# Patient Record
Sex: Female | Born: 1980
Health system: Southern US, Community
[De-identification: ages and names within clinical notes are randomized; demographics above are authoritative.]

## PROBLEM LIST (undated history)

## (undated) DIAGNOSIS — I10 Essential (primary) hypertension: Secondary | ICD-10-CM

## (undated) DIAGNOSIS — M1990 Unspecified osteoarthritis, unspecified site: Secondary | ICD-10-CM

## (undated) DIAGNOSIS — G4730 Sleep apnea, unspecified: Secondary | ICD-10-CM

## (undated) HISTORY — DX: Unspecified osteoarthritis, unspecified site: M19.90

---

## 2000-10-20 DIAGNOSIS — N879 Dysplasia of cervix uteri, unspecified: Secondary | ICD-10-CM | POA: Insufficient documentation

## 2003-02-17 DIAGNOSIS — D539 Nutritional anemia, unspecified: Secondary | ICD-10-CM | POA: Insufficient documentation

## 2003-02-17 DIAGNOSIS — E789 Disorder of lipoprotein metabolism, unspecified: Secondary | ICD-10-CM | POA: Insufficient documentation

## 2003-02-17 HISTORY — DX: Disorder of lipoprotein metabolism, unspecified: E78.9

## 2006-07-23 DIAGNOSIS — N949 Unspecified condition associated with female genital organs and menstrual cycle: Secondary | ICD-10-CM

## 2006-07-23 DIAGNOSIS — S339XXA Sprain of unspecified parts of lumbar spine and pelvis, initial encounter: Secondary | ICD-10-CM

## 2006-07-23 DIAGNOSIS — R87619 Unspecified abnormal cytological findings in specimens from cervix uteri: Secondary | ICD-10-CM

## 2006-07-23 HISTORY — DX: Sprain of unspecified parts of lumbar spine and pelvis, initial encounter: S33.9XXA

## 2006-07-23 HISTORY — DX: Unspecified condition associated with female genital organs and menstrual cycle: N94.9

## 2006-07-23 HISTORY — DX: Unspecified abnormal cytological findings in specimens from cervix uteri: R87.619

## 2008-03-24 HISTORY — PX: TUBAL LIGATION: SHX77

## 2015-02-26 DIAGNOSIS — I10 Essential (primary) hypertension: Secondary | ICD-10-CM | POA: Insufficient documentation

## 2015-09-05 DIAGNOSIS — M5432 Sciatica, left side: Secondary | ICD-10-CM

## 2015-09-05 DIAGNOSIS — M5430 Sciatica, unspecified side: Secondary | ICD-10-CM | POA: Insufficient documentation

## 2015-09-05 HISTORY — DX: Sciatica, left side: M54.32

## 2016-04-24 DIAGNOSIS — E559 Vitamin D deficiency, unspecified: Secondary | ICD-10-CM | POA: Insufficient documentation

## 2016-06-24 DIAGNOSIS — F330 Major depressive disorder, recurrent, mild: Secondary | ICD-10-CM

## 2016-06-24 HISTORY — DX: Major depressive disorder, recurrent, mild: F33.0

## 2016-07-31 DIAGNOSIS — M47817 Spondylosis without myelopathy or radiculopathy, lumbosacral region: Secondary | ICD-10-CM | POA: Insufficient documentation

## 2016-10-23 DIAGNOSIS — E782 Mixed hyperlipidemia: Secondary | ICD-10-CM | POA: Insufficient documentation

## 2017-05-07 DIAGNOSIS — F413 Other mixed anxiety disorders: Secondary | ICD-10-CM | POA: Insufficient documentation

## 2017-05-07 DIAGNOSIS — F411 Generalized anxiety disorder: Secondary | ICD-10-CM | POA: Insufficient documentation

## 2017-05-07 DIAGNOSIS — F419 Anxiety disorder, unspecified: Secondary | ICD-10-CM

## 2017-05-07 HISTORY — DX: Anxiety disorder, unspecified: F41.9

## 2018-11-24 DIAGNOSIS — M5136 Other intervertebral disc degeneration, lumbar region: Secondary | ICD-10-CM | POA: Insufficient documentation

## 2019-02-28 DIAGNOSIS — N921 Excessive and frequent menstruation with irregular cycle: Secondary | ICD-10-CM | POA: Insufficient documentation

## 2019-02-28 DIAGNOSIS — N920 Excessive and frequent menstruation with regular cycle: Secondary | ICD-10-CM

## 2019-02-28 HISTORY — DX: Excessive and frequent menstruation with regular cycle: N92.0

## 2019-04-12 DIAGNOSIS — D500 Iron deficiency anemia secondary to blood loss (chronic): Secondary | ICD-10-CM | POA: Insufficient documentation

## 2019-04-12 DIAGNOSIS — D219 Benign neoplasm of connective and other soft tissue, unspecified: Secondary | ICD-10-CM | POA: Insufficient documentation

## 2019-05-06 HISTORY — PX: MYOMECTOMY: SHX85

## 2019-08-29 DIAGNOSIS — D259 Leiomyoma of uterus, unspecified: Secondary | ICD-10-CM | POA: Insufficient documentation

## 2019-10-17 DIAGNOSIS — G8929 Other chronic pain: Secondary | ICD-10-CM

## 2019-10-17 DIAGNOSIS — M5442 Lumbago with sciatica, left side: Secondary | ICD-10-CM

## 2019-10-17 HISTORY — DX: Lumbago with sciatica, left side: M54.42

## 2019-10-17 HISTORY — DX: Other chronic pain: G89.29

## 2020-07-10 DIAGNOSIS — N946 Dysmenorrhea, unspecified: Secondary | ICD-10-CM | POA: Diagnosis not present

## 2020-07-10 DIAGNOSIS — B9689 Other specified bacterial agents as the cause of diseases classified elsewhere: Secondary | ICD-10-CM | POA: Diagnosis not present

## 2020-07-10 DIAGNOSIS — N938 Other specified abnormal uterine and vaginal bleeding: Secondary | ICD-10-CM | POA: Diagnosis not present

## 2020-07-10 DIAGNOSIS — N760 Acute vaginitis: Secondary | ICD-10-CM | POA: Diagnosis not present

## 2020-07-12 DIAGNOSIS — A549 Gonococcal infection, unspecified: Secondary | ICD-10-CM | POA: Diagnosis not present

## 2020-07-12 DIAGNOSIS — N730 Acute parametritis and pelvic cellulitis: Secondary | ICD-10-CM | POA: Diagnosis not present

## 2020-07-12 DIAGNOSIS — R1030 Lower abdominal pain, unspecified: Secondary | ICD-10-CM | POA: Diagnosis not present

## 2020-07-12 DIAGNOSIS — I10 Essential (primary) hypertension: Secondary | ICD-10-CM | POA: Diagnosis not present

## 2020-07-12 DIAGNOSIS — Z20822 Contact with and (suspected) exposure to covid-19: Secondary | ICD-10-CM | POA: Diagnosis not present

## 2020-07-12 DIAGNOSIS — R110 Nausea: Secondary | ICD-10-CM | POA: Diagnosis not present

## 2020-07-12 DIAGNOSIS — R109 Unspecified abdominal pain: Secondary | ICD-10-CM | POA: Diagnosis not present

## 2020-07-12 DIAGNOSIS — N739 Female pelvic inflammatory disease, unspecified: Secondary | ICD-10-CM | POA: Diagnosis not present

## 2020-07-12 HISTORY — DX: Acute parametritis and pelvic cellulitis: N73.0

## 2020-07-16 DIAGNOSIS — N888 Other specified noninflammatory disorders of cervix uteri: Secondary | ICD-10-CM | POA: Diagnosis not present

## 2020-07-16 DIAGNOSIS — N739 Female pelvic inflammatory disease, unspecified: Secondary | ICD-10-CM | POA: Diagnosis not present

## 2020-07-16 DIAGNOSIS — N730 Acute parametritis and pelvic cellulitis: Secondary | ICD-10-CM | POA: Diagnosis not present

## 2020-09-10 DIAGNOSIS — H1045 Other chronic allergic conjunctivitis: Secondary | ICD-10-CM | POA: Diagnosis not present

## 2020-09-10 DIAGNOSIS — H5210 Myopia, unspecified eye: Secondary | ICD-10-CM | POA: Diagnosis not present

## 2020-09-10 DIAGNOSIS — Z135 Encounter for screening for eye and ear disorders: Secondary | ICD-10-CM | POA: Diagnosis not present

## 2020-09-11 ENCOUNTER — Other Ambulatory Visit: Payer: Self-pay

## 2020-09-11 ENCOUNTER — Ambulatory Visit: Payer: 59 | Admitting: Adult Health

## 2020-09-11 VITALS — BP 120/80 | HR 68 | Temp 98.3°F | Wt 169.8 lb

## 2020-09-11 DIAGNOSIS — M545 Low back pain, unspecified: Secondary | ICD-10-CM

## 2020-09-11 MED ORDER — METHYLPREDNISOLONE ACETATE 80 MG/ML IJ SUSP
80.0000 mg | Freq: Once | INTRAMUSCULAR | Status: AC
Start: 2020-09-11 — End: 2020-09-11
  Administered 2020-09-11: 80 mg via INTRAMUSCULAR

## 2020-09-11 MED ORDER — CYCLOBENZAPRINE HCL 5 MG PO TABS: 5.0000 mg | ORAL_TABLET | Freq: Every day | ORAL | 0 refills | Status: DC

## 2020-09-11 NOTE — Progress Notes (Signed)
   Subjective:    Patient ID: Caitlyn Richardson, female    DOB: December 05, 1980, 40 y.o.   MRN: [id number]  HPI  40 year old female who  has no past medical history on file.  She presents to the office today for low back pain. Pain started this morning when bending over at work. She felt a stretching/tight sensation in her low back. This has happened in the past and received a steroid injection which helps. Change in positions causes pain to be worse   Review of Systems See HPI   History reviewed. No pertinent past medical history.  Social History   Socioeconomic History  . Marital status: Single    Spouse name: Not on file  . Number of children: Not on file  . Years of education: Not on file  . Highest education level: Not on file  Occupational History  . Not on file  Tobacco Use  . Smoking status: Not on file  . Smokeless tobacco: Not on file  Substance and Sexual Activity  . Alcohol use: Not on file  . Drug use: Not on file  . Sexual activity: Not on file  Other Topics Concern  . Not on file  Social History Narrative  . Not on file   Social Determinants of Health   Financial Resource Strain: Not on file  Food Insecurity: Not on file  Transportation Needs: Not on file  Physical Activity: Not on file  Stress: Not on file  Social Connections: Not on file  Intimate Partner Violence: Not on file    History reviewed. No pertinent surgical history.  History reviewed. No pertinent family history.  Not on File  No current outpatient medications on file prior to visit.   No current facility-administered medications on file prior to visit.    BP 120/80 (BP Location: Left Arm, Patient Position: Sitting, Cuff Size: Normal)   Pulse 68   Temp 98.3 F (36.8 C) (Oral)   Wt 169 lb 12.8 oz (77 kg)   SpO2 99%       Objective:   Physical Exam Vitals and nursing note reviewed.  Constitutional:      Appearance: Normal appearance.  Musculoskeletal:        General:  Tenderness (lower lumbar spine ) present.     Lumbar back: Spasms and tenderness present. No swelling, deformity or bony tenderness.  Skin:    General: Skin is warm and dry.  Neurological:     General: No focal deficit present.     Mental Status: She is alert and oriented to person, place, and time.       Assessment & Plan:  1. Acute midline low back pain without sciatica - appears muscular  - methylPREDNISolone acetate (DEPO-MEDROL) injection 80 mg - cyclobenzaprine (FLEXERIL) 5 MG tablet; Take 1 tablet (5 mg total) by mouth at bedtime.  Dispense: 15 tablet; Refill: 0 - Follow up if not resolved in the next 2-3 days   Dorothyann Peng, NP

## 2020-09-12 ENCOUNTER — Encounter: Payer: Self-pay | Admitting: Adult Health

## 2020-09-18 DIAGNOSIS — M5459 Other low back pain: Secondary | ICD-10-CM | POA: Diagnosis not present

## 2020-10-02 ENCOUNTER — Ambulatory Visit (HOSPITAL_BASED_OUTPATIENT_CLINIC_OR_DEPARTMENT_OTHER): Payer: 59 | Admitting: Nurse Practitioner

## 2020-10-09 ENCOUNTER — Other Ambulatory Visit (HOSPITAL_BASED_OUTPATIENT_CLINIC_OR_DEPARTMENT_OTHER): Payer: Self-pay | Admitting: Nurse Practitioner

## 2020-10-09 ENCOUNTER — Other Ambulatory Visit: Payer: Self-pay

## 2020-10-09 ENCOUNTER — Encounter (HOSPITAL_BASED_OUTPATIENT_CLINIC_OR_DEPARTMENT_OTHER): Payer: Self-pay | Admitting: Nurse Practitioner

## 2020-10-09 ENCOUNTER — Ambulatory Visit (HOSPITAL_BASED_OUTPATIENT_CLINIC_OR_DEPARTMENT_OTHER): Payer: 59 | Admitting: Nurse Practitioner

## 2020-10-09 VITALS — BP 158/80 | HR 55 | Ht 66.0 in | Wt 174.0 lb

## 2020-10-09 DIAGNOSIS — Z7689 Persons encountering health services in other specified circumstances: Secondary | ICD-10-CM | POA: Insufficient documentation

## 2020-10-09 DIAGNOSIS — R0789 Other chest pain: Secondary | ICD-10-CM

## 2020-10-09 DIAGNOSIS — E559 Vitamin D deficiency, unspecified: Secondary | ICD-10-CM

## 2020-10-09 DIAGNOSIS — D500 Iron deficiency anemia secondary to blood loss (chronic): Secondary | ICD-10-CM

## 2020-10-09 DIAGNOSIS — E789 Disorder of lipoprotein metabolism, unspecified: Secondary | ICD-10-CM | POA: Diagnosis not present

## 2020-10-09 DIAGNOSIS — F4323 Adjustment disorder with mixed anxiety and depressed mood: Secondary | ICD-10-CM | POA: Diagnosis not present

## 2020-10-09 DIAGNOSIS — M5430 Sciatica, unspecified side: Secondary | ICD-10-CM

## 2020-10-09 DIAGNOSIS — Z0000 Encounter for general adult medical examination without abnormal findings: Secondary | ICD-10-CM | POA: Diagnosis not present

## 2020-10-09 DIAGNOSIS — N920 Excessive and frequent menstruation with regular cycle: Secondary | ICD-10-CM

## 2020-10-09 DIAGNOSIS — Z113 Encounter for screening for infections with a predominantly sexual mode of transmission: Secondary | ICD-10-CM | POA: Insufficient documentation

## 2020-10-09 DIAGNOSIS — M47817 Spondylosis without myelopathy or radiculopathy, lumbosacral region: Secondary | ICD-10-CM

## 2020-10-09 DIAGNOSIS — E538 Deficiency of other specified B group vitamins: Secondary | ICD-10-CM | POA: Diagnosis not present

## 2020-10-09 DIAGNOSIS — F419 Anxiety disorder, unspecified: Secondary | ICD-10-CM | POA: Insufficient documentation

## 2020-10-09 DIAGNOSIS — Z6828 Body mass index (BMI) 28.0-28.9, adult: Secondary | ICD-10-CM | POA: Insufficient documentation

## 2020-10-09 DIAGNOSIS — F330 Major depressive disorder, recurrent, mild: Secondary | ICD-10-CM | POA: Insufficient documentation

## 2020-10-09 DIAGNOSIS — I10 Essential (primary) hypertension: Secondary | ICD-10-CM

## 2020-10-09 DIAGNOSIS — N921 Excessive and frequent menstruation with irregular cycle: Secondary | ICD-10-CM | POA: Insufficient documentation

## 2020-10-09 DIAGNOSIS — E782 Mixed hyperlipidemia: Secondary | ICD-10-CM

## 2020-10-09 DIAGNOSIS — Z862 Personal history of diseases of the blood and blood-forming organs and certain disorders involving the immune mechanism: Secondary | ICD-10-CM

## 2020-10-09 HISTORY — DX: Iron deficiency anemia secondary to blood loss (chronic): D50.0

## 2020-10-09 HISTORY — DX: Anxiety disorder, unspecified: F41.9

## 2020-10-09 HISTORY — DX: Adjustment disorder with mixed anxiety and depressed mood: F43.23

## 2020-10-09 HISTORY — DX: Excessive and frequent menstruation with irregular cycle: N92.1

## 2020-10-09 HISTORY — DX: Other chest pain: R07.89

## 2020-10-09 HISTORY — DX: Spondylosis without myelopathy or radiculopathy, lumbosacral region: M47.817

## 2020-10-09 HISTORY — DX: Sciatica, unspecified side: M54.30

## 2020-10-09 HISTORY — DX: Body mass index (BMI) 28.0-28.9, adult: Z68.28

## 2020-10-09 HISTORY — DX: Personal history of diseases of the blood and blood-forming organs and certain disorders involving the immune mechanism: Z86.2

## 2020-10-09 HISTORY — DX: Mixed hyperlipidemia: E78.2

## 2020-10-09 MED ORDER — HYDROXYZINE PAMOATE 25 MG PO CAPS
25.0000 mg | ORAL_CAPSULE | Freq: Every evening | ORAL | 5 refills | Status: DC | PRN
  Filled 2021-01-08: qty 60, 30d supply, fill #0

## 2020-10-09 MED ORDER — AMLODIPINE BESYLATE 5 MG PO TABS
5.0000 mg | ORAL_TABLET | Freq: Every day | ORAL | 3 refills | Status: DC
  Filled 2020-11-14: qty 90, 90d supply, fill #0
  Filled 2021-02-28: qty 90, 90d supply, fill #1
  Filled 2021-06-30: qty 90, 90d supply, fill #2

## 2020-10-09 MED ORDER — SERTRALINE HCL 50 MG PO TABS
ORAL_TABLET | ORAL | 3 refills | Status: DC
Start: 2020-10-09 — End: 2020-11-06

## 2020-10-09 MED ORDER — OLMESARTAN MEDOXOMIL-HCTZ 40-25 MG PO TABS
1.0000 | ORAL_TABLET | Freq: Every day | ORAL | 3 refills | Status: DC
  Filled 2020-11-14: qty 90, 90d supply, fill #0
  Filled 2021-02-28: qty 90, 90d supply, fill #1
  Filled 2021-06-30: qty 90, 90d supply, fill #2

## 2020-10-09 NOTE — Assessment & Plan Note (Signed)
Heavy menstrual bleeding patterns with reportedly normal cycle Hx of uterine fibroids with removal. Will monitor for anemia today Due for pap, as well- will place referral to GYN for evaluation and recommendations

## 2020-10-09 NOTE — Assessment & Plan Note (Signed)
Sx consistent with anxiety/depression related to current life stressors- consider exacerbation with hormonal changes as also contributing factor. Discussion of medication options provided today Decision to start sertraline 50mg  qHS and hydroxyzine 25-50mg  HS PRN for symptom management F/U in 4 weeks for evaluation of treatment or sooner if needed

## 2020-10-09 NOTE — Assessment & Plan Note (Signed)
BMI 28.08 today Recommendations for diet and exercise provided.  Will obtain labs for evaluation.

## 2020-10-09 NOTE — Assessment & Plan Note (Signed)
Historical dx- no meds and no recent labs Recommendations for diet and exercise provided Will review lipids today and make changes to plan of care as appropriate.

## 2020-10-09 NOTE — Assessment & Plan Note (Signed)
Historical with completion of 8 week vitamin d treatment in past Will obtain labs today for re-evaluation and make changes to plan of care as appropriate

## 2020-10-09 NOTE — Assessment & Plan Note (Signed)
Review of current and past medical history, social history, medication, and family history.  Review of care gaps and health maintenance recommendations.  Records from recent providers to be requested if not available in Chart Review or Care Everywhere.  Recommendations for health maintenance, diet, and exercise provided.   

## 2020-10-09 NOTE — Assessment & Plan Note (Signed)
Recent hx of gonorrhea with completion of treatment, but not test of cure Will perform test today and make changes to plan of care as appropriate No current symptoms and no new RF F/U if symptoms return or new symptoms develop

## 2020-10-09 NOTE — Assessment & Plan Note (Signed)
BP elevated today at 158/80- off medication for ~3 months Plan to restart benicar and amlodipine today- refills sent Will obtain labs for evaluation Monitor BP at home 2-3 times a week and report consistent readings >130/80 DASH dietary measures provided F/U in 6 months

## 2020-10-09 NOTE — Progress Notes (Signed)
[name], DNP, AGNP-c Primary Care Services ______________________________________________________________________________________________________________________________________________  HPI Caitlyn Richardson is a 40 y.o. female presenting to [facility] today to establish care.   Patient Care Team: Maybelline Kolarik, Coralee Pesa, NP as PCP - General (Nurse Practitioner)   Concerns today/Medical Conditions: . HTN o Long standing hx htn o Out of medication x ~97months o No CP, palpitations, headaches, shortness of breath, edema, cough o Intermittent blurriness to vision since being off medication . HLD o Historical o No medication o No sx . Mood  o Frequent mood swings and irritability with intermixed anxiety o Difficulty sleeping- CBD gummies help some o Feels short tempered with children o Lots of stresses- 42 y/o daughter, mother lives with her, 17 y/o daughter away at college, boyfriend travels for work . Menorrhagia o Hx of uterine fibroids with menorrhagia o Fibroid surgery in past o Bleeding through overnight pad and menstrual panties on regular basis o Interested in medical options o Needs pap  PHQ9 Today: Depression screen PHQ 2/9 10/09/2020  Decreased Interest 1  Down, Depressed, Hopeless 1  PHQ - 2 Score 2  Altered sleeping 1  Tired, decreased energy 1  Change in appetite 0  Feeling bad or failure about yourself  0  Trouble concentrating 1  Moving slowly or fidgety/restless 0  Suicidal thoughts 0  PHQ-9 Score 5  Difficult doing work/chores Not difficult at all   GAD7 Today: GAD 7 : Generalized Anxiety Score 10/09/2020  Nervous, Anxious, on Edge 0  Control/stop worrying 0  Worry too much - different things 0  Trouble relaxing 1  Restless 0  Easily annoyed or irritable 1  Afraid - awful might happen 0  Total GAD 7 Score 2  Anxiety Difficulty Not difficult at all    Health Maintenance Due  Topic Date Due  .  HIV Screening  Never done  . Hepatitis C Screening  Never done  . PAP SMEAR-Modifier  Never done   ROS All review of systems negative except what is listed in the HPI  PHYSICAL EXAM General Appearance:  awake, alert, oriented, in no acute distress and well developed, well nourished Skin:  skin color, texture, turgor are normal; there are no bruises, rashes or lesions. Head/face:  NCAT Eyes:  No gross abnormalities., PERRL, EOMI and Sclera nonicteric Ears:  canals and TMs NI Nose/Sinuses:  Nares normal. Septum midline. Mucosa normal. No drainage or sinus tenderness. Mouth/Throat:  Mucosa moist, no lesions; pharynx without erythema, edema or exudate. Neck:  no bruits, no jvd and neck supple, non-tender, thyromegaly with no palpable masses Back:  no pain to palpation, good flexion and extension, motor and sensory appear to be normal Lungs:  Normal expansion.  Clear to auscultation.  No rales, rhonchi, or wheezing., No chest wall tenderness., No kyphosis or scoliosis. Heart:  Heart sounds are normal.  Regular rate and rhythm without murmur, gallop or rub. Abdomen:  Soft, non-tender, normal bowel sounds; no bruits, organomegaly or masses. Musculoskeletal:  Range of motion normal in hips, knees, shoulders, and spine., No joint swelling, deformity, or tenderness. Peripheral Pulses:  Capillary refill <2secs, strong peripheral pulses Neurologic:  Alert and oriented x 3, gait normal., reflexes normal and symmetric, strength and  sensation grossly normal Psych exam:alert,oriented, in NAD with a full range of affect, normal behavior and no psychotic features  ASSESSMENT AND PLAN Problem List Items Addressed This Visit    Disorder of lipoid metabolism    Historical dx- no meds  and no recent labs Recommendations for diet and exercise provided Will review lipids today and make changes to plan of care as appropriate.        Relevant Orders   Lipid panel   Hypertension    BP elevated today at  158/80- off medication for ~3 months Plan to restart benicar and amlodipine today- refills sent Will obtain labs for evaluation Monitor BP at home 2-3 times a week and report consistent readings >130/80 DASH dietary measures provided F/U in 6 months      Relevant Medications   olmesartan-hydrochlorothiazide (BENICAR HCT) 40-25 MG tablet   amLODipine (NORVASC) 5 MG tablet   Other Relevant Orders   CBC with Differential/Platelet   Lipid panel   Hemoglobin A1c   TSH   Vitamin D deficiency    Historical with completion of 8 week vitamin d treatment in past Will obtain labs today for re-evaluation and make changes to plan of care as appropriate      Relevant Orders   VITAMIN D 25 Hydroxy (Vit-D Deficiency, Fractures)   Establishing care with new doctor, encounter for - Primary    Review of current and past medical history, social history, medication, and family history.  Review of care gaps and health maintenance recommendations.  Records from recent providers to be requested if not available in Chart Review or Care Everywhere.  Recommendations for health maintenance, diet, and exercise provided.        Adjustment reaction with anxiety and depression    Sx consistent with anxiety/depression related to current life stressors- consider exacerbation with hormonal changes as also contributing factor. Discussion of medication options provided today Decision to start sertraline 50mg  qHS and hydroxyzine 25-50mg  HS PRN for symptom management F/U in 4 weeks for evaluation of treatment or sooner if needed      Relevant Medications   sertraline (ZOLOFT) 50 MG tablet   hydrOXYzine (VISTARIL) 25 MG capsule   Other Relevant Orders   TSH   Routine screening for STI (sexually transmitted infection)    Recent hx of gonorrhea with completion of treatment, but not test of cure Will perform test today and make changes to plan of care as appropriate No current symptoms and no new RF F/U if  symptoms return or new symptoms develop       Relevant Orders   GC/Chlamydia Probe Amp   HIV antibody (with reflex)   Hepatitis C Antibody   Menorrhagia with regular cycle    Heavy menstrual bleeding patterns with reportedly normal cycle Hx of uterine fibroids with removal. Will monitor for anemia today Due for pap, as well- will place referral to GYN for evaluation and recommendations      Relevant Orders   CBC with Differential/Platelet   TSH   Ambulatory referral to Obstetrics / Gynecology   Body mass index 28.0-28.9, adult    BMI 28.08 today Recommendations for diet and exercise provided.  Will obtain labs for evaluation.      Relevant Orders   Comprehensive metabolic panel   Lipid panel   Hemoglobin A1c   TSH   Encounter for annual physical exam    CPE with no abnormal findings on exam Labs ordered today based on symptoms and historical dx Will obtain records for evaluation of completed HM and make recommendations  F/U in 1 year or sooner, if needed       Other Visit Diagnoses    Laboratory tests ordered as part of a complete physical exam (CPE)  Relevant Orders   CBC with Differential/Platelet   Comprehensive metabolic panel   Lipid panel   Hemoglobin A1c   GC/Chlamydia Probe Amp   HIV antibody (with reflex)   Hepatitis C Antibody   B12   VITAMIN D 25 Hydroxy (Vit-D Deficiency, Fractures)   TSH   Ambulatory referral to Obstetrics / Gynecology   Vitamin B 12 deficiency       Relevant Orders   B12      Education provided today during visit and on AVS for patient to review at home.  Diet and Exercise recommendations provided.  Current diagnoses and recommendations discussed. HM recommendations reviewed with recommendations.    Outpatient Encounter Medications as of 10/09/2020  Medication Sig  . acetaminophen (TYLENOL) 500 MG tablet Take by mouth.  . hydrOXYzine (VISTARIL) 25 MG capsule Take 1 capsule (25 mg total) by mouth at bedtime and may  repeat dose one time if needed.  . sertraline (ZOLOFT) 50 MG tablet Take 0.5 tablets (25 mg total) by mouth at bedtime for 7 days, THEN 1 tablet (50 mg total) at bedtime for 23 days.  . [DISCONTINUED] amLODipine (NORVASC) 5 MG tablet 1 tab(s)  . [DISCONTINUED] cyclobenzaprine (FLEXERIL) 5 MG tablet Take 1 tablet (5 mg total) by mouth at bedtime.  . [DISCONTINUED] olmesartan-hydrochlorothiazide (BENICAR HCT) 40-25 MG tablet Take 1 tablet by mouth daily.  Marland Kitchen amLODipine (NORVASC) 5 MG tablet Take 1 tablet (5 mg total) by mouth daily.  Marland Kitchen olmesartan-hydrochlorothiazide (BENICAR HCT) 40-25 MG tablet Take 1 tablet by mouth daily.   No facility-administered encounter medications on file as of 10/09/2020.    Return in about 4 weeks (around 11/06/2020) for mood- virtual.  Time: 60 minutes, >50% spent counseling, care coordination, chart review, and documentation.   Orma Render, DNP, AGNP-c

## 2020-10-09 NOTE — Patient Instructions (Addendum)
Recommendations from today's visit: . I have sent a referral for OB GYN for you . I have sent the sertraline and hydroxyzine for sleep and mood . I have sent the blood pressure medication in for you . We will f/u in 3-4 weeks with a virtual visit for mood  Information on diet, exercise, and health maintenance recommendations are listed below. This is information to help you be sure you are on track for optimal health and monitoring.   Please look over this and let us know if you have any questions or if you have completed any of the health maintenance outside of [facility] so that we can be sure your records are up to date.  ___________________________________________________________  Thank you for choosing [facility] for your Primary Care needs. I am excited for the opportunity to partner with you to meet your health care goals. It was a pleasure meeting you today!  I am an Adult-Geriatric Nurse Practitioner with a background in caring for patients for more than 20 years. I received my Paediatric nurse in Nursing and my Doctor of Nursing Practice degrees at [employer]. I received additional fellowship training in primary care and sports medicine after receiving my doctorate degree. I provide primary care and sports medicine services to patients age 84 and older within this office. I am the director of the APP Fellowship with [facility].  I am a [place] native, but have called the [place] area home for nearly 20 years and am proud to be a member of this community.   I am passionate about providing the best service to you through preventive medicine and supportive care. I consider you a part of the medical team and value your input. I work diligently to ensure that you are heard and your needs are met in a safe and effective manner. I want you to feel comfortable with me as your provider and want you to know that your health concerns are important to  me.   For your information, our office hours are Monday- Friday 8:00 AM - 5:00 PM At this time I am not in the office on Wednesdays.  If you have questions or concerns, please call our office at [phone] or send [place] a MyChart message and we will respond as quickly as possible.   For all urgent or time sensitive needs we ask that you please call the office to avoid delays. MyChart is not constantly monitored and replies may take up to 72 business hours.  MyChart Policy: . MyChart allows for you to see your visit notes, after visit summary, provider recommendations, lab and tests results, make an appointment, request refills, and contact your provider or the office for non-urgent questions or concerns.  . Providers are seeing patients during normal business hours and do not have built in time to review MyChart messages. We ask that you allow a minimum of 72 business hours for MyChart message responses.  . Complex MyChart concerns may require a visit. Your provider may request you schedule a virtual or in person visit to ensure we are providing the best care possible. . MyChart messages sent after 4:00 PM on Friday will not be received by the provider until Monday morning.    Lab and Test Results: . You will receive your lab and test results on MyChart as soon as they are completed and results have been sent by the lab or testing facility. Due to this service, you will receive  your results BEFORE your provider.  . Please allow a minimum of 72 business hours for your provider to receive and review lab and test results and contact you about.   . Most lab and test result comments from the provider will be sent through [place]. Your provider may recommend changes to the plan of care, follow-up visits, repeat testing, ask questions, or request an office visit to discuss these results. You may reply directly to this message or call the office at [phone] to provide information for the provider or  set up an appointment. . In some instances, you will be called with test results and recommendations. Please let us know if this is preferred and we will make note of this in your chart to provide this for you.    . If you have not heard a response to your lab or test results in 72 business hours, please call the office to let us know.   After Hours: . For all non-emergency after hours needs, please call the office at [phone] and select the option to reach the on-call provider service. On-call services are shared between multiple [facility] offices and therefore it will not be possible to speak directly with your provider. On-call providers may provide medical advice and recommendations, but are unable to provide refills for maintenance medications.  . For all emergency or urgent medical needs after normal business hours, we recommend that you seek care at the closest Urgent Care or Emergency Department to ensure appropriate treatment in a timely manner.  [facility] has a 24 hour emergency room located on the ground floor for your convenience.    Please do not hesitate to reach out to [place] with concerns.   Thank you, again, for choosing me as your health care partner. I appreciate your trust and look forward to learning more about you.   [name], DNP, AGNP-c ___________________________________________________________  Health Maintenance Recommendations Screening Testing  Mammogram  Every 1 -2 years based on history and risk factors  Starting at age 67  Pap Smear  Ages 21-39 every 3 years  Ages 28-65 every 5 years with HPV testing  More frequent testing may be required based on results and history  Colon Cancer Screening  Every 1-10 years based on test performed, risk factors, and history  Starting at age 70  Bone Density Screening  Every 2-10 years based on history  Starting at age 52 for women  Recommendations for men differ based on  medication usage, history, and risk factors  AAA Screening  One time ultrasound  Men 42-61 years old who have every smoked  Lung Cancer Screening  Low Dose Lung CT every 12 months  Age 21-80 years with a 30 pack-year smoking history who still smoke or who have quit within the last 15 years  Screening Labs  Routine  Labs: Complete Blood Count (CBC), Complete Metabolic Panel (CMP), Cholesterol (Lipid Panel)  Every 6-12 months based on history and medications  May be recommended more frequently based on current conditions or previous results  Hemoglobin A1c Lab  Every 3-12 months based on history and previous results  Starting at age 58 or earlier with diagnosis of diabetes, high cholesterol, BMI >26, and/or risk factors  Frequent monitoring for patients with diabetes to ensure blood sugar control  Thyroid Panel (TSH w/ T3 & T4)  Every 6 months based on history, symptoms, and risk factors  May be repeated more often if on medication  HIV  One time testing for all patients 53 and older  May be repeated more frequently for patients with increased risk factors or exposure  Hepatitis C  One time testing for all patients 106 and older  May be repeated more frequently for patients with increased risk factors or exposure  Gonorrhea, Chlamydia  Every 12 months for all sexually active persons 13-24 years  Additional monitoring may be recommended for those who are considered high risk or who have symptoms  PSA  Men 87-65 years old with risk factors  Additional screening may be recommended from age 60-69 based on risk factors, symptoms, and history  Vaccine Recommendations  Tetanus Booster  All adults every 10 years  Flu Vaccine  All patients 6 months and older every year  COVID Vaccine  All patients 12 years and older  Initial dosing with booster  May recommend additional booster based on age and health history  HPV Vaccine  2 doses all patients age  76-26  Dosing may be considered for patients over 26  Shingles Vaccine (Shingrix)  2 doses all adults 22 years and older  Pneumonia (Pneumovax 23)  All adults 55 years and older  May recommend earlier dosing based on health history  Pneumonia (Prevnar 23)  All adults 76 years and older  Dosed 1 year after Pneumovax 23  Additional Screening, Testing, and Vaccinations may be recommended on an individualized basis based on family history, health history, risk factors, and/or exposure.  __________________________________________________________  Diet Recommendations for All Patients  I recommend that all patients maintain a diet low in saturated fats, carbohydrates, and cholesterol. While this can be challenging at first, it is not impossible and small changes can make big differences.  Things to try: Marland Kitchen Decreasing the amount of soda, sweet tea, and/or juice to one or less per day and replace with water o While water is always the first choice, if you do not like water you may consider - adding a water additive without sugar to improve the taste - other sugar free drinks . Replace potatoes with a brightly colored vegetable at dinner . Use healthy oils, such as canola oil or olive oil, instead of butter or hard margarine . Limit your bread intake to two pieces or less a day . Replace regular pasta with low carb pasta options . Bake, broil, or grill foods instead of frying . Monitor portion sizes  . Eat smaller, more frequent meals throughout the day instead of large meals  An important thing to remember is, if you love foods that are not great for your health, you don't have to give them up completely. Instead, allow these foods to be a reward when you have done well. Allowing yourself to still have special treats every once in a while is a nice way to tell yourself thank you for working hard to keep yourself healthy.   Also remember that every day is a new day. If you have a bad  day and "fall off the wagon", you can still climb right back up and keep moving along on your journey!  We have resources available to help you!  Some websites that may be helpful include: . www.http://carter.biz/  . Www.VeryWellFit.com _____________________________________________________________  Activity Recommendations for All Patients  I recommend that all adults get at least 20 minutes of moderate physical activity that elevates your heart rate at least 5 days out of the week.  Some examples include: . Walking or jogging at a pace that allows you to  carry on a conversation . Cycling (stationary bike or outdoors) . Water aerobics . Yoga . Weight lifting . Dancing If physical limitations prevent you from putting stress on your joints, exercise in a pool or seated in a chair are excellent options.  Do determine your MAXIMUM heart rate for activity: YOUR AGE - 220 = MAX HeartRate   Remember! . Do not push yourself too hard.  . Start slowly and build up your pace, speed, weight, time in exercise, etc.  . Allow your body to rest between exercise and get good sleep. . You will need more water than normal when you are exerting yourself. Do not wait until you are thirsty to drink. Drink with a purpose of getting in at least 8, 8 ounce glasses of water a day plus more depending on how much you exercise and sweat.    If you begin to develop dizziness, chest pain, abdominal pain, jaw pain, shortness of breath, headache, vision changes, lightheadedness, or other concerning symptoms, stop the activity and allow your body to rest. If your symptoms are severe, seek emergency evaluation immediately. If your symptoms are concerning, but not severe, please let us know so that we can recommend further evaluation.   ________________________________________________________________   Managing Your Hypertension Hypertension, also called high blood pressure, is when the force of the blood pressing against  the walls of the arteries is too strong. Arteries are blood vessels that carry blood from your heart throughout your body. Hypertension forces the heart to work harder to pump blood and may cause the arteries to become narrow or stiff. Understanding blood pressure readings Your personal target blood pressure may vary depending on your medical conditions, your age, and other factors. A blood pressure reading includes a higher number over a lower number. Ideally, your blood pressure should be below 120/80. You should know that:  The first, or top, number is called the systolic pressure. It is a measure of the pressure in your arteries as your heart beats.  The second, or bottom number, is called the diastolic pressure. It is a measure of the pressure in your arteries as the heart relaxes. Blood pressure is classified into four stages. Based on your blood pressure reading, your health care provider may use the following stages to determine what type of treatment you need, if any. Systolic pressure and diastolic pressure are measured in a unit called mmHg. Normal  Systolic pressure: below 774.  Diastolic pressure: below 80. Elevated  Systolic pressure: 142-395.  Diastolic pressure: below 80. Hypertension stage 1  Systolic pressure: 320-233.  Diastolic pressure: 43-56. Hypertension stage 2  Systolic pressure: 861 or above.  Diastolic pressure: 90 or above. How can this condition affect me? Managing your hypertension is an important responsibility. Over time, hypertension can damage the arteries and decrease blood flow to important parts of the body, including the brain, heart, and kidneys. Having untreated or uncontrolled hypertension can lead to:  A heart attack.  A stroke.  A weakened blood vessel (aneurysm).  Heart failure.  Kidney damage.  Eye damage.  Metabolic syndrome.  Memory and concentration problems.  Vascular dementia. What actions can I take to manage this  condition? Hypertension can be managed by making lifestyle changes and possibly by taking medicines. Your health care provider will help you make a plan to bring your blood pressure within a normal range. Nutrition  Eat a diet that is high in fiber and potassium, and low in salt (sodium), added sugar, and fat.  An example eating plan is called the Dietary Approaches to Stop Hypertension (DASH) diet. To eat this way: ? Eat plenty of fresh fruits and vegetables. Try to fill one-half of your plate at each meal with fruits and vegetables. ? Eat whole grains, such as whole-wheat pasta, brown rice, or whole-grain bread. Fill about one-fourth of your plate with whole grains. ? Eat low-fat dairy products. ? Avoid fatty cuts of meat, processed or cured meats, and poultry with skin. Fill about one-fourth of your plate with lean proteins such as fish, chicken without skin, beans, eggs, and tofu. ? Avoid pre-made and processed foods. These tend to be higher in sodium, added sugar, and fat.  Reduce your daily sodium intake. Most people with hypertension should eat less than 1,500 mg of sodium a day.   Lifestyle  Work with your health care provider to maintain a healthy body weight or to lose weight. Ask what an ideal weight is for you.  Get at least 30 minutes of exercise that causes your heart to beat faster (aerobic exercise) most days of the week. Activities may include walking, swimming, or biking.  Include exercise to strengthen your muscles (resistance exercise), such as weight lifting, as part of your weekly exercise routine. Try to do these types of exercises for 30 minutes at least 3 days a week.  Do not use any products that contain nicotine or tobacco, such as cigarettes, e-cigarettes, and chewing tobacco. If you need help quitting, ask your health care provider.  Control any long-term (chronic) conditions you have, such as high cholesterol or diabetes.  Identify your sources of stress and find  ways to manage stress. This may include meditation, deep breathing, or making time for fun activities.   Alcohol use  Do not drink alcohol if: ? Your health care provider tells you not to drink. ? You are pregnant, may be pregnant, or are planning to become pregnant.  If you drink alcohol: ? Limit how much you use to:  0-1 drink a day for women.  0-2 drinks a day for men. ? Be aware of how much alcohol is in your drink. In the [place] one drink equals one 12 oz bottle of beer (355 mL), one 5 oz glass of wine (148 mL), or one 1 oz glass of hard liquor (44 mL). Medicines Your health care provider may prescribe medicine if lifestyle changes are not enough to get your blood pressure under control and if:  Your systolic blood pressure is 130 or higher.  Your diastolic blood pressure is 80 or higher. Take medicines only as told by your health care provider. Follow the directions carefully. Blood pressure medicines must be taken as told by your health care provider. The medicine does not work as well when you skip doses. Skipping doses also puts you at risk for problems. Monitoring Before you monitor your blood pressure:  Do not smoke, drink caffeinated beverages, or exercise within 30 minutes before taking a measurement.  Use the bathroom and empty your bladder (urinate).  Sit quietly for at least 5 minutes before taking measurements. Monitor your blood pressure at home as told by your health care provider. To do this:  Sit with your back straight and supported.  Place your feet flat on the floor. Do not cross your legs.  Support your arm on a flat surface, such as a table. Make sure your upper arm is at heart level.  Each time you measure, take two or three readings one minute  apart and record the results. You may also need to have your blood pressure checked regularly by your health care provider.   General information  Talk with your health care provider about your diet, exercise  habits, and other lifestyle factors that may be contributing to hypertension.  Review all the medicines you take with your health care provider because there may be side effects or interactions.  Keep all visits as told by your health care provider. Your health care provider can help you create and adjust your plan for managing your high blood pressure. Where to find more information  National Heart, Lung, and Blood Institute: https://wilson-eaton.com/  American Heart Association: www.heart.org Contact a health care provider if:  You think you are having a reaction to medicines you have taken.  You have repeated (recurrent) headaches.  You feel dizzy.  You have swelling in your ankles.  You have trouble with your vision. Get help right away if:  You develop a severe headache or confusion.  You have unusual weakness or numbness, or you feel faint.  You have severe pain in your chest or abdomen.  You vomit repeatedly.  You have trouble breathing. These symptoms may represent a serious problem that is an emergency. Do not wait to see if the symptoms will go away. Get medical help right away. Call your local emergency services (911 in the [place] Do not drive yourself to the hospital. Summary  Hypertension is when the force of blood pumping through your arteries is too strong. If this condition is not controlled, it may put you at risk for serious complications.  Your personal target blood pressure may vary depending on your medical conditions, your age, and other factors. For most people, a normal blood pressure is less than 120/80.  Hypertension is managed by lifestyle changes, medicines, or both.  Lifestyle changes to help manage hypertension include losing weight, eating a healthy, low-sodium diet, exercising more, stopping smoking, and limiting alcohol. This information is not intended to replace advice given to you by your health care provider. Make sure you discuss any questions  you have with your health care provider. Document Revised: 05/27/2019 Document Reviewed: 03/22/2019 Elsevier Patient Education  2021 [employer].

## 2020-10-09 NOTE — Addendum Note (Signed)
Addended by: Annaliah Rivenbark, Clarise Cruz E on: 10/09/2020 06:59 PM   Modules accepted: Level of Service

## 2020-10-09 NOTE — Assessment & Plan Note (Signed)
CPE with no abnormal findings on exam Labs ordered today based on symptoms and historical dx Will obtain records for evaluation of completed HM and make recommendations  F/U in 1 year or sooner, if needed

## 2020-10-10 LAB — CBC WITH DIFFERENTIAL/PLATELET
Basophils Absolute: 0 10*3/uL (ref 0.0–0.2)
Basos: 0 %
EOS (ABSOLUTE): 0 10*3/uL (ref 0.0–0.4)
Eos: 1 %
Hematocrit: 28 % — ABNORMAL LOW (ref 34.0–46.6)
Hemoglobin: 8.3 g/dL — ABNORMAL LOW (ref 11.1–15.9)
Immature Grans (Abs): 0 10*3/uL (ref 0.0–0.1)
Immature Granulocytes: 0 %
Lymphocytes Absolute: 1.7 10*3/uL (ref 0.7–3.1)
Lymphs: 22 %
MCH: 21.7 pg — ABNORMAL LOW (ref 26.6–33.0)
MCHC: 29.6 g/dL — ABNORMAL LOW (ref 31.5–35.7)
MCV: 73 fL — ABNORMAL LOW (ref 79–97)
Monocytes Absolute: 0.3 10*3/uL (ref 0.1–0.9)
Monocytes: 4 %
Neutrophils Absolute: 5.7 10*3/uL (ref 1.4–7.0)
Neutrophils: 73 %
Platelets: 329 10*3/uL (ref 150–450)
RBC: 3.82 x10E6/uL (ref 3.77–5.28)
RDW: 16.2 % — ABNORMAL HIGH (ref 11.7–15.4)
WBC: 7.7 10*3/uL (ref 3.4–10.8)

## 2020-10-10 LAB — COMPREHENSIVE METABOLIC PANEL
ALT: 11 IU/L (ref 0–32)
AST: 10 IU/L (ref 0–40)
Albumin/Globulin Ratio: 1.5 (ref 1.2–2.2)
Albumin: 4.5 g/dL (ref 3.8–4.8)
Alkaline Phosphatase: 62 IU/L (ref 44–121)
BUN/Creatinine Ratio: 15 (ref 9–23)
BUN: 11 mg/dL (ref 6–24)
Bilirubin Total: 0.5 mg/dL (ref 0.0–1.2)
CO2: 21 mmol/L (ref 20–29)
Calcium: 9.7 mg/dL (ref 8.7–10.2)
Chloride: 104 mmol/L (ref 96–106)
Creatinine, Ser: 0.75 mg/dL (ref 0.57–1.00)
Globulin, Total: 3 g/dL (ref 1.5–4.5)
Glucose: 117 mg/dL — ABNORMAL HIGH (ref 65–99)
Potassium: 3.7 mmol/L (ref 3.5–5.2)
Sodium: 144 mmol/L (ref 134–144)
Total Protein: 7.5 g/dL (ref 6.0–8.5)
eGFR: 103 mL/min/{1.73_m2} (ref >59)

## 2020-10-10 LAB — HEMOGLOBIN A1C
Est. average glucose Bld gHb Est-mCnc: 111 mg/dL
Hgb A1c MFr Bld: 5.5 % (ref 4.8–5.6)

## 2020-10-10 LAB — VITAMIN B12: Vitamin B-12: 467 pg/mL (ref 232–1245)

## 2020-10-10 LAB — LIPID PANEL
Chol/HDL Ratio: 4.4 ratio (ref 0.0–4.4)
Cholesterol, Total: 196 mg/dL (ref 100–199)
HDL: 45 mg/dL (ref >39)
LDL Chol Calc (NIH): 137 mg/dL — ABNORMAL HIGH (ref 0–99)
Triglycerides: 79 mg/dL (ref 0–149)
VLDL Cholesterol Cal: 14 mg/dL (ref 5–40)

## 2020-10-10 LAB — TSH: TSH: 2.46 u[IU]/mL (ref 0.450–4.500)

## 2020-10-10 LAB — HIV ANTIBODY (ROUTINE TESTING W REFLEX): HIV Screen 4th Generation wRfx: NONREACTIVE

## 2020-10-10 LAB — HEPATITIS C ANTIBODY: Hep C Virus Ab: <0.1 s/co ratio (ref 0.0–0.9)

## 2020-10-10 LAB — VITAMIN D 25 HYDROXY (VIT D DEFICIENCY, FRACTURES): Vit D, 25-Hydroxy: 28.2 ng/mL — ABNORMAL LOW (ref 30.0–100.0)

## 2020-10-11 LAB — GC/CHLAMYDIA PROBE AMP
Chlamydia trachomatis, NAA: NEGATIVE
Neisseria Gonorrhoeae by PCR: NEGATIVE

## 2020-10-15 NOTE — Addendum Note (Signed)
Addended by: Devina Bezold, Clarise Cruz E on: 10/15/2020 07:43 AM   Modules accepted: Orders

## 2020-10-15 NOTE — Progress Notes (Signed)
Chlamydia and gonorrhea negative.

## 2020-10-15 NOTE — Progress Notes (Signed)
Hemoglobin 8.3 with signs of significant anemia. Most likely due to excessive uterine bleeding. Referral has been placed with GYN. Recommend referral to Hem/Onc for iron infusions while decision is made for treatment.   Vitamin D is low- recommend calcium and vitamin D3 supplementation- Ca 1000mg  and VitD3 800 iu daily with OTC supplement.   Slight bump in LDL- recommend decrease saturated fats in diet. Will work on increased activity once we resolve anemia.   Please call patient and let her know of her anemia and that I am referring her to hematology for iron infusions.

## 2020-10-16 ENCOUNTER — Other Ambulatory Visit: Payer: Self-pay | Admitting: *Deleted

## 2020-10-16 ENCOUNTER — Telehealth (HOSPITAL_BASED_OUTPATIENT_CLINIC_OR_DEPARTMENT_OTHER): Payer: Self-pay

## 2020-10-16 DIAGNOSIS — D539 Nutritional anemia, unspecified: Secondary | ICD-10-CM

## 2020-10-16 NOTE — Telephone Encounter (Signed)
-----   Message from Orma Render, NP sent at 10/15/2020  7:42 AM EDT ----- Hemoglobin 8.3 with signs of significant anemia. Most likely due to excessive uterine bleeding. Referral has been placed with GYN. Recommend referral to Hem/Onc for iron infusions while decision is made for treatment.   Vitamin D is low- recommend calcium and vitamin D3 supplementation- Ca 1000mg  and VitD3 800 iu daily with OTC supplement.   Slight bump in LDL- recommend decrease saturated fats in diet. Will work on increased activity once we resolve anemia.   Please call patient and let her know of her anemia and that I am referring her to hematology for iron infusions.

## 2020-10-16 NOTE — Telephone Encounter (Signed)
Call patient to discuss lab results and recommendations.  She is aware and agrees the recommendations.  Instructed her to contact the office with any questions or concerns.

## 2020-10-16 NOTE — Progress Notes (Signed)
Received referral for IDA. Will schedule to be seen on 11/02/20 with lab couple days prior if she agrees, otherwise will draw labs on 7/1. New patient scheduler notified to schedule w/NP at 11:15 pm.

## 2020-10-17 ENCOUNTER — Telehealth: Payer: Self-pay | Admitting: Nurse Practitioner

## 2020-10-17 ENCOUNTER — Encounter: Payer: Self-pay | Admitting: Nurse Practitioner

## 2020-10-17 NOTE — Telephone Encounter (Signed)
Scheduled appts per 6/13 referral. Called pt, no answer. Left msg with appt date and time.

## 2020-10-31 ENCOUNTER — Other Ambulatory Visit: Payer: Self-pay

## 2020-10-31 ENCOUNTER — Inpatient Hospital Stay: Payer: 59 | Attending: Oncology

## 2020-10-31 DIAGNOSIS — D539 Nutritional anemia, unspecified: Secondary | ICD-10-CM | POA: Diagnosis not present

## 2020-10-31 LAB — CBC WITH DIFFERENTIAL (CANCER CENTER ONLY)
Abs Immature Granulocytes: 0.01 10*3/uL (ref 0.00–0.07)
Basophils Absolute: 0.1 10*3/uL (ref 0.0–0.1)
Basophils Relative: 1 %
Eosinophils Absolute: 0.1 10*3/uL (ref 0.0–0.5)
Eosinophils Relative: 1 %
HCT: 31.3 % — ABNORMAL LOW (ref 36.0–46.0)
Hemoglobin: 9.2 g/dL — ABNORMAL LOW (ref 12.0–15.0)
Immature Granulocytes: 0 %
Lymphocytes Relative: 24 %
Lymphs Abs: 1.6 10*3/uL (ref 0.7–4.0)
MCH: 21.4 pg — ABNORMAL LOW (ref 26.0–34.0)
MCHC: 29.4 g/dL — ABNORMAL LOW (ref 30.0–36.0)
MCV: 72.8 fL — ABNORMAL LOW (ref 80.0–100.0)
Monocytes Absolute: 0.6 10*3/uL (ref 0.1–1.0)
Monocytes Relative: 9 %
Neutro Abs: 4.5 10*3/uL (ref 1.7–7.7)
Neutrophils Relative %: 65 %
Platelet Count: 434 10*3/uL — ABNORMAL HIGH (ref 150–400)
RBC: 4.3 MIL/uL (ref 3.87–5.11)
RDW: 17.9 % — ABNORMAL HIGH (ref 11.5–15.5)
WBC Count: 6.9 10*3/uL (ref 4.0–10.5)
nRBC: 0 % (ref 0.0–0.2)

## 2020-10-31 LAB — FERRITIN: Ferritin: 3 ng/mL — ABNORMAL LOW (ref 11–307)

## 2020-10-31 LAB — SAVE SMEAR(SSMR), FOR PROVIDER SLIDE REVIEW

## 2020-11-02 ENCOUNTER — Encounter: Payer: Self-pay | Admitting: Nurse Practitioner

## 2020-11-02 ENCOUNTER — Other Ambulatory Visit: Payer: Self-pay

## 2020-11-02 ENCOUNTER — Inpatient Hospital Stay: Payer: 59 | Attending: Nurse Practitioner | Admitting: Nurse Practitioner

## 2020-11-02 ENCOUNTER — Inpatient Hospital Stay: Payer: 59

## 2020-11-02 VITALS — BP 135/78 | HR 66 | Temp 98.2°F | Resp 18 | Ht 66.0 in | Wt 168.4 lb

## 2020-11-02 DIAGNOSIS — Z79899 Other long term (current) drug therapy: Secondary | ICD-10-CM | POA: Diagnosis not present

## 2020-11-02 DIAGNOSIS — E782 Mixed hyperlipidemia: Secondary | ICD-10-CM | POA: Insufficient documentation

## 2020-11-02 DIAGNOSIS — N920 Excessive and frequent menstruation with regular cycle: Secondary | ICD-10-CM | POA: Insufficient documentation

## 2020-11-02 DIAGNOSIS — I10 Essential (primary) hypertension: Secondary | ICD-10-CM | POA: Insufficient documentation

## 2020-11-02 DIAGNOSIS — Z8349 Family history of other endocrine, nutritional and metabolic diseases: Secondary | ICD-10-CM | POA: Diagnosis not present

## 2020-11-02 DIAGNOSIS — Z8249 Family history of ischemic heart disease and other diseases of the circulatory system: Secondary | ICD-10-CM | POA: Insufficient documentation

## 2020-11-02 DIAGNOSIS — D539 Nutritional anemia, unspecified: Secondary | ICD-10-CM

## 2020-11-02 DIAGNOSIS — F419 Anxiety disorder, unspecified: Secondary | ICD-10-CM | POA: Diagnosis not present

## 2020-11-02 DIAGNOSIS — Z86018 Personal history of other benign neoplasm: Secondary | ICD-10-CM | POA: Diagnosis not present

## 2020-11-02 DIAGNOSIS — D500 Iron deficiency anemia secondary to blood loss (chronic): Secondary | ICD-10-CM | POA: Diagnosis not present

## 2020-11-02 LAB — URINALYSIS, COMPLETE (UACMP) WITH MICROSCOPIC
Bilirubin Urine: NEGATIVE
Glucose, UA: NEGATIVE mg/dL
Hgb urine dipstick: NEGATIVE
Ketones, ur: NEGATIVE mg/dL
Leukocytes,Ua: NEGATIVE
Nitrite: NEGATIVE
Protein, ur: NEGATIVE mg/dL
Specific Gravity, Urine: 1.017 (ref 1.005–1.030)
pH: 7.5 (ref 5.0–8.0)

## 2020-11-02 NOTE — Progress Notes (Signed)
New Hematology/Oncology Consult   Requesting MD: Marnee Spring, NP  [phone]    Reason for Consult: Anemia  HPI: Ms. Hauck is a 40 year old woman referred for evaluation of anemia.  CBC done 10/09/2020 returned with a hemoglobin of 8.3, MCV 73, normal white count and normal platelet count.  The anemia was felt to likely be due to excessive uterine bleeding.  She was referred to gynecology and hematology.  She reports heavy menstrual cycles for the past 2-1/2 years.  A cycle lasts 4 to 6 days.  Days 2 and 3 are "extremely heavy".  She passes "clots".  She reports she was found to have a uterine fibroid last year and underwent a myomectomy procedure in [place].  There was no improvement in the bleeding.  She denies other bleeding, specifically no black stools and no hematuria.   Past Medical History:  Diagnosis Date   Anxiety disorder 10/09/2020   Chronic left-sided low back pain with left-sided sciatica 10/17/2019   Excessive and frequent menstruation with irregular cycle 10/09/2020   Female genital symptoms 07/23/2006   Formatting of this note might be different from the original. ICD10 Conversion   Iron deficiency anemia due to chronic blood loss 10/09/2020   Lumbosacral spondylosis without myelopathy 10/09/2020   Mixed hyperlipidemia 10/09/2020   Other chest pain 10/09/2020   Sciatica 10/09/2020  :  History reviewed. No pertinent surgical history.:   Current Outpatient Medications:    acetaminophen (TYLENOL) 500 MG tablet, Take by mouth., Disp: , Rfl:    amLODipine (NORVASC) 5 MG tablet, Take 1 tablet (5 mg total) by mouth daily., Disp: 90 tablet, Rfl: 3   hydrOXYzine (VISTARIL) 25 MG capsule, Take 1 capsule (25 mg total) by mouth at bedtime and may repeat dose one time if needed., Disp: 60 capsule, Rfl: 5   olmesartan-hydrochlorothiazide (BENICAR HCT) 40-25 MG tablet, Take 1 tablet by mouth daily., Disp: 90 tablet, Rfl: 3   sertraline (ZOLOFT) 50 MG tablet, Take 0.5 tablets (25 mg  total) by mouth at bedtime for 7 days, THEN 1 tablet (50 mg total) at bedtime for 23 days., Disp: 30 tablet, Rfl: 3:   FH: No family history of malignancy.  Mother has thyroid issues and hypertension.  She is not aware of her father's past medical history.  A half sister died of "anemia".  SOCIAL HISTORY: She lives in [place].  She is single.  She has 2 children.  She works as a Technical brewer.  No tobacco use.  Occasional alcohol intake.  She has never had a blood transfusion.  Review of systems: She eats a normal diet.  She does not crave ice.  No fevers or sweats.  No weight loss.  She has a good appetite.  She feels tired.  No shortness of breath.  No nausea or vomiting.  No dysphagia.  No blood with bowel movements.  No blood in the urine.  She denies abdominal pain.  Physical Exam:  Blood pressure 135/78, pulse 66, temperature 98.2 F (36.8 C), temperature source Oral, resp. rate 18, height 5\' 6"  (1.676 m), weight 168 lb 6.4 oz (76.4 kg), SpO2 100 %.  HEENT: Oropharynx without thrush or ulcers.  Several missing teeth. Lungs: Lungs clear bilaterally. Cardiac: Regular rate and rhythm. Abdomen: Abdomen soft and nontender.  No hepatosplenomegaly. Vascular: No leg edema. Lymph nodes: Question tiny left submandibular lymph node versus prominent submandibular gland.  No palpable supraclavicular or axillary lymph nodes. Neurologic: Alert and oriented.  LABS:   Recent Labs  10/31/20 1204  WBC 6.9  HGB 9.2*  HCT 31.3*  PLT 434*   Ferritin 3  Review of peripheral blood smear: #1 WBCs-morphology unremarkable; #2 platelets-platelets normal in number; #3 RBCs-numerous ovalocytes, RBCs hypochromic, polychromasia not increased, few teardrops, acanthocytes and microcytes  RADIOLOGY:  No results found.  Assessment and Plan:   Microcytic anemia most likely secondary to menstrual bleeding and iron deficiency Menorrhagia, history of uterine fibroids, status post myomectomy Hypertension  Ms.  Ardelean is a 40 year old woman referred for evaluation of a microcytic anemia in the setting of menorrhagia.  Hematologic indices consistent with a diagnosis of iron deficiency.  Ferritin is 3.  She has been referred to gynecology.  She will complete stool cards and submit a urine specimen for UA.  She will begin ferrous sulfate 325 mg twice daily, increase to 3 times daily pending tolerance.  Plan follow-up CBC and ferritin in 3 weeks.  We will see her in follow-up in approximately 6 weeks.  Patient seen with Dr. Benay Spice.   Ned Card, NP 11/02/2020, 12:39 PM   This was a shared visit with Ned Card.  Ms. Gamero was interviewed and examined.  I reviewed the peripheral blood smear.  The hematologic indices and peripheral blood smear are consistent with a diagnosis of iron deficiency anemia.  The iron deficiency is likely related to menstrual blood loss.  She will begin oral iron replacement therapy.  She will follow-up with gynecology for management of menorrhagia.  I was present for greater than 50% of today's visit.  I performed medical decision making.

## 2020-11-06 ENCOUNTER — Encounter (HOSPITAL_BASED_OUTPATIENT_CLINIC_OR_DEPARTMENT_OTHER): Payer: Self-pay | Admitting: Nurse Practitioner

## 2020-11-06 ENCOUNTER — Other Ambulatory Visit: Payer: Self-pay | Admitting: *Deleted

## 2020-11-06 ENCOUNTER — Other Ambulatory Visit: Payer: Self-pay

## 2020-11-06 ENCOUNTER — Telehealth (INDEPENDENT_AMBULATORY_CARE_PROVIDER_SITE_OTHER): Payer: 59 | Admitting: Nurse Practitioner

## 2020-11-06 VITALS — Ht 66.0 in | Wt 168.0 lb

## 2020-11-06 DIAGNOSIS — F4323 Adjustment disorder with mixed anxiety and depressed mood: Secondary | ICD-10-CM | POA: Diagnosis not present

## 2020-11-06 DIAGNOSIS — I10 Essential (primary) hypertension: Secondary | ICD-10-CM | POA: Diagnosis not present

## 2020-11-06 MED ORDER — FERROUS SULFATE 325 (65 FE) MG PO TBEC: 325.0000 mg | DELAYED_RELEASE_TABLET | Freq: Three times a day (TID) | ORAL | 3 refills | Status: DC

## 2020-11-06 MED ORDER — ESCITALOPRAM OXALATE 10 MG PO TABS
10.0000 mg | ORAL_TABLET | Freq: Every day | ORAL | 3 refills | Status: DC
  Filled 2020-12-03: qty 30, 30d supply, fill #0
  Filled 2021-01-08: qty 30, 30d supply, fill #1

## 2020-11-06 NOTE — Assessment & Plan Note (Signed)
BP much improved with restart of benicar and amlodipine.  F/U in 6 months for repeat labs.

## 2020-11-06 NOTE — Assessment & Plan Note (Signed)
Somewhat increased anxiety symptoms with sertraline.  Will change to escitalopram 10mg  dose at bedtime to see if this improves her symptoms.  At this time continue hydroxyzine. If symptoms do not resolve, consider changing.  If tolerating escitalopram well, but still not sleeping well, consider gabapentin for sleep.  F/U in 3 months or sooner if needed.

## 2020-11-06 NOTE — Progress Notes (Signed)
Virtual Visit via Telephone Note  I connected with  Caitlyn Richardson on 11/06/20 at  440 PM EDT by telephone and verified that I am speaking with the correct person using two identifiers.   I discussed the limitations, risks, security and privacy concerns of performing an evaluation and management service by telephone and the availability of in person appointments. I also discussed with the patient that there may be a patient responsible charge related to this service. The patient expressed understanding and agreed to proceed.  Participating parties included in this telephone visit include: The patient and the nurse practitioner listed.  The patient is: At home I am: In the office  Subjective:    CC: F/U mood  HPI: Caitlyn Richardson is a 40 y.o. year old female presenting today via telephone visit to discuss mood and BP. Since her last visit she reports her BP has been well controlled. She has had her co-worker take this for her and she has had readings in the 120's/70's. She denies any headaches, CP, vision changes, or side effects.   She reports that her mood is somewhat improved with sertraline and hydroxyzine, but she has noticed that she feels more anxious and jittery. She also endorses trembling in her hands at times, which was not present before. She is sleeping OK with the hydroxyzine, but does take 2 doses on the weekends, which help more with sleep.    Past medical history, Surgical history, Family history not pertinant except as noted below, Social history, Allergies, and medications have been entered into the medical record, reviewed, and corrections made.   Review of Systems:  All review of systems negative except what is listed in the HPI  Objective:    General:  Patient speaking clearly in complete sentences. No shortness of breath noted.   Alert and oriented x3.   Normal judgment.  No apparent acute distress.  Impression and Recommendations:    Problem List Items  Addressed This Visit     Hypertension    BP much improved with restart of benicar and amlodipine.  F/U in 6 months for repeat labs.        Adjustment reaction with anxiety and depression - Primary    Somewhat increased anxiety symptoms with sertraline.  Will change to escitalopram 10mg  dose at bedtime to see if this improves her symptoms.  At this time continue hydroxyzine. If symptoms do not resolve, consider changing.  If tolerating escitalopram well, but still not sleeping well, consider gabapentin for sleep.  F/U in 3 months or sooner if needed.        Relevant Medications   escitalopram (LEXAPRO) 10 MG tablet       I discussed the assessment and treatment plan with the patient. The patient was provided an opportunity to ask questions and all were answered. The patient agreed with the plan and demonstrated an understanding of the instructions.   The patient was advised to call back or seek an in-person evaluation if the symptoms worsen or if the condition fails to improve as anticipated.  I provided 20 minutes of non-face-to-face time during this TELEPHONE encounter.    Orma Render, NP

## 2020-11-06 NOTE — Patient Instructions (Signed)
I have sent in the prescription for the escitalopram. You don't need to taper off of the sertraline, just start with the escitalopram at your next scheduled dose time.   If you are feeling good on this medication, we can keep it going, but if you have any side effects or strange feelings, please let me know and we can change it.   If you notice that the symptoms haven't changed at all once switching- we can stop the hydroxyzine and see if that is the cause.   We can plan to follow-up in 3 months or so as long as you are doing well on the medication and current dose, but please let me know if we need a visit sooner.

## 2020-11-14 ENCOUNTER — Other Ambulatory Visit (HOSPITAL_COMMUNITY): Payer: Self-pay

## 2020-11-14 MED ORDER — SERTRALINE HCL 50 MG PO TABS: ORAL_TABLET | ORAL | 2 refills | Status: DC

## 2020-11-27 ENCOUNTER — Telehealth: Payer: Self-pay | Admitting: *Deleted

## 2020-11-27 NOTE — Telephone Encounter (Signed)
Called to f/u on her 3 week labs she was going to do at [facility] She reports they told her they are not able to do these labs. Scheduled for lab here on 12/03/20, needing late afternoon due to her work.

## 2020-12-03 ENCOUNTER — Other Ambulatory Visit: Payer: Self-pay

## 2020-12-03 ENCOUNTER — Inpatient Hospital Stay: Payer: 59 | Attending: Oncology

## 2020-12-03 DIAGNOSIS — D500 Iron deficiency anemia secondary to blood loss (chronic): Secondary | ICD-10-CM | POA: Diagnosis not present

## 2020-12-03 DIAGNOSIS — N920 Excessive and frequent menstruation with regular cycle: Secondary | ICD-10-CM | POA: Diagnosis not present

## 2020-12-03 DIAGNOSIS — Z86018 Personal history of other benign neoplasm: Secondary | ICD-10-CM | POA: Insufficient documentation

## 2020-12-03 DIAGNOSIS — Z79899 Other long term (current) drug therapy: Secondary | ICD-10-CM | POA: Diagnosis not present

## 2020-12-03 DIAGNOSIS — I10 Essential (primary) hypertension: Secondary | ICD-10-CM | POA: Insufficient documentation

## 2020-12-03 DIAGNOSIS — R112 Nausea with vomiting, unspecified: Secondary | ICD-10-CM | POA: Insufficient documentation

## 2020-12-03 DIAGNOSIS — D539 Nutritional anemia, unspecified: Secondary | ICD-10-CM

## 2020-12-03 DIAGNOSIS — K5900 Constipation, unspecified: Secondary | ICD-10-CM | POA: Diagnosis not present

## 2020-12-03 LAB — CBC WITH DIFFERENTIAL (CANCER CENTER ONLY)
Abs Immature Granulocytes: 0.02 10*3/uL (ref 0.00–0.07)
Basophils Absolute: 0.1 10*3/uL (ref 0.0–0.1)
Basophils Relative: 1 %
Eosinophils Absolute: 0.1 10*3/uL (ref 0.0–0.5)
Eosinophils Relative: 1 %
HCT: 35.7 % — ABNORMAL LOW (ref 36.0–46.0)
Hemoglobin: 11.1 g/dL — ABNORMAL LOW (ref 12.0–15.0)
Immature Granulocytes: 0 %
Lymphocytes Relative: 22 %
Lymphs Abs: 1.8 10*3/uL (ref 0.7–4.0)
MCH: 24.4 pg — ABNORMAL LOW (ref 26.0–34.0)
MCHC: 31.1 g/dL (ref 30.0–36.0)
MCV: 78.5 fL — ABNORMAL LOW (ref 80.0–100.0)
Monocytes Absolute: 0.6 10*3/uL (ref 0.1–1.0)
Monocytes Relative: 7 %
Neutro Abs: 5.7 10*3/uL (ref 1.7–7.7)
Neutrophils Relative %: 69 %
Platelet Count: 308 10*3/uL (ref 150–400)
RBC: 4.55 MIL/uL (ref 3.87–5.11)
RDW: 24.9 % — ABNORMAL HIGH (ref 11.5–15.5)
WBC Count: 8.2 10*3/uL (ref 4.0–10.5)
nRBC: 0 % (ref 0.0–0.2)

## 2020-12-03 LAB — FERRITIN: Ferritin: 20 ng/mL (ref 11–307)

## 2020-12-04 ENCOUNTER — Other Ambulatory Visit (HOSPITAL_COMMUNITY): Payer: Self-pay

## 2020-12-05 ENCOUNTER — Telehealth: Payer: Self-pay

## 2020-12-05 NOTE — Telephone Encounter (Signed)
-----   Message from Owens Shark, NP sent at 12/04/2020  7:53 AM EDT ----- Please let her know hemoglobin and ferritin are better.  Continue oral iron.  Follow-up as scheduled.  I do not see where she submitted stool cards for Hemoccult testing.  Please follow-up on this.

## 2020-12-05 NOTE — Telephone Encounter (Signed)
Called made pt aware of most recent Hgb and ferritin provider order to continue oral iron and asked about hemoccult cards which pt states she will complete and return

## 2020-12-10 DIAGNOSIS — D500 Iron deficiency anemia secondary to blood loss (chronic): Secondary | ICD-10-CM | POA: Diagnosis not present

## 2020-12-10 DIAGNOSIS — R112 Nausea with vomiting, unspecified: Secondary | ICD-10-CM | POA: Diagnosis not present

## 2020-12-10 DIAGNOSIS — K5900 Constipation, unspecified: Secondary | ICD-10-CM | POA: Diagnosis not present

## 2020-12-10 DIAGNOSIS — I10 Essential (primary) hypertension: Secondary | ICD-10-CM | POA: Diagnosis not present

## 2020-12-10 DIAGNOSIS — Z86018 Personal history of other benign neoplasm: Secondary | ICD-10-CM | POA: Diagnosis not present

## 2020-12-10 DIAGNOSIS — Z79899 Other long term (current) drug therapy: Secondary | ICD-10-CM | POA: Diagnosis not present

## 2020-12-10 DIAGNOSIS — N920 Excessive and frequent menstruation with regular cycle: Secondary | ICD-10-CM | POA: Diagnosis not present

## 2020-12-11 ENCOUNTER — Other Ambulatory Visit (HOSPITAL_BASED_OUTPATIENT_CLINIC_OR_DEPARTMENT_OTHER): Payer: Self-pay

## 2020-12-11 ENCOUNTER — Other Ambulatory Visit: Payer: Self-pay

## 2020-12-11 DIAGNOSIS — D539 Nutritional anemia, unspecified: Secondary | ICD-10-CM

## 2020-12-11 LAB — OCCULT BLOOD X 1 CARD TO LAB, STOOL
Fecal Occult Bld: NEGATIVE
Fecal Occult Bld: NEGATIVE
Fecal Occult Bld: NEGATIVE

## 2020-12-11 NOTE — Progress Notes (Signed)
Per lab order review and enter occult stool

## 2020-12-18 ENCOUNTER — Inpatient Hospital Stay: Payer: 59

## 2020-12-18 ENCOUNTER — Other Ambulatory Visit: Payer: Self-pay

## 2020-12-18 ENCOUNTER — Inpatient Hospital Stay (HOSPITAL_BASED_OUTPATIENT_CLINIC_OR_DEPARTMENT_OTHER): Payer: 59 | Admitting: Nurse Practitioner

## 2020-12-18 ENCOUNTER — Encounter: Payer: Self-pay | Admitting: Nurse Practitioner

## 2020-12-18 VITALS — BP 139/81 | HR 60 | Temp 97.8°F | Resp 18 | Ht 66.0 in | Wt 174.2 lb

## 2020-12-18 DIAGNOSIS — D500 Iron deficiency anemia secondary to blood loss (chronic): Secondary | ICD-10-CM | POA: Diagnosis not present

## 2020-12-18 DIAGNOSIS — D509 Iron deficiency anemia, unspecified: Secondary | ICD-10-CM | POA: Diagnosis not present

## 2020-12-18 DIAGNOSIS — Z79899 Other long term (current) drug therapy: Secondary | ICD-10-CM | POA: Diagnosis not present

## 2020-12-18 DIAGNOSIS — Z86018 Personal history of other benign neoplasm: Secondary | ICD-10-CM | POA: Diagnosis not present

## 2020-12-18 DIAGNOSIS — I10 Essential (primary) hypertension: Secondary | ICD-10-CM | POA: Diagnosis not present

## 2020-12-18 DIAGNOSIS — R112 Nausea with vomiting, unspecified: Secondary | ICD-10-CM | POA: Diagnosis not present

## 2020-12-18 DIAGNOSIS — D539 Nutritional anemia, unspecified: Secondary | ICD-10-CM

## 2020-12-18 DIAGNOSIS — N920 Excessive and frequent menstruation with regular cycle: Secondary | ICD-10-CM | POA: Diagnosis not present

## 2020-12-18 DIAGNOSIS — K5900 Constipation, unspecified: Secondary | ICD-10-CM | POA: Diagnosis not present

## 2020-12-18 LAB — CBC WITH DIFFERENTIAL (CANCER CENTER ONLY)
Abs Immature Granulocytes: 0.04 10*3/uL (ref 0.00–0.07)
Basophils Absolute: 0.1 10*3/uL (ref 0.0–0.1)
Basophils Relative: 1 %
Eosinophils Absolute: 0.1 10*3/uL (ref 0.0–0.5)
Eosinophils Relative: 1 %
HCT: 36.2 % (ref 36.0–46.0)
Hemoglobin: 11.5 g/dL — ABNORMAL LOW (ref 12.0–15.0)
Immature Granulocytes: 1 %
Lymphocytes Relative: 23 %
Lymphs Abs: 1.7 10*3/uL (ref 0.7–4.0)
MCH: 25.3 pg — ABNORMAL LOW (ref 26.0–34.0)
MCHC: 31.8 g/dL (ref 30.0–36.0)
MCV: 79.6 fL — ABNORMAL LOW (ref 80.0–100.0)
Monocytes Absolute: 0.5 10*3/uL (ref 0.1–1.0)
Monocytes Relative: 6 %
Neutro Abs: 5.1 10*3/uL (ref 1.7–7.7)
Neutrophils Relative %: 68 %
Platelet Count: 321 10*3/uL (ref 150–400)
RBC: 4.55 MIL/uL (ref 3.87–5.11)
RDW: 22.5 % — ABNORMAL HIGH (ref 11.5–15.5)
WBC Count: 7.5 10*3/uL (ref 4.0–10.5)
nRBC: 0 % (ref 0.0–0.2)

## 2020-12-18 LAB — FERRITIN: Ferritin: 20 ng/mL (ref 11–307)

## 2020-12-18 NOTE — Progress Notes (Signed)
  [place] OFFICE PROGRESS NOTE   Diagnosis: Iron deficiency anemia  INTERVAL HISTORY:   Caitlyn Richardson returns as scheduled.  She is taking ferrous sulfate twice a day.  Overall tolerating well.  Some constipation.  She takes Dulcolax if needed.  Single episode of nausea/vomiting not clearly related to oral iron.  Energy level is better.  Menstrual cycle tends to be heavy for 4 days with clots and then light for the final 3 days.  She is scheduled to see gynecology next month.  Objective:  Vital signs in last 24 hours:  Blood pressure 139/81, pulse 60, temperature 97.8 F (36.6 C), temperature source Oral, resp. rate 18, height '5\' 6"'$  (1.676 m), weight 174 lb 3.2 oz (79 kg), SpO2 100 %.    Resp: Lungs clear bilaterally. Cardio: Regular rate and rhythm. GI: Abdomen soft and nontender.  No hepatosplenomegaly. Vascular: No leg edema.   Lab Results:  Lab Results  Component Value Date   WBC 7.5 12/18/2020   HGB 11.5 (L) 12/18/2020   HCT 36.2 12/18/2020   MCV 79.6 (L) 12/18/2020   PLT 321 12/18/2020   NEUTROABS 5.1 12/18/2020    Imaging:  No results found.  Medications: I have reviewed the patient's current medications.  Assessment/Plan: Iron deficiency anemia likely related to menstrual blood loss 10/09/2020 hemoglobin 8.3, MCV 73  10/31/2020 ferritin 3 Ferrous sulfate 325 mg 2-3 times daily 11/02/2020 Urinalysis negative for blood 11/02/2020 Stool cards negative for occult blood x3 12/10/2020 12/18/2020 hemoglobin 11.5  Ferrous sulfate 325 mg twice daily continued  Menorrhagia, history of uterine fibroids, status post myomectomy Hypertension  Disposition: Caitlyn Richardson appears stable.  We reviewed the CBC from today.  Hemoglobin with partial correction, 11.5 today.  Ferritin is pending.  She will continue oral iron.  She is scheduled to see gynecology next month for management of menorrhagia.  She will return for lab and follow-up in 3 months.  We are available to see  her sooner if needed.    Ned Card ANP/GNP-BC   12/18/2020  1:55 PM

## 2020-12-24 ENCOUNTER — Encounter (HOSPITAL_BASED_OUTPATIENT_CLINIC_OR_DEPARTMENT_OTHER): Payer: Self-pay | Admitting: Nurse Practitioner

## 2021-01-09 ENCOUNTER — Other Ambulatory Visit (HOSPITAL_COMMUNITY): Payer: Self-pay

## 2021-01-29 ENCOUNTER — Ambulatory Visit (HOSPITAL_BASED_OUTPATIENT_CLINIC_OR_DEPARTMENT_OTHER): Payer: 59 | Admitting: Obstetrics & Gynecology

## 2021-01-30 ENCOUNTER — Ambulatory Visit (HOSPITAL_BASED_OUTPATIENT_CLINIC_OR_DEPARTMENT_OTHER): Payer: 59 | Admitting: Obstetrics & Gynecology

## 2021-02-05 ENCOUNTER — Telehealth (INDEPENDENT_AMBULATORY_CARE_PROVIDER_SITE_OTHER): Payer: 59 | Admitting: Nurse Practitioner

## 2021-02-05 ENCOUNTER — Other Ambulatory Visit: Payer: Self-pay

## 2021-02-05 ENCOUNTER — Encounter (HOSPITAL_BASED_OUTPATIENT_CLINIC_OR_DEPARTMENT_OTHER): Payer: Self-pay | Admitting: Nurse Practitioner

## 2021-02-05 ENCOUNTER — Other Ambulatory Visit (HOSPITAL_COMMUNITY): Payer: Self-pay

## 2021-02-05 VITALS — BP 135/82 | Ht 65.0 in | Wt 180.0 lb

## 2021-02-05 DIAGNOSIS — F330 Major depressive disorder, recurrent, mild: Secondary | ICD-10-CM | POA: Diagnosis not present

## 2021-02-05 DIAGNOSIS — D219 Benign neoplasm of connective and other soft tissue, unspecified: Secondary | ICD-10-CM

## 2021-02-05 DIAGNOSIS — N920 Excessive and frequent menstruation with regular cycle: Secondary | ICD-10-CM | POA: Diagnosis not present

## 2021-02-05 DIAGNOSIS — F4323 Adjustment disorder with mixed anxiety and depressed mood: Secondary | ICD-10-CM | POA: Diagnosis not present

## 2021-02-05 DIAGNOSIS — D500 Iron deficiency anemia secondary to blood loss (chronic): Secondary | ICD-10-CM

## 2021-02-05 MED ORDER — HYDROXYZINE PAMOATE 25 MG PO CAPS
25.0000 mg | ORAL_CAPSULE | Freq: Every evening | ORAL | 3 refills | Status: DC | PRN
  Filled 2021-02-05: qty 180, 90d supply, fill #0
  Filled 2021-08-04: qty 180, 90d supply, fill #1
  Filled 2021-10-29: qty 180, 90d supply, fill #2

## 2021-02-05 MED ORDER — ESCITALOPRAM OXALATE 10 MG PO TABS
10.0000 mg | ORAL_TABLET | Freq: Every day | ORAL | 3 refills | Status: DC
  Filled 2021-02-05: qty 90, 90d supply, fill #0
  Filled 2021-05-11: qty 90, 90d supply, fill #1
  Filled 2021-08-04: qty 90, 90d supply, fill #2

## 2021-02-05 NOTE — Assessment & Plan Note (Signed)
Continue iron supplement as directed If unable to get in with GYN in the next 2 weeks, let me know and we will get the labs drawn for you. We can also look for a different referral if you are unable to be seen.

## 2021-02-05 NOTE — Assessment & Plan Note (Signed)
Doing very well on Lexapro and Hydroxyzine daily No dosage change needed at this time If a dosage change is needed, please let me know and we can adjust this.  F/U in 12 months or sooner if symptoms change or worsen.

## 2021-02-05 NOTE — Assessment & Plan Note (Signed)
Symptoms well controlled at this time Continue current dose of Lexapro10mg  If dose adjustment needed, please contact me or this office and we can discuss this

## 2021-02-05 NOTE — Progress Notes (Signed)
Virtual Visit Encounter mychart visit.   I connected with  Caitlyn Richardson on 02/05/21 at  3:50 PM EDT by secure audio and/or video enabled telemedicine application. I verified that I am speaking with the correct person using two identifiers.   I introduced myself as a Designer, jewellery with the practice. The limitations of evaluation and management by telemedicine discussed with the patient and the availability of in person appointments. The patient expressed verbal understanding and consent to proceed.  Participating parties in this visit include: Myself and patient  The patient is: Patient Location: Home I am: Provider Location: Office/Clinic Subjective:    CC and HPI: Caitlyn Richardson is a 40 y.o. year old female presenting for follow up of mood and medication Caitlyn Richardson reports that she is sleeping well with the hydroxyzine. She is taking it consistently  and she reports it has really helped. She also reports that she is doing well with her Lexapro and has been taking this daily. She is tolerating this much more than sertraline.  She has no concerns with the medication. She does express that there are still some "not great" days, but these are much less than good days.  She is also taking her iron twice a day and has an appointment with OB scheduled in the next 2 weeks.   Past medical history, Surgical history, Family history not pertinant except as noted below, Social history, Allergies, and medications have been entered into the medical record, reviewed, and corrections made.   Review of Systems:  All review of systems negative except what is listed in the HPI  Objective:    Alert and oriented x 4 Speaking in clear sentences with no shortness of breath. No distress.  Impression and Recommendations:    Problem List Items Addressed This Visit     Fibroid    Discussed medication option Myfembree with patient Recommend bringing this up with GYN for further discussion and recommendations  for treatment of uterine fibroids.       Iron deficiency anemia due to chronic blood loss    Continue iron supplement as directed If unable to get in with GYN in the next 2 weeks, let me know and we will get the labs drawn for you. We can also look for a different referral if you are unable to be seen.       Mild recurrent major depression (HCC)    Symptoms well controlled at this time Continue current dose of Lexapro10mg  If dose adjustment needed, please contact me or this office and we can discuss this      Relevant Medications   hydrOXYzine (VISTARIL) 25 MG capsule   escitalopram (LEXAPRO) 10 MG tablet   Adjustment reaction with anxiety and depression    Doing very well on Lexapro and Hydroxyzine daily No dosage change needed at this time If a dosage change is needed, please let me know and we can adjust this.  F/U in 12 months or sooner if symptoms change or worsen.       Relevant Medications   hydrOXYzine (VISTARIL) 25 MG capsule   escitalopram (LEXAPRO) 10 MG tablet   Excessive and frequent menstruation with irregular cycle - Primary    Scheduled to see GYN in the next 2 weeks If unable to be seen please let me know and we can send another referral for a different provider. Continue your iron We can check your iron levels, if needed. Just let me know.  I discussed the assessment and treatment plan with the patient. The patient was provided an opportunity to ask questions and all were answered. The patient agreed with the plan and demonstrated an understanding of the instructions.   The patient was advised to call back or seek an in-person evaluation if the symptoms worsen or if the condition fails to improve as anticipated.  Follow-Up: in a year  I provided 20 minutes of non-face-to-face interaction with this non face-to-face encounter including intake, same-day documentation, and chart review.   Orma Render, NP , DNP, AGNP-c [place] [facility] [phone] (fax)

## 2021-02-05 NOTE — Assessment & Plan Note (Signed)
Discussed medication option Myfembree with patient Recommend bringing this up with GYN for further discussion and recommendations for treatment of uterine fibroids.

## 2021-02-05 NOTE — Assessment & Plan Note (Signed)
Scheduled to see GYN in the next 2 weeks If unable to be seen please let me know and we can send another referral for a different provider. Continue your iron We can check your iron levels, if needed. Just let me know.

## 2021-02-05 NOTE — Patient Instructions (Signed)
Continue iron supplements as directed If you need your iron levels checked and cannot get in with GYN, let me know and I will place those orders for you  I have sent a years worth of refills on the lexapro and hydroxyzine. If you feel like you need an adjustment to the medication, please let me know.

## 2021-02-14 ENCOUNTER — Other Ambulatory Visit (HOSPITAL_COMMUNITY)
Admission: RE | Admit: 2021-02-14 | Discharge: 2021-02-14 | Disposition: A | Discharge status: Home / Self Care | Payer: 59 | Source: Ambulatory Visit | Attending: Obstetrics & Gynecology | Admitting: Obstetrics & Gynecology

## 2021-02-14 ENCOUNTER — Other Ambulatory Visit: Payer: Self-pay

## 2021-02-14 ENCOUNTER — Encounter (HOSPITAL_BASED_OUTPATIENT_CLINIC_OR_DEPARTMENT_OTHER): Payer: Self-pay | Admitting: Obstetrics & Gynecology

## 2021-02-14 ENCOUNTER — Ambulatory Visit (INDEPENDENT_AMBULATORY_CARE_PROVIDER_SITE_OTHER): Payer: 59 | Admitting: Obstetrics & Gynecology

## 2021-02-14 VITALS — BP 126/74 | HR 64 | Ht 66.0 in | Wt 179.2 lb

## 2021-02-14 DIAGNOSIS — D251 Intramural leiomyoma of uterus: Secondary | ICD-10-CM | POA: Diagnosis not present

## 2021-02-14 DIAGNOSIS — Z124 Encounter for screening for malignant neoplasm of cervix: Secondary | ICD-10-CM | POA: Diagnosis not present

## 2021-02-14 DIAGNOSIS — N920 Excessive and frequent menstruation with regular cycle: Secondary | ICD-10-CM

## 2021-02-14 NOTE — Progress Notes (Addendum)
GYNECOLOGY  VISIT  CC:   menorrhagia  HPI: 40 y.o. [id number] Single Black or African American female here for discussion of treatment for menorrhagia.  She is referred from her PCP, Jacolyn Reedy, NP.  Cycles are regular.  Flow lasts for 6-7 days and 2-3 days are really heavy needing to wear overnight maxi pads with period panties during the day and using a towel in bed to bleed at night.  She had a myomectomy done last year in [place], [place].  Operative note reviewed in Epic.  This was done hysteroscopically.  Pathology only showed endometrium and normal myometrial tissue and no fibroid tissue.  Also, ultrasound looks like this was likely more intramural location of fibroid so am doubtful fibroid was fully removed.  H/o PID earlier this year.  Was diagnosed with PID in an ER visit.  She was treated with antibiotics and it all resolved as an outpatient.    GYNECOLOGIC HISTORY: Patient's last menstrual period was 01/31/2021 (exact date).   Patient Active Problem List   Diagnosis Date Noted   Establishing care with new doctor, encounter for 10/09/2020   Adjustment reaction with anxiety and depression 10/09/2020   Routine screening for STI (sexually transmitted infection) 10/09/2020   Encounter for annual physical exam 10/09/2020   Fibroid 04/12/2019   Iron deficiency anemia due to chronic blood loss 04/12/2019   Excessive and frequent menstruation with irregular cycle 02/28/2019   Degenerative disc disease, lumbar 11/24/2018   Mixed hyperlipidemia 10/23/2016   Lumbosacral spondylosis without myelopathy 07/31/2016   Mild recurrent major depression ([place]) 06/24/2016   Vitamin D deficiency 04/24/2016   Sciatica 09/05/2015   Essential hypertension 02/26/2015   Lumbosacral ligament sprain 07/23/2006   Dysplasia of cervix (uteri) 10/20/2000    Past Medical History:  Diagnosis Date   Abnormal glandular Papanicolaou smear of cervix 07/23/2006   Anxiety disorder 10/09/2020   Anxiety disorder 05/07/2017    Body mass index 28.0-28.9, adult 10/09/2020   Chronic left-sided low back pain with left-sided sciatica 10/17/2019   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 ICD10 Conversion   Excessive and frequent menstruation with irregular cycle 10/09/2020   Female genital symptoms 07/23/2006   Formatting of this note might be different from the original. ICD10 Conversion   Iron deficiency anemia due to chronic blood loss 10/09/2020   Lumbosacral spondylosis without myelopathy 10/09/2020   Mixed hyperlipidemia 10/09/2020   Other chest pain 10/09/2020   Sciatica 10/09/2020    Past Surgical History:  Procedure Laterality Date   CESAREAN SECTION     MYOMECTOMY  2021    MEDS:   Current Outpatient Medications on File Prior to Visit  Medication Sig Dispense Refill   amLODipine (NORVASC) 5 MG tablet Take 1 tablet  by mouth daily. 90 tablet 3   escitalopram (LEXAPRO) 10 MG tablet Take 1 tablet  by mouth at bedtime. 90 tablet 3   ferrous sulfate 325 (65 FE) MG EC tablet Take 1 tablet (325 mg total) by mouth 3 (three) times daily with meals. Start with BID and increase to TID if tolerated (Patient taking differently: Take 325 mg by mouth 2 (two) times daily with a meal. Start with BID and increase to TID if tolerated)  3   hydrOXYzine (VISTARIL) 25 MG capsule Take 1 capsule by mouth at bedtime and may repeat dose one time if needed. 180 capsule 3   loteprednol (LOTEMAX) 0.5 % ophthalmic suspension 1 drop 2 (  two) times daily.     olmesartan-hydrochlorothiazide (BENICAR HCT) 40-25 MG tablet Take 1 tablet by mouth daily. 90 tablet 3   acetaminophen (TYLENOL) 500 MG tablet Take by mouth. (Patient not taking: Reported on 02/14/2021)     No current facility-administered medications on file prior to visit.    ALLERGIES: Patient has no known allergies.  Family History  Problem Relation Age of Onset   Hypertension Maternal Grandfather    Heart attack Father     Hypertension Mother    Thyroid disease Mother    Hypertension Brother     SH:  single, non smoker  Review of Systems  Constitutional: Negative.   Respiratory: Negative.    Cardiovascular: Negative.   Genitourinary:  Positive for menstrual problem.   PHYSICAL EXAMINATION:    BP 126/74   Pulse 64   Ht 5\' 6"  (1.676 m)   Wt 179 lb 3.2 oz (81.3 kg)   LMP 01/31/2021 (Exact Date)   BMI 28.92 kg/m     General appearance: alert, cooperative and appears stated age Abdomen: soft, non-tender; bowel sounds normal; no masses,  no organomegaly Lymph:  no inguinal LAD noted  Pelvic: External genitalia:  no lesions              Urethra:  normal appearing urethra with no masses, tenderness or lesions              Bartholins and Skenes: normal                 Vagina: normal appearing vagina with normal color and discharge, no lesions              Cervix: no lesions              Bimanual Exam:  Uterus:  enlarged, 10 weeks size, retroverted to difficult to assess for presence of fibroid but uterus feel enlarged              Adnexa: no mass, fullness, tenderness    Chaperone, Ezekiel Ina, RN was present for exam.  Assessment/Plan: 1. Menorrhagia with regular cycle - CBC with Differential/Platelet - Ferritin - on iron, will continue for now  2. Cervical cancer screening - Cytology - PAP( [facility])  3.  Intramural fibroid - pt is going to return for PUS.   - treatment options including progestins, Lysteda, GnRH agonists, RFA fibroid treatment, hysterectomy all discussed.  Information given.

## 2021-02-15 ENCOUNTER — Encounter (HOSPITAL_BASED_OUTPATIENT_CLINIC_OR_DEPARTMENT_OTHER): Payer: Self-pay

## 2021-02-15 LAB — CBC WITH DIFFERENTIAL/PLATELET
Basophils Absolute: 0 10*3/uL (ref 0.0–0.2)
Basos: 1 %
EOS (ABSOLUTE): 0.1 10*3/uL (ref 0.0–0.4)
Eos: 1 %
Hematocrit: 37.3 % (ref 34.0–46.6)
Hemoglobin: 12.3 g/dL (ref 11.1–15.9)
Immature Grans (Abs): 0 10*3/uL (ref 0.0–0.1)
Immature Granulocytes: 0 %
Lymphocytes Absolute: 1.7 10*3/uL (ref 0.7–3.1)
Lymphs: 19 %
MCH: 28.4 pg (ref 26.6–33.0)
MCHC: 33 g/dL (ref 31.5–35.7)
MCV: 86 fL (ref 79–97)
Monocytes Absolute: 0.6 10*3/uL (ref 0.1–0.9)
Monocytes: 7 %
Neutrophils Absolute: 6.3 10*3/uL (ref 1.4–7.0)
Neutrophils: 72 %
Platelets: 338 10*3/uL (ref 150–450)
RBC: 4.33 x10E6/uL (ref 3.77–5.28)
RDW: 14.3 % (ref 11.7–15.4)
WBC: 8.8 10*3/uL (ref 3.4–10.8)

## 2021-02-15 LAB — FERRITIN: Ferritin: 47 ng/mL (ref 15–150)

## 2021-02-17 NOTE — Addendum Note (Signed)
Addended by: Megan Salon on: 02/17/2021 10:38 PM   Modules accepted: Level of Service

## 2021-02-18 LAB — CYTOLOGY - PAP
Comment: NEGATIVE
Diagnosis: NEGATIVE
High risk HPV: NEGATIVE

## 2021-02-20 ENCOUNTER — Ambulatory Visit (INDEPENDENT_AMBULATORY_CARE_PROVIDER_SITE_OTHER): Payer: 59

## 2021-02-20 ENCOUNTER — Ambulatory Visit (INDEPENDENT_AMBULATORY_CARE_PROVIDER_SITE_OTHER): Payer: 59 | Admitting: Obstetrics & Gynecology

## 2021-02-20 ENCOUNTER — Other Ambulatory Visit: Payer: Self-pay

## 2021-02-20 DIAGNOSIS — N920 Excessive and frequent menstruation with regular cycle: Secondary | ICD-10-CM | POA: Diagnosis not present

## 2021-02-20 DIAGNOSIS — D500 Iron deficiency anemia secondary to blood loss (chronic): Secondary | ICD-10-CM

## 2021-02-20 DIAGNOSIS — D251 Intramural leiomyoma of uterus: Secondary | ICD-10-CM

## 2021-02-23 ENCOUNTER — Encounter (HOSPITAL_BASED_OUTPATIENT_CLINIC_OR_DEPARTMENT_OTHER): Payer: Self-pay | Admitting: Obstetrics & Gynecology

## 2021-02-23 ENCOUNTER — Encounter (HOSPITAL_BASED_OUTPATIENT_CLINIC_OR_DEPARTMENT_OTHER): Payer: Self-pay

## 2021-02-23 NOTE — Progress Notes (Signed)
GYNECOLOGY  VISIT  CC:   review ultrasound and discuss results  HPI: 40 y.o. [id number] Single Black or African American female here for discussion of ultrasound results and to proceed with treatment options.  Ultrasound showed uterus enlarged and measuring 10.4 x 6.1 x 6.4.  three fibroids noted.  The one that was previously present appears to still be present.  Uterine volume is 232ml, about twice normal size.  Ovaries normal.  Pt's hemoglobin and iron levels reviewed.  She is going to continue her iron until she has made decision about treatment.  She and I discussed options.  She really does not want a hysterectomy at this time.  Oral progesterone, Lysteda, Mirena IUD, Sonata, Uterine artery embolization all discussed.    She has reviewed information about [place] agonists and antagonists and does not want to use either one of these due to possible hair loss.  She has some issues with this already and does not want any more problems.  Pathology from 08/2019 hysteroscopy reviewed at last visit.  This was normal so do not feel endometrial biopsy needed at this time.  GYNECOLOGIC HISTORY: Patient's last menstrual period was 01/31/2021 (exact date).  Patient Active Problem List   Diagnosis Date Noted   Establishing care with new doctor, encounter for 10/09/2020   Adjustment reaction with anxiety and depression 10/09/2020   Routine screening for STI (sexually transmitted infection) 10/09/2020   Encounter for annual physical exam 10/09/2020   Fibroid 04/12/2019   Iron deficiency anemia due to chronic blood loss 04/12/2019   Excessive and frequent menstruation with irregular cycle 02/28/2019   Degenerative disc disease, lumbar 11/24/2018   Mixed hyperlipidemia 10/23/2016   Lumbosacral spondylosis without myelopathy 07/31/2016   Mild recurrent major depression ([place]) 06/24/2016   Vitamin D deficiency 04/24/2016   Sciatica 09/05/2015   Essential hypertension 02/26/2015   Lumbosacral ligament  sprain 07/23/2006   Dysplasia of cervix (uteri) 10/20/2000    Past Medical History:  Diagnosis Date   Abnormal glandular Papanicolaou smear of cervix 07/23/2006   Anxiety disorder 10/09/2020   Anxiety disorder 05/07/2017   Body mass index 28.0-28.9, adult 10/09/2020   Chronic left-sided low back pain with left-sided sciatica 10/17/2019   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 ICD10 Conversion   Excessive and frequent menstruation with irregular cycle 10/09/2020   Female genital symptoms 07/23/2006   Formatting of this note might be different from the original. ICD10 Conversion   Iron deficiency anemia due to chronic blood loss 10/09/2020   Lumbosacral spondylosis without myelopathy 10/09/2020   Mixed hyperlipidemia 10/09/2020   Other chest pain 10/09/2020   Sciatica 10/09/2020    Past Surgical History:  Procedure Laterality Date   CESAREAN SECTION     MYOMECTOMY  2021    MEDS:   Current Outpatient Medications on File Prior to Visit  Medication Sig Dispense Refill   acetaminophen (TYLENOL) 500 MG tablet Take by mouth. (Patient not taking: Reported on 02/14/2021)     amLODipine (NORVASC) 5 MG tablet Take 1 tablet  by mouth daily. 90 tablet 3   escitalopram (LEXAPRO) 10 MG tablet Take 1 tablet  by mouth at bedtime. 90 tablet 3   ferrous sulfate 325 (65 FE) MG EC tablet Take 1 tablet (325 mg total) by mouth 3 (three) times daily with meals. Start with BID and increase to TID if tolerated (Patient taking differently: Take 325 mg by mouth 2 (two) times daily with  a meal. Start with BID and increase to TID if tolerated)  3   hydrOXYzine (VISTARIL) 25 MG capsule Take 1 capsule by mouth at bedtime and may repeat dose one time if needed. 180 capsule 3   loteprednol (LOTEMAX) 0.5 % ophthalmic suspension 1 drop 2 (two) times daily.     olmesartan-hydrochlorothiazide (BENICAR HCT) 40-25 MG tablet Take 1 tablet by mouth daily. 90 tablet 3   No  current facility-administered medications on file prior to visit.    ALLERGIES: Patient has no known allergies.  Family History  Problem Relation Age of Onset   Hypertension Maternal Grandfather    Heart attack Father    Hypertension Mother    Thyroid disease Mother    Hypertension Brother     SH:  single, non smoker  Review of Systems  Genitourinary:        Menorrhagia   PHYSICAL EXAMINATION:    LMP 01/31/2021 (Exact Date)     General appearance: alert, cooperative and appears stated age No other exam performed today  Assessment/Plan: 1. Menorrhagia with regular cycle - pt is going to consider options and let me know what she wants to to next for treatment  2. Intramural uterine fibroid  3. Iron deficiency anemia due to chronic blood loss - hb and iron levels improved with follow up testing done last week  Total time with pt:  22 minutes

## 2021-03-01 ENCOUNTER — Other Ambulatory Visit (HOSPITAL_COMMUNITY): Payer: Self-pay

## 2021-03-05 ENCOUNTER — Encounter (HOSPITAL_BASED_OUTPATIENT_CLINIC_OR_DEPARTMENT_OTHER): Payer: Self-pay

## 2021-03-07 ENCOUNTER — Encounter (HOSPITAL_BASED_OUTPATIENT_CLINIC_OR_DEPARTMENT_OTHER): Payer: Self-pay

## 2021-03-12 ENCOUNTER — Other Ambulatory Visit: Payer: Self-pay

## 2021-03-12 ENCOUNTER — Inpatient Hospital Stay: Payer: 59

## 2021-03-12 ENCOUNTER — Inpatient Hospital Stay: Payer: 59 | Attending: Oncology | Admitting: Oncology

## 2021-03-12 VITALS — BP 134/78 | HR 62 | Temp 97.8°F | Resp 18 | Ht 66.0 in | Wt 181.0 lb

## 2021-03-12 DIAGNOSIS — Z86018 Personal history of other benign neoplasm: Secondary | ICD-10-CM | POA: Diagnosis not present

## 2021-03-12 DIAGNOSIS — D500 Iron deficiency anemia secondary to blood loss (chronic): Secondary | ICD-10-CM | POA: Diagnosis not present

## 2021-03-12 DIAGNOSIS — D509 Iron deficiency anemia, unspecified: Secondary | ICD-10-CM | POA: Diagnosis not present

## 2021-03-12 DIAGNOSIS — N920 Excessive and frequent menstruation with regular cycle: Secondary | ICD-10-CM | POA: Diagnosis not present

## 2021-03-12 DIAGNOSIS — I10 Essential (primary) hypertension: Secondary | ICD-10-CM | POA: Insufficient documentation

## 2021-03-12 LAB — CBC WITH DIFFERENTIAL (CANCER CENTER ONLY)
Abs Immature Granulocytes: 0.02 10*3/uL (ref 0.00–0.07)
Basophils Absolute: 0.1 10*3/uL (ref 0.0–0.1)
Basophils Relative: 1 %
Eosinophils Absolute: 0.1 10*3/uL (ref 0.0–0.5)
Eosinophils Relative: 2 %
HCT: 35.4 % — ABNORMAL LOW (ref 36.0–46.0)
Hemoglobin: 11.7 g/dL — ABNORMAL LOW (ref 12.0–15.0)
Immature Granulocytes: 0 %
Lymphocytes Relative: 23 %
Lymphs Abs: 2 10*3/uL (ref 0.7–4.0)
MCH: 28.8 pg (ref 26.0–34.0)
MCHC: 33.1 g/dL (ref 30.0–36.0)
MCV: 87.2 fL (ref 80.0–100.0)
Monocytes Absolute: 0.5 10*3/uL (ref 0.1–1.0)
Monocytes Relative: 6 %
Neutro Abs: 5.7 10*3/uL (ref 1.7–7.7)
Neutrophils Relative %: 68 %
Platelet Count: 330 10*3/uL (ref 150–400)
RBC: 4.06 MIL/uL (ref 3.87–5.11)
RDW: 13 % (ref 11.5–15.5)
WBC Count: 8.4 10*3/uL (ref 4.0–10.5)
nRBC: 0 % (ref 0.0–0.2)

## 2021-03-12 LAB — FERRITIN: Ferritin: 21 ng/mL (ref 11–307)

## 2021-03-12 NOTE — Progress Notes (Signed)
  [name]   Diagnosis: Iron deficiency anemia  INTERVAL HISTORY:   Caitlyn Richardson returns as scheduled.  She is taking iron once daily.  She continues to have menorrhagia.  She is deciding on proceeding with a hysterectomy or ablation procedure.  She was noted to have multiple fibroids on a recent ultrasound.  No other bleeding.  Objective:  Vital signs in last 24 hours:  Blood pressure 134/78, pulse 62, temperature 97.8 F (36.6 C), temperature source Oral, resp. rate 18, height 5\' 6"  (1.676 m), weight 181 lb (82.1 kg), SpO2 100 %.  Resp: Lungs clear bilaterally Cardio: Regular rate and rhythm GI: No hepatosplenomegaly, no mass, nontender Vascular: Leg edema   Lab Results:  Lab Results  Component Value Date   WBC 8.4 03/12/2021   HGB 11.7 (L) 03/12/2021   HCT 35.4 (L) 03/12/2021   MCV 87.2 03/12/2021   PLT 330 03/12/2021   NEUTROABS 5.7 03/12/2021    CMP  Lab Results  Component Value Date   NA 144 10/09/2020   K 3.7 10/09/2020   CL 104 10/09/2020   CO2 21 10/09/2020   GLUCOSE 117 (H) 10/09/2020   BUN 11 10/09/2020   CREATININE 0.75 10/09/2020   CALCIUM 9.7 10/09/2020   PROT 7.5 10/09/2020   ALBUMIN 4.5 10/09/2020   AST 10 10/09/2020   ALT 11 10/09/2020   ALKPHOS 62 10/09/2020   BILITOT 0.5 10/09/2020     Medications: I have reviewed the patient's current medications.   Assessment/Plan: Iron deficiency anemia likely related to menstrual blood loss 10/09/2020 hemoglobin 8.3, MCV 73  10/31/2020 ferritin 3 Ferrous sulfate 325 mg 2-3 times daily 11/02/2020 Urinalysis negative for blood 11/02/2020 Stool cards negative for occult blood x3 12/10/2020 12/18/2020 hemoglobin 11.5  Ferrous sulfate 325 mg twice daily continued, changed to once daily dosing October 2022  Menorrhagia, history of uterine fibroids, status post myomectomy Pelvic ultrasound 02/20/2021-multiple uterine fibroids Hypertension    Disposition: Caitlyn Richardson has  a history of iron deficiency anemia secondary to menstrual blood loss.  The anemia has improved with iron replacement.  The MCV is now normal and the hemoglobin is at the low end of the normal range.  She will continue once daily iron.  She will call for symptoms of anemia.  She plans to proceed with a hysterectomy or uterine ablation procedure by Dr. Sabra Heck.  She will return for an office visit and CBC in 4 months.  Betsy Coder, MD  03/12/2021  3:31 PM

## 2021-03-14 ENCOUNTER — Encounter (HOSPITAL_BASED_OUTPATIENT_CLINIC_OR_DEPARTMENT_OTHER): Payer: Self-pay

## 2021-03-19 ENCOUNTER — Encounter (HOSPITAL_BASED_OUTPATIENT_CLINIC_OR_DEPARTMENT_OTHER): Payer: Self-pay

## 2021-03-25 ENCOUNTER — Other Ambulatory Visit (HOSPITAL_BASED_OUTPATIENT_CLINIC_OR_DEPARTMENT_OTHER): Payer: Self-pay | Admitting: Obstetrics & Gynecology

## 2021-03-25 MED ORDER — CYCLOBENZAPRINE HCL 10 MG PO TABS: 10.0000 mg | ORAL_TABLET | Freq: Three times a day (TID) | ORAL | 0 refills | Status: DC | PRN

## 2021-03-26 ENCOUNTER — Ambulatory Visit (INDEPENDENT_AMBULATORY_CARE_PROVIDER_SITE_OTHER): Payer: 59 | Admitting: Nurse Practitioner

## 2021-03-26 ENCOUNTER — Other Ambulatory Visit: Payer: Self-pay

## 2021-03-26 ENCOUNTER — Encounter (HOSPITAL_BASED_OUTPATIENT_CLINIC_OR_DEPARTMENT_OTHER): Payer: Self-pay | Admitting: Nurse Practitioner

## 2021-03-26 DIAGNOSIS — I10 Essential (primary) hypertension: Secondary | ICD-10-CM | POA: Diagnosis not present

## 2021-03-26 DIAGNOSIS — G44219 Episodic tension-type headache, not intractable: Secondary | ICD-10-CM | POA: Insufficient documentation

## 2021-03-26 HISTORY — DX: Episodic tension-type headache, not intractable: G44.219

## 2021-03-26 MED ORDER — GABAPENTIN 100 MG PO CAPS: ORAL_CAPSULE | ORAL | 3 refills | Status: DC

## 2021-03-26 NOTE — Assessment & Plan Note (Signed)
BP at time of visit 131/70.  She has not checked this during times of headache, but will do so.  Recommend monitoring blood pressure to see if this could be trigger.  No alarm signs present.  Will monitor.

## 2021-03-26 NOTE — Progress Notes (Signed)
Virtual Visit Encounter telephone visit.   I connected with  Caitlyn Richardson on 03/26/21 at 10:10 AM EST by secure audio and/or video enabled telemedicine application. I verified that I am speaking with the correct person using two identifiers.   I introduced myself as a Designer, jewellery with the practice. The limitations of evaluation and management by telemedicine discussed with the patient and the availability of in person appointments. The patient expressed verbal understanding and consent to proceed.  Participating parties in this visit include: Myself and patient  The patient is: Patient Location: Home I am: Provider Location: Office/Clinic Subjective:    CC and HPI: Caitlyn Richardson is a 40 y.o. year old female presenting for new evaluation and treatment of headache pain that occurs in the right side of the head near the crown in about a 1 inch location in a horizontal line. She reports the headaches have been occurring once a week and only while upset. She states that the headaches last for about 10 minutes and then resolve on their own. She denies weakness, nausea, vision changes, slurred speech, dizziness, or confusion. She states they are a 5/10 on the pain scale. She has not taken anything for these.  She does endorse increased stress.   Past medical history, Surgical history, Family history not pertinant except as noted below, Social history, Allergies, and medications have been entered into the medical record, reviewed, and corrections made.   Review of Systems:  All review of systems negative except what is listed in the HPI  Objective:    Alert and oriented x 4 Speaking in clear sentences with no shortness of breath. No distress.  Impression and Recommendations:    Problem List Items Addressed This Visit     Essential hypertension    BP at time of visit 131/70.  She has not checked this during times of headache, but will do so.  Recommend monitoring blood pressure to  see if this could be trigger.  No alarm signs present.  Will monitor.       Episodic tension-type headache, not intractable - Primary    Symptoms of nerve-type headache pain exacerbated during periods of emotional distress only.  Resolve spontaneously without intervention.  Not severe in nature Consistent with emotional/tension type headache exacerbation.  No warning signs present.  Recommend avoiding increased stress and tension. Monitor BP if they occur again and report any significant elevations (>160/90) or seek emergency care.  Discussed warning signs that would warrant immediate evaluation in the emergency room.  Will trial gabapentin for relief if headaches do not subside on their own given the nerve-type nature.  She will follow-up if these worsen or fail to improve.       Relevant Medications   gabapentin (NEURONTIN) 100 MG capsule    orders and follow up as documented in EMR I discussed the assessment and treatment plan with the patient. The patient was provided an opportunity to ask questions and all were answered. The patient agreed with the plan and demonstrated an understanding of the instructions.   The patient was advised to call back or seek an in-person evaluation if the symptoms worsen or if the condition fails to improve as anticipated.  Follow-Up: prn  I provided 20 minutes of non-face-to-face interaction with this non face-to-face encounter including intake, same-day documentation, and chart review.   Orma Render, NP , DNP, AGNP-c [place] [facility] [phone] (fax)

## 2021-03-26 NOTE — Assessment & Plan Note (Signed)
Symptoms of nerve-type headache pain exacerbated during periods of emotional distress only.  Resolve spontaneously without intervention.  Not severe in nature Consistent with emotional/tension type headache exacerbation.  No warning signs present.  Recommend avoiding increased stress and tension. Monitor BP if they occur again and report any significant elevations (>160/90) or seek emergency care.  Discussed warning signs that would warrant immediate evaluation in the emergency room.  Will trial gabapentin for relief if headaches do not subside on their own given the nerve-type nature.  She will follow-up if these worsen or fail to improve.

## 2021-03-27 ENCOUNTER — Other Ambulatory Visit (HOSPITAL_BASED_OUTPATIENT_CLINIC_OR_DEPARTMENT_OTHER): Payer: 59

## 2021-04-09 ENCOUNTER — Encounter (HOSPITAL_BASED_OUTPATIENT_CLINIC_OR_DEPARTMENT_OTHER): Payer: Self-pay | Admitting: Family Medicine

## 2021-04-09 ENCOUNTER — Other Ambulatory Visit (HOSPITAL_COMMUNITY): Payer: Self-pay

## 2021-04-09 ENCOUNTER — Other Ambulatory Visit: Payer: Self-pay

## 2021-04-09 ENCOUNTER — Ambulatory Visit (HOSPITAL_BASED_OUTPATIENT_CLINIC_OR_DEPARTMENT_OTHER): Payer: 59 | Admitting: Family Medicine

## 2021-04-09 ENCOUNTER — Encounter (HOSPITAL_BASED_OUTPATIENT_CLINIC_OR_DEPARTMENT_OTHER): Payer: Self-pay | Admitting: Nurse Practitioner

## 2021-04-09 VITALS — BP 142/70 | HR 76 | Ht 66.14 in | Wt 182.2 lb

## 2021-04-09 DIAGNOSIS — M5432 Sciatica, left side: Secondary | ICD-10-CM

## 2021-04-09 MED ORDER — PREDNISONE 20 MG PO TABS
20.0000 mg | ORAL_TABLET | Freq: Every day | ORAL | 0 refills | Status: DC
Start: 2021-04-09 — End: 2021-04-09
  Filled 2021-04-09: qty 5, 5d supply, fill #0

## 2021-04-09 MED ORDER — PREDNISONE 20 MG PO TABS: 20.0000 mg | ORAL_TABLET | Freq: Every day | ORAL | 0 refills | Status: DC

## 2021-04-09 NOTE — Assessment & Plan Note (Addendum)
Patient presents for evaluation of left leg pain Pain has been present for about 2 to 3 weeks Pain extends from left buttock down to left calf Pain is worse with prolonged sitting, was also worse with icing Pain is improved with walking, standing She was previously prescribed Flexeril due to her menstrual cycles and gabapentin due to her headaches, she has been taking these for about 1 month and denies any improvement in symptoms with these medications She also have pain with forward flexion, no significant pain with back extension Denies any associated lower extremity swelling Has had prior similar episodes over the past 4 years, indicates about 1-2 episodes per year With prior episodes she has tried Toradol without relief as well as prednisone with moderate relief On exam, patient with mild tenderness through lateral aspect of left calf, no significant tenderness centrally over calf.  Patient with tightness of her posterior thigh with straight leg raise, however no radicular symptoms with this.  Distal neurovascular exam intact bilaterally.  History and exam are most consistent with sciatica, less likely related to any underlying DVT in regards to her calf pain. Given unlikely nature of DVT, offered ultrasound for evaluation versus treatment of sciatica and monitoring of Symptoms, patient elected to proceed with treatment of sciatica Can continue with OTC measures Will send prescription for short course of steroids to try to help control symptoms Recommend referral to physical therapy, home exercise program as per PT Plan for follow-up with PCP in about 4 to 6 weeks to monitor progress If symptoms not improving as expected, likely neck steps would be proceeding with x-rays initially and then possible MRI of lumbar spine for further evaluation

## 2021-04-09 NOTE — Patient Instructions (Addendum)
  Medication Instructions:  Your physician recommends that you continue on your current medications as directed. Please refer to the Current Medication list given to you today. --If you need a refill on any your medications before your next appointment, please call your pharmacy first. If no refills are authorized on file call the office.--   Referrals/Procedures/Imaging: A referral has been placed for you to [facility] for evaluation and treatment. Someone from the scheduling department will be in contact with you in regards to coordinating your consultation. If you do not hear from any of the schedulers within 7-10 business days please give their office a call.   Follow-Up: Your next appointment:   Your physician recommends that you schedule a follow-up appointment in: 4-6 weeks with Darden Dates, DNP      Thanks for letting us be apart of your health journey!!        [facility]   Dr. Raymond de [place] II., MD, MPH               We recommend signing up for the patient portal called "MyChart".  Sign up information is provided on this After Visit Summary.  MyChart is used to connect with patients for Virtual Visits (Telemedicine).  Patients are able to view lab/test results, encounter notes, upcoming appointments, etc.  Non-urgent messages can be sent to your provider as well.   To learn more about what you can do with MyChart, please visit --  [email or web address].

## 2021-04-09 NOTE — Progress Notes (Signed)
    Procedures performed today:    None.  Independent interpretation of notes and tests performed by another provider:   None.  Brief History, Exam, Impression, and Recommendations:    BP (!) 142/70   Pulse 76   Ht 5' 6.14" (1.68 m)   Wt 182 lb 3.2 oz (82.6 kg)   LMP  (LMP Unknown)   SpO2 100%   BMI 29.28 kg/m   Sciatica Patient presents for evaluation of left leg pain Pain has been present for about 2 to 3 weeks Pain extends from left buttock down to left calf Pain is worse with prolonged sitting, was also worse with icing Pain is improved with walking, standing She was previously prescribed Flexeril due to her menstrual cycles and gabapentin due to her headaches, she has been taking these for about 1 month and denies any improvement in symptoms with these medications She also have pain with forward flexion, no significant pain with back extension Denies any associated lower extremity swelling Has had prior similar episodes over the past 4 years, indicates about 1-2 episodes per year With prior episodes she has tried Toradol without relief as well as prednisone with moderate relief On exam, patient with mild tenderness through lateral aspect of left calf, no significant tenderness centrally over calf.  Patient with tightness of her posterior thigh with straight leg raise, however no radicular symptoms with this.  Distal neurovascular exam intact bilaterally.  History and exam are most consistent with sciatica, less likely related to any underlying DVT in regards to her calf pain. Given unlikely nature of DVT, offered ultrasound for evaluation versus treatment of sciatica and monitoring of Symptoms, patient elected to proceed with treatment of sciatica Can continue with OTC measures Will send prescription for short course of steroids to try to help control symptoms Recommend referral to physical therapy, home exercise program as per PT Plan for follow-up with PCP in about 4 to  6 weeks to monitor progress If symptoms not improving as expected, likely neck steps would be proceeding with x-rays initially and then possible MRI of lumbar spine for further evaluation   ___________________________________________ Oneka Parada de [place], MD, ABFM, CAQSM [facility]

## 2021-04-12 ENCOUNTER — Other Ambulatory Visit (HOSPITAL_COMMUNITY): Payer: Self-pay

## 2021-05-02 ENCOUNTER — Ambulatory Visit (HOSPITAL_BASED_OUTPATIENT_CLINIC_OR_DEPARTMENT_OTHER): Payer: 59 | Admitting: Nurse Practitioner

## 2021-05-04 DIAGNOSIS — M5432 Sciatica, left side: Secondary | ICD-10-CM | POA: Diagnosis not present

## 2021-05-04 DIAGNOSIS — I10 Essential (primary) hypertension: Secondary | ICD-10-CM | POA: Diagnosis not present

## 2021-05-04 DIAGNOSIS — Z79899 Other long term (current) drug therapy: Secondary | ICD-10-CM | POA: Diagnosis not present

## 2021-05-04 DIAGNOSIS — M545 Low back pain, unspecified: Secondary | ICD-10-CM | POA: Diagnosis not present

## 2021-05-05 DIAGNOSIS — M5416 Radiculopathy, lumbar region: Secondary | ICD-10-CM

## 2021-05-05 HISTORY — DX: Radiculopathy, lumbar region: M54.16

## 2021-05-13 ENCOUNTER — Other Ambulatory Visit (HOSPITAL_COMMUNITY): Payer: Self-pay

## 2021-05-13 DIAGNOSIS — M5432 Sciatica, left side: Secondary | ICD-10-CM | POA: Diagnosis not present

## 2021-05-13 DIAGNOSIS — R293 Abnormal posture: Secondary | ICD-10-CM | POA: Diagnosis not present

## 2021-05-13 DIAGNOSIS — M6281 Muscle weakness (generalized): Secondary | ICD-10-CM | POA: Diagnosis not present

## 2021-05-13 DIAGNOSIS — M2569 Stiffness of other specified joint, not elsewhere classified: Secondary | ICD-10-CM | POA: Diagnosis not present

## 2021-05-21 ENCOUNTER — Other Ambulatory Visit: Payer: Self-pay

## 2021-05-21 ENCOUNTER — Encounter (HOSPITAL_BASED_OUTPATIENT_CLINIC_OR_DEPARTMENT_OTHER): Payer: Self-pay | Admitting: Nurse Practitioner

## 2021-05-21 ENCOUNTER — Ambulatory Visit (HOSPITAL_BASED_OUTPATIENT_CLINIC_OR_DEPARTMENT_OTHER): Payer: 59 | Admitting: Nurse Practitioner

## 2021-05-21 ENCOUNTER — Other Ambulatory Visit (HOSPITAL_COMMUNITY): Payer: Self-pay

## 2021-05-21 VITALS — BP 124/76

## 2021-05-21 DIAGNOSIS — M79605 Pain in left leg: Secondary | ICD-10-CM | POA: Diagnosis not present

## 2021-05-21 DIAGNOSIS — I10 Essential (primary) hypertension: Secondary | ICD-10-CM | POA: Diagnosis not present

## 2021-05-21 DIAGNOSIS — D500 Iron deficiency anemia secondary to blood loss (chronic): Secondary | ICD-10-CM

## 2021-05-21 DIAGNOSIS — R202 Paresthesia of skin: Secondary | ICD-10-CM | POA: Diagnosis not present

## 2021-05-21 DIAGNOSIS — G44219 Episodic tension-type headache, not intractable: Secondary | ICD-10-CM | POA: Diagnosis not present

## 2021-05-21 DIAGNOSIS — L918 Other hypertrophic disorders of the skin: Secondary | ICD-10-CM

## 2021-05-21 DIAGNOSIS — R200 Anesthesia of skin: Secondary | ICD-10-CM

## 2021-05-21 MED ORDER — CYCLOBENZAPRINE HCL 10 MG PO TABS: 10.0000 mg | ORAL_TABLET | Freq: Three times a day (TID) | ORAL | 3 refills | Status: DC | PRN

## 2021-05-21 MED ORDER — GABAPENTIN 100 MG PO CAPS
ORAL_CAPSULE | ORAL | 3 refills | Status: DC
  Filled 2021-05-21: qty 260, 28d supply, fill #0

## 2021-05-21 NOTE — Patient Instructions (Signed)
I will let you know what the labs say and we will go from there  Let me know if the wrist brace doesn't seem to help

## 2021-05-22 ENCOUNTER — Encounter (HOSPITAL_BASED_OUTPATIENT_CLINIC_OR_DEPARTMENT_OTHER): Payer: Self-pay | Admitting: Nurse Practitioner

## 2021-05-22 ENCOUNTER — Encounter (HOSPITAL_BASED_OUTPATIENT_CLINIC_OR_DEPARTMENT_OTHER): Payer: Self-pay | Admitting: Obstetrics & Gynecology

## 2021-05-22 DIAGNOSIS — M6281 Muscle weakness (generalized): Secondary | ICD-10-CM | POA: Diagnosis not present

## 2021-05-22 DIAGNOSIS — R202 Paresthesia of skin: Secondary | ICD-10-CM

## 2021-05-22 DIAGNOSIS — R293 Abnormal posture: Secondary | ICD-10-CM | POA: Diagnosis not present

## 2021-05-22 DIAGNOSIS — L918 Other hypertrophic disorders of the skin: Secondary | ICD-10-CM | POA: Insufficient documentation

## 2021-05-22 DIAGNOSIS — R200 Anesthesia of skin: Secondary | ICD-10-CM | POA: Insufficient documentation

## 2021-05-22 DIAGNOSIS — M2569 Stiffness of other specified joint, not elsewhere classified: Secondary | ICD-10-CM | POA: Diagnosis not present

## 2021-05-22 DIAGNOSIS — M5432 Sciatica, left side: Secondary | ICD-10-CM | POA: Diagnosis not present

## 2021-05-22 HISTORY — DX: Anesthesia of skin: R20.0

## 2021-05-22 HISTORY — DX: Anesthesia of skin: R20.2

## 2021-05-22 LAB — CBC WITH DIFFERENTIAL/PLATELET
Basophils Absolute: 0.1 10*3/uL (ref 0.0–0.2)
Basos: 1 %
EOS (ABSOLUTE): 0.1 10*3/uL (ref 0.0–0.4)
Eos: 1 %
Hematocrit: 35.5 % (ref 34.0–46.6)
Hemoglobin: 11.9 g/dL (ref 11.1–15.9)
Immature Grans (Abs): 0 10*3/uL (ref 0.0–0.1)
Immature Granulocytes: 0 %
Lymphocytes Absolute: 1.8 10*3/uL (ref 0.7–3.1)
Lymphs: 22 %
MCH: 29.6 pg (ref 26.6–33.0)
MCHC: 33.5 g/dL (ref 31.5–35.7)
MCV: 88 fL (ref 79–97)
Monocytes Absolute: 0.7 10*3/uL (ref 0.1–0.9)
Monocytes: 9 %
Neutrophils Absolute: 5.2 10*3/uL (ref 1.4–7.0)
Neutrophils: 67 %
Platelets: 326 10*3/uL (ref 150–450)
RBC: 4.02 x10E6/uL (ref 3.77–5.28)
RDW: 13 % (ref 11.7–15.4)
WBC: 7.9 10*3/uL (ref 3.4–10.8)

## 2021-05-22 LAB — IRON,TIBC AND FERRITIN PANEL
Ferritin: 49 ng/mL (ref 15–150)
Iron Saturation: 16 % (ref 15–55)
Iron: 41 ug/dL (ref 27–159)
Total Iron Binding Capacity: 259 ug/dL (ref 250–450)
UIBC: 218 ug/dL (ref 131–425)

## 2021-05-22 NOTE — Assessment & Plan Note (Signed)
Concern over worsening iron deficiency anemia given the recent sensation of numbness and tingling in the mouth and lips with rapid movement.  This could indicate decreased hemoglobin levels.  We will plan to recheck these labs today. If labs continue to show an abnormality I feel we need to consider iron infusions.  She is agreeable to this if this is present.

## 2021-05-22 NOTE — Progress Notes (Signed)
Established Patient Office Visit  Subjective:  Patient ID: Caitlyn Richardson, female    DOB: Mar 06, 1981  Age: 41 y.o. MRN: [id number]  CC:  Chief Complaint  Patient presents with   Follow-up    Patient is continuing to have lower back pain and left hamstring pain. She has started PT since last OV. Patient did not do most recent steroid burst pack because she states the steroids didn't help the first time around. She does occasionally take flexeril but not often   Numbness    Patient states that on 01/04 she changed her eating habits and started to only consume water, and she has noticed that for the last week when she is at work ad she is walking to fast or turns to quickly she has a sensation of numbness or tingling in her mouth and lips. Occasionally the sensation will be in her legs as well. She states she also is having the numbness in her right wrist, she does have a brace that she wears at night.     HPI Caitlyn Richardson presents for paresthesias and ongoing thigh pain.  Paresthesias Today tells me that she has noticed a sensation of numbness and tingling in her mouth and lips when turning quickly or walking swiftly.  She reports that occasionally this will occur in her legs.  She has not had any chest pain, shortness of breath, dizziness.  She has no limitations with her range of motion.  She does report that prior to onset of this she changed her eating habits significantly and began only drinking water.  The symptoms have been occurring for about the past week.  She cannot associate that with anything particularly.  She does report she is still having heavy menstrual bleeding.  She is still taking the iron supplementation daily but has not had her hemoglobin levels checked in a few months.  She does have uterine fibroids which are needed being removed however she tells me that recently she was denied for surgery by insurance.  Hamstring pain She reports she is still having pain in the back  of her left leg in the hamstring.  She is still taking Flexeril as needed and this is helpful.  She did start physical therapy recently and feels that it is helping.  She is not helpful improvement but she tells me she feels she is headed in the right direction.  Hand numbness She endorses intermittent numbness and tingling in the right hand that has been going on for a few months.  She denies any known activity that exacerbates this but does report that she often wakes up in the middle of the night with numbness in the thumb and pointer finger with occasional involvement of the middle finger and into the palm of the hand.  She has been wearing a wrist brace at night but does not feel this is necessarily helping.  She denies any color change, cold sensation, or weakness in the hand.  She has no history of carpal tunnel syndrome.   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 ICD10 Conversion   Excessive and frequent menstruation with irregular cycle 10/09/2020   Female genital symptoms  07/23/2006   Formatting of this note might be different from the original. ICD10 Conversion   Iron deficiency anemia due to chronic blood loss 10/09/2020   Lumbosacral spondylosis without myelopathy 10/09/2020   Mixed hyperlipidemia 10/09/2020   Other chest pain 10/09/2020   Sciatica 10/09/2020    Past Surgical History:  Procedure Laterality Date   CESAREAN SECTION     MYOMECTOMY  2021    Family History  Problem Relation Age of Onset   Hypertension Maternal Grandfather    Heart attack Father    Hypertension Mother    Thyroid disease Mother    Hypertension Brother     Social History    Socioeconomic History   Marital status: Single    Spouse name: Not on file   Number of children: 2   Years of education: Not on file   Highest education level: Not on file  Occupational History   Not on file  Tobacco Use   Smoking status: Never   Smokeless tobacco: Never  Vaping Use   Vaping Use: Never used  Substance and Sexual Activity   Alcohol use: Yes    Alcohol/week: 3.0 standard drinks    Types: 3 Cans of beer per week   Drug use: Not Currently   Sexual activity: Yes    Partners: Male    Birth control/protection: Surgical    Comment: BTL  Other Topics Concern   Not on file  Social History Narrative   Not on file   Social Determinants of Health   Financial Resource Strain: Not on file  Food Insecurity: Not on file  Transportation Needs: Not on file  Physical Activity: Not on file  Stress: Not on file  Social Connections: Not on file  Intimate Partner Violence: Not At Risk   Fear of Current or Ex-Partner: No   Emotionally Abused: No   Physically Abused: No   Sexually Abused: No    Outpatient Medications Prior to Visit  Medication Sig Dispense Refill   amLODipine (NORVASC) 5 MG tablet Take 1 tablet  by mouth daily. 90 tablet 3   escitalopram (LEXAPRO) 10 MG tablet Take 1 tablet  by mouth at bedtime. 90 tablet 3   ferrous sulfate 325 (65 FE) MG tablet Take 325 mg by mouth daily with breakfast.     hydrOXYzine (VISTARIL) 25 MG capsule Take 1 capsule by mouth at bedtime and may repeat dose one time if needed. 180 capsule 3   Lidocaine 4 % PTCH Place onto the skin.     loteprednol (LOTEMAX) 0.5 % ophthalmic suspension 1 drop 2 (two) times daily.     olmesartan-hydrochlorothiazide (BENICAR HCT) 40-25 MG tablet Take 1 tablet by mouth daily. 90 tablet 3   cyclobenzaprine (FLEXERIL) 10 MG tablet Take 1 tablet (10 mg total) by mouth every 8 (eight) hours as needed for muscle spasms. 30 tablet 0   gabapentin (NEURONTIN) 100 MG capsule Take 1 to 3 (180m to 3037m  capsules as needed (up to 3 times a day) for neuralgia headache. 90 capsule 3   diclofenac Sodium (VOLTAREN) 1 % GEL Apply 2 g topically 4 (four) times daily.     ferrous sulfate 325 (65 FE) MG EC tablet Take 1 tablet (325 mg total) by mouth 3 (three) times daily with meals. Start with BID and increase to TID if tolerated (Patient not taking: Reported on 05/21/2021)  3   predniSONE (DELTASONE) 20 MG tablet Take 1 tablet (20 mg total) by mouth daily with breakfast. (Patient  not taking: Reported on 05/21/2021) 5 tablet 0   No facility-administered medications prior to visit.    No Known Allergies  ROS Review of Systems All review of systems negative except what is listed in the HPI    Objective:    Physical Exam Constitutional:      General: She is not in acute distress.    Appearance: Normal appearance.  HENT:     Head: Normocephalic and atraumatic.  Eyes:     Extraocular Movements: Extraocular movements intact.     Conjunctiva/sclera: Conjunctivae normal.     Pupils: Pupils are equal, round, and reactive to light.  Neck:     Vascular: No carotid bruit.  Cardiovascular:     Rate and Rhythm: Normal rate and regular rhythm.     Pulses: Normal pulses.     Heart sounds: Normal heart sounds.  Pulmonary:     Effort: Pulmonary effort is normal.     Breath sounds: Normal breath sounds.  Abdominal:     General: Bowel sounds are normal.     Palpations: Abdomen is soft.  Musculoskeletal:        General: No swelling or tenderness.     Right elbow: Normal.     Left elbow: Normal.     Right forearm: Normal.     Left forearm: Normal.     Right wrist: Normal.     Left wrist: Normal.     Right hand: Normal.     Left hand: Normal.     Cervical back: Normal range of motion.     Right lower leg: No edema.     Left lower leg: No edema.  Lymphadenopathy:     Cervical: No cervical adenopathy.  Skin:    General: Skin is warm and dry.     Capillary Refill: Capillary refill takes less than  2 seconds.  Neurological:     General: No focal deficit present.     Mental Status: She is alert and oriented to person, place, and time.     Cranial Nerves: No cranial nerve deficit.     Sensory: No sensory deficit.     Motor: No weakness.     Coordination: Coordination normal.     Gait: Gait normal.  Psychiatric:        Mood and Affect: Mood normal.        Behavior: Behavior normal.        Thought Content: Thought content normal.        Judgment: Judgment normal.    BP 124/76  Wt Readings from Last 3 Encounters:  04/09/21 182 lb 3.2 oz (82.6 kg)  03/12/21 181 lb (82.1 kg)  02/14/21 179 lb 3.2 oz (81.3 kg)     Health Maintenance Due  Topic Date Due   Pneumococcal Vaccine 10-84 Years old (1 - PCV) Never done    There are no preventive care reminders to display for this patient.  Lab Results  Component Value Date   TSH 2.460 10/09/2020   Lab Results  Component Value Date   WBC 7.9 05/21/2021   HGB 11.9 05/21/2021   HCT 35.5 05/21/2021   MCV 88 05/21/2021   PLT 326 05/21/2021   Lab Results  Component Value Date   NA 144 10/09/2020   K 3.7 10/09/2020   CO2 21 10/09/2020   GLUCOSE 117 (H) 10/09/2020   BUN 11 10/09/2020   CREATININE 0.75 10/09/2020   BILITOT 0.5 10/09/2020   ALKPHOS 62 10/09/2020   AST  10 10/09/2020   ALT 11 10/09/2020   PROT 7.5 10/09/2020   ALBUMIN 4.5 10/09/2020   CALCIUM 9.7 10/09/2020   EGFR 103 10/09/2020   Lab Results  Component Value Date   CHOL 196 10/09/2020   Lab Results  Component Value Date   HDL 45 10/09/2020   Lab Results  Component Value Date   LDLCALC 137 (H) 10/09/2020   Lab Results  Component Value Date   TRIG 79 10/09/2020   Lab Results  Component Value Date   CHOLHDL 4.4 10/09/2020   Lab Results  Component Value Date   HGBA1C 5.5 10/09/2020      Assessment & Plan:   Problem List Items Addressed This Visit     Essential hypertension    Blood pressure very well controlled today. Continue  medication as prescribed.  No alarm symptoms present today.      Iron deficiency anemia due to chronic blood loss - Primary    Concern over worsening iron deficiency anemia given the recent sensation of numbness and tingling in the mouth and lips with rapid movement.  This could indicate decreased hemoglobin levels.  We will plan to recheck these labs today. If labs continue to show an abnormality I feel we need to consider iron infusions.  She is agreeable to this if this is present.      Relevant Medications   ferrous sulfate 325 (65 FE) MG tablet   Other Relevant Orders   CBC with Differential/Platelet (Completed)   Iron, TIBC and Ferritin Panel (Completed)   Episodic tension-type headache, not intractable    Patient endorses significant improvement of headaches with gabapentin.  We will send refills today.      Relevant Medications   cyclobenzaprine (FLEXERIL) 10 MG tablet   gabapentin (NEURONTIN) 100 MG capsule   Numbness and tingling in right hand    Numbness and tingling of the right hand is consistent with radial nerve involvement.  No swelling, decreased range of motion, or injuries are noted.  Negative Phalen's and Tinel's test today.  Unable to reproduce pain.  No shoulder abnormalities noted. Etiology appears to be neuropathic in nature however unable to discern today. We will obtain some labs for further evaluation.  In the meantime recommend patient continue to wear wrist brace to see if this is helpful with symptoms.  Recommend avoiding laying on that arm.  Recommend that she monitor to see if there are any specific movements or activities that seem to exacerbate the pain. If the symptoms continue we can send her to neurology for nerve conduction study or additional testing.      Relevant Orders   CBC with Differential/Platelet (Completed)   Iron, TIBC and Ferritin Panel (Completed)   Skin tag    Skin tag present to the right cheek. Patient reports that this does fall  off fairly frequently but tends to grow right back. Discussed with patient given the location and risks for scarring I do think it would be best to have removal by dermatology as they would likely need to go a little deeper than I am comfortable with and this setting and with my training. She is agreeable to this decision.  We will send referral today.      Relevant Orders   Ambulatory referral to Dermatology   Other Visit Diagnoses     Pain of left lower extremity       Relevant Medications   cyclobenzaprine (FLEXERIL) 10 MG tablet  Meds ordered this encounter  Medications   cyclobenzaprine (FLEXERIL) 10 MG tablet    Sig: Take 1 tablet (10 mg total) by mouth every 8 (eight) hours as needed for muscle spasms.    Dispense:  30 tablet    Refill:  3   gabapentin (NEURONTIN) 100 MG capsule    Sig: Take 1 to 3 (14m to 3019m capsules as needed (up to 3 times a day) for neuralgia.    Dispense:  260 capsule    Refill:  3    Follow-up: Return for tbd.    SaOrma RenderNP

## 2021-05-22 NOTE — Assessment & Plan Note (Signed)
Skin tag present to the right cheek. Patient reports that this does fall off fairly frequently but tends to grow right back. Discussed with patient given the location and risks for scarring I do think it would be best to have removal by dermatology as they would likely need to go a little deeper than I am comfortable with and this setting and with my training. She is agreeable to this decision.  We will send referral today.

## 2021-05-22 NOTE — Assessment & Plan Note (Signed)
Numbness and tingling of the right hand is consistent with radial nerve involvement.  No swelling, decreased range of motion, or injuries are noted.  Negative Phalen's and Tinel's test today.  Unable to reproduce pain.  No shoulder abnormalities noted. Etiology appears to be neuropathic in nature however unable to discern today. We will obtain some labs for further evaluation.  In the meantime recommend patient continue to wear wrist brace to see if this is helpful with symptoms.  Recommend avoiding laying on that arm.  Recommend that she monitor to see if there are any specific movements or activities that seem to exacerbate the pain. If the symptoms continue we can send her to neurology for nerve conduction study or additional testing.

## 2021-05-22 NOTE — Assessment & Plan Note (Signed)
Patient endorses significant improvement of headaches with gabapentin.  We will send refills today.

## 2021-05-22 NOTE — Assessment & Plan Note (Signed)
Blood pressure very well controlled today. Continue medication as prescribed.  No alarm symptoms present today.

## 2021-05-23 ENCOUNTER — Telehealth (HOSPITAL_BASED_OUTPATIENT_CLINIC_OR_DEPARTMENT_OTHER): Payer: Self-pay

## 2021-05-23 DIAGNOSIS — R202 Paresthesia of skin: Secondary | ICD-10-CM

## 2021-05-23 NOTE — Telephone Encounter (Signed)
Results released by [name], AGNP and reviewed by patient via [place] patient to contact the office with any questions or concerns.  Lab orders added and faxed to [facility]

## 2021-05-23 NOTE — Telephone Encounter (Signed)
-----   Message from Orma Render, NP sent at 05/23/2021  8:05 AM EST ----- Please add on vitamin B-12 and D3 to patient's labs if possible.  Diagnosis paresthesias.

## 2021-05-26 DIAGNOSIS — S8992XA Unspecified injury of left lower leg, initial encounter: Secondary | ICD-10-CM | POA: Diagnosis not present

## 2021-05-26 DIAGNOSIS — S63004A Unspecified dislocation of right wrist and hand, initial encounter: Secondary | ICD-10-CM | POA: Diagnosis not present

## 2021-05-26 DIAGNOSIS — M79662 Pain in left lower leg: Secondary | ICD-10-CM | POA: Diagnosis not present

## 2021-05-26 DIAGNOSIS — S8012XA Contusion of left lower leg, initial encounter: Secondary | ICD-10-CM | POA: Diagnosis not present

## 2021-05-26 DIAGNOSIS — S62354A Nondisplaced fracture of shaft of fourth metacarpal bone, right hand, initial encounter for closed fracture: Secondary | ICD-10-CM | POA: Diagnosis not present

## 2021-05-26 DIAGNOSIS — I10 Essential (primary) hypertension: Secondary | ICD-10-CM | POA: Diagnosis not present

## 2021-05-26 DIAGNOSIS — S63501A Unspecified sprain of right wrist, initial encounter: Secondary | ICD-10-CM | POA: Diagnosis not present

## 2021-05-26 DIAGNOSIS — S6991XA Unspecified injury of right wrist, hand and finger(s), initial encounter: Secondary | ICD-10-CM | POA: Diagnosis not present

## 2021-05-27 ENCOUNTER — Other Ambulatory Visit (HOSPITAL_BASED_OUTPATIENT_CLINIC_OR_DEPARTMENT_OTHER): Payer: Self-pay | Admitting: Obstetrics & Gynecology

## 2021-05-27 DIAGNOSIS — N920 Excessive and frequent menstruation with regular cycle: Secondary | ICD-10-CM

## 2021-05-27 DIAGNOSIS — S62304A Unspecified fracture of fourth metacarpal bone, right hand, initial encounter for closed fracture: Secondary | ICD-10-CM | POA: Diagnosis not present

## 2021-05-27 DIAGNOSIS — D500 Iron deficiency anemia secondary to blood loss (chronic): Secondary | ICD-10-CM

## 2021-05-27 DIAGNOSIS — D251 Intramural leiomyoma of uterus: Secondary | ICD-10-CM

## 2021-05-27 MED ORDER — NORETHINDRONE ACETATE 5 MG PO TABS: 5.0000 mg | ORAL_TABLET | Freq: Every day | ORAL | 2 refills | Status: DC

## 2021-05-28 DIAGNOSIS — R0789 Other chest pain: Secondary | ICD-10-CM | POA: Diagnosis not present

## 2021-05-28 DIAGNOSIS — Z041 Encounter for examination and observation following transport accident: Secondary | ICD-10-CM | POA: Diagnosis not present

## 2021-05-28 DIAGNOSIS — I10 Essential (primary) hypertension: Secondary | ICD-10-CM | POA: Diagnosis not present

## 2021-05-28 DIAGNOSIS — Z79899 Other long term (current) drug therapy: Secondary | ICD-10-CM | POA: Diagnosis not present

## 2021-05-29 ENCOUNTER — Other Ambulatory Visit: Payer: Self-pay | Admitting: Obstetrics & Gynecology

## 2021-05-29 ENCOUNTER — Telehealth (HOSPITAL_BASED_OUTPATIENT_CLINIC_OR_DEPARTMENT_OTHER): Payer: Self-pay | Admitting: Obstetrics & Gynecology

## 2021-05-29 DIAGNOSIS — R0789 Other chest pain: Secondary | ICD-10-CM | POA: Diagnosis not present

## 2021-05-29 DIAGNOSIS — Z79899 Other long term (current) drug therapy: Secondary | ICD-10-CM | POA: Diagnosis not present

## 2021-05-29 DIAGNOSIS — D259 Leiomyoma of uterus, unspecified: Secondary | ICD-10-CM

## 2021-05-29 DIAGNOSIS — I10 Essential (primary) hypertension: Secondary | ICD-10-CM | POA: Diagnosis not present
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
## 2021-05-30 ENCOUNTER — Encounter (HOSPITAL_BASED_OUTPATIENT_CLINIC_OR_DEPARTMENT_OTHER): Payer: Self-pay | Admitting: Nurse Practitioner

## 2021-05-30 ENCOUNTER — Other Ambulatory Visit: Payer: Self-pay

## 2021-05-30 ENCOUNTER — Ambulatory Visit (INDEPENDENT_AMBULATORY_CARE_PROVIDER_SITE_OTHER): Payer: 59 | Admitting: Nurse Practitioner

## 2021-05-30 ENCOUNTER — Other Ambulatory Visit (HOSPITAL_COMMUNITY): Payer: Self-pay

## 2021-05-30 VITALS — BP 116/64 | HR 59 | Ht 67.0 in | Wt 182.0 lb

## 2021-05-30 DIAGNOSIS — S8992XA Unspecified injury of left lower leg, initial encounter: Secondary | ICD-10-CM | POA: Diagnosis not present

## 2021-05-30 DIAGNOSIS — S62304A Unspecified fracture of fourth metacarpal bone, right hand, initial encounter for closed fracture: Secondary | ICD-10-CM

## 2021-05-30 HISTORY — DX: Unspecified injury of left lower leg, initial encounter: S89.92XA

## 2021-05-30 MED ORDER — MELOXICAM 15 MG PO TABS
15.0000 mg | ORAL_TABLET | Freq: Every day | ORAL | 0 refills | Status: DC
  Filled 2021-05-30: qty 30, 30d supply, fill #0

## 2021-05-30 NOTE — Assessment & Plan Note (Signed)
Previous x-rays unable to be viewed today as they are not within the system.  Patient will send images from the online patient portal if she is able. At this time symptoms are consistent with normal pathology of metacarpal fracture with significant edema and ecchymosis present. Discussed with the patient the importance of keeping the fingers, hand, and wrist immobilized to allow for appropriate healing and limit risk of displacing the fracture. Also discussed importance of utilizing ice to the area 20 minutes at a time at least 3 times a day to help with reduction of pain and swelling. Evaluated patient's ability to utilize brace and ensured proper fit in office today.  Recommendations for nighttime bracing discussed as well as elevation. Discussion with Dr. Burnard Bunting and sports medicine performed with evaluation by him today as well.  At this time do not feel additional imaging is necessary however encouraged the patient to monitor closely and notify if any new symptoms present. Plan to follow-up with orthopedics as scheduled or sooner if needed.
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
## 2021-05-30 NOTE — Patient Instructions (Addendum)
I want you to ice your hand for 20 minutes at a time at least 3 times a day.  I have sent in meloxicam and flexeril for you. The meloxicam will help with the inflammation and pain in both your hand and chest   Don't use the hand for at least the next 3 weeks. Keep it immobilized as much as possible.

## 2021-05-30 NOTE — Assessment & Plan Note (Signed)
Significant area of edema and ecchymosis present on the left anterior portion of the shin.  Area is approximately 7 to 8 cm in length. Discussed with patient the importance of utilization of ice to help with edema and pain.   Discussed that nature of the healing of this type of injury and expectation that it may take several months for full resolution.  She expressed understanding.  She has no decreased range of motion or limitations in weightbearing at this time. Discussed with patient if symptoms worsen or fail to improve to please contact the office.
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
## 2021-06-05 ENCOUNTER — Encounter (HOSPITAL_BASED_OUTPATIENT_CLINIC_OR_DEPARTMENT_OTHER): Payer: Self-pay | Admitting: Nurse Practitioner

## 2021-06-06 DIAGNOSIS — M5432 Sciatica, left side: Secondary | ICD-10-CM | POA: Diagnosis not present

## 2021-06-06 DIAGNOSIS — R293 Abnormal posture: Secondary | ICD-10-CM | POA: Diagnosis not present

## 2021-06-06 DIAGNOSIS — M6281 Muscle weakness (generalized): Secondary | ICD-10-CM | POA: Diagnosis not present

## 2021-06-06 DIAGNOSIS — M2569 Stiffness of other specified joint, not elsewhere classified: Secondary | ICD-10-CM | POA: Diagnosis not present

## 2021-06-06 NOTE — Telephone Encounter (Signed)
Printed FMLA paper work that patient sent.

## 2021-06-07 ENCOUNTER — Encounter (HOSPITAL_BASED_OUTPATIENT_CLINIC_OR_DEPARTMENT_OTHER): Payer: Self-pay | Admitting: Nurse Practitioner

## 2021-06-07 ENCOUNTER — Encounter: Payer: Self-pay | Admitting: *Deleted

## 2021-06-07 ENCOUNTER — Other Ambulatory Visit: Payer: Self-pay | Admitting: Diagnostic Radiology

## 2021-06-07 ENCOUNTER — Ambulatory Visit
Admission: RE | Admit: 2021-06-07 | Discharge: 2021-06-07 | Disposition: A | Discharge status: Home / Self Care | Payer: 59 | Source: Ambulatory Visit | Attending: Obstetrics & Gynecology | Admitting: Obstetrics & Gynecology

## 2021-06-07 DIAGNOSIS — D259 Leiomyoma of uterus, unspecified: Secondary | ICD-10-CM

## 2021-06-07 DIAGNOSIS — D500 Iron deficiency anemia secondary to blood loss (chronic): Secondary | ICD-10-CM | POA: Diagnosis not present

## 2021-06-07 DIAGNOSIS — S62304A Unspecified fracture of fourth metacarpal bone, right hand, initial encounter for closed fracture: Secondary | ICD-10-CM

## 2021-06-07 DIAGNOSIS — N920 Excessive and frequent menstruation with regular cycle: Secondary | ICD-10-CM | POA: Diagnosis not present

## 2021-06-07 HISTORY — PX: IR RADIOLOGIST EVAL & MGMT: IMG5224

## 2021-06-07 LAB — VITAMIN B12: Vitamin B-12: 313 pg/mL (ref 232–1245)

## 2021-06-07 LAB — VITAMIN D 1,25 DIHYDROXY
Vitamin D 1, 25 (OH)2 Total: 54 pg/mL
Vitamin D2 1, 25 (OH)2: <10 pg/mL
Vitamin D3 1, 25 (OH)2: 52 pg/mL

## 2021-06-07 LAB — SPECIMEN STATUS REPORT

## 2021-06-07 NOTE — Consult Note (Signed)
Chief Complaint: Patient was seen in consultation today for uterine fibroids and menorrhagia  at the request of [place] [facility]  Referring Physician(s): Miller,Mary S  History of Present Illness: Caitlyn Richardson is a 41 y.o. female with menorrhagia, uterine fibroids and iron deficiency anemia related to chronic blood loss.  Patient reports heavy menstrual bleeding since 2020.  Patient had hysteroscopy with myomectomy and D&C in 2021.  Patient reports no significant improvement in the menorrhagia following that procedure.  Menstrual periods usually last 4 to 5 days with 2 days of heavy bleeding.  She is changing pads every 2-3 hours during the heavy bleeding days.  She has cramping associated with the menstrual periods which has been controlled using muscle relaxers.  She complains of constipation but no urinary symptoms.  Patient was recently started on Aygestin but she has stopped it because she was not tolerating the medication and was having excessive sweating.  Pregnancy history is G2, P2 with tubal ligation in 2009.  Pap smear on 02/14/2021 was negative.  Past medical history significant for hypertension and iron deficiency anemia and pelvic inflammatory disease.  Patient reports an episode of PID last year.  She is not diabetic.  She denies chest pain, fevers, chills or urinary symptoms.  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 ICD10 Conversion   Excessive and frequent menstruation with irregular cycle 10/09/2020   Female genital symptoms 07/23/2006   Formatting of this note might be different from the original. ICD10 Conversion   Iron deficiency anemia due  to chronic blood loss 10/09/2020   Lumbosacral spondylosis without myelopathy 10/09/2020   Mixed hyperlipidemia 10/09/2020   Other chest pain 10/09/2020   Sciatica 10/09/2020    Past Surgical History:  Procedure Laterality Date   CESAREAN SECTION     MYOMECTOMY  2021    Allergies: Patient has no known allergies.  Medications: Prior to Admission medications   Medication Sig Start Date End Date Taking? Authorizing Provider  amLODipine (NORVASC) 5 MG tablet Take 1 tablet  by mouth daily. 10/09/20   Orma Render, NP  cyclobenzaprine (FLEXERIL) 10 MG tablet Take 1 tablet (10 mg total) by mouth every 8 (eight) hours as needed for muscle spasms. 05/21/21   Orma Render, NP  escitalopram (LEXAPRO) 10 MG tablet Take 1 tablet  by mouth at bedtime. 02/05/21   Orma Render, NP  ferrous sulfate 325 (65 FE) MG tablet Take 325 mg by mouth daily with breakfast.    [provider]  gabapentin (NEURONTIN) 100 MG capsule Take 1 to 3 (100mg  to 300mg ) capsules as needed (up to 3 times a day) for neuralgia. 05/21/21   Orma Render, NP  HYDROcodone-acetaminophen (NORCO/VICODIN) 5-325 MG tablet Take 1 tablet by mouth every 6 (six) hours as needed. 05/27/21   [provider]  hydrOXYzine (VISTARIL) 25 MG capsule Take 1 capsule by mouth at bedtime and may repeat dose one time if needed. 02/05/21   Orma Render, NP  ibuprofen (ADVIL) 600 MG tablet Take 600 mg by mouth every 6 (six) hours as needed. 05/26/21   [provider]  loteprednol (LOTEMAX) 0.5 % ophthalmic suspension 1 drop  2 (two) times daily. 09/11/20   [provider]  meloxicam (MOBIC) 15 MG tablet Take 1 tablet (15 mg total) by mouth daily. 05/30/21   Orma Render, NP  norethindrone (AYGESTIN) 5 MG tablet Take 1 tablet (5 mg total) by mouth daily. 05/27/21   Megan Salon, MD  olmesartan-hydrochlorothiazide (BENICAR HCT) 40-25 MG tablet Take 1 tablet by mouth daily. 10/09/20   Orma Render, NP     Family History  Problem  Relation Age of Onset   Hypertension Maternal Grandfather    Heart attack Father    Hypertension Mother    Thyroid disease Mother    Hypertension Brother     Social History   Socioeconomic History   Marital status: Single    Spouse name: Not on file   Number of children: 2   Years of education: Not on file   Highest education level: Not on file  Occupational History   Not on file  Tobacco Use   Smoking status: Never   Smokeless tobacco: Never  Vaping Use   Vaping Use: Never used  Substance and Sexual Activity   Alcohol use: Yes    Alcohol/week: 3.0 standard drinks    Types: 3 Cans of beer per week   Drug use: Not Currently   Sexual activity: Yes    Partners: Male    Birth control/protection: Surgical    Comment: BTL  Other Topics Concern   Not on file  Social History Narrative   Not on file   Social Determinants of Health   Financial Resource Strain: Not on file  Food Insecurity: Not on file  Transportation Needs: Not on file  Physical Activity: Not on file  Stress: Not on file  Social Connections: Not on file      Review of Systems  Constitutional: Negative.   Respiratory: Negative.    Cardiovascular: Negative.   Gastrointestinal:  Positive for constipation.  Genitourinary:  Positive for menstrual problem and pelvic pain.   Vital Signs: There were no vitals taken for this visit.  Physical Exam Constitutional:      Appearance: She is not ill-appearing.  Cardiovascular:     Rate and Rhythm: Normal rate and regular rhythm.     Pulses: Normal pulses.     Heart sounds: Normal heart sounds.  Pulmonary:     Effort: Pulmonary effort is normal.     Breath sounds: Normal breath sounds.  Abdominal:     General: Abdomen is flat. There is no distension.     Palpations: Abdomen is soft.     Tenderness: There is no abdominal tenderness.  Neurological:     Mental Status: She is alert.       Imaging: CLINICAL DATA:  Menorrhagia, history of fibroids,  history of iron deficiency anemia   EXAM: TRANSABDOMINAL AND TRANSVAGINAL ULTRASOUND OF PELVIS   TECHNIQUE: Transvaginal ultrasound examination of the pelvis was performed.      FINDINGS: Uterus: 10.4 x 6.1 x 6.4. Fibroids noted measuring 3.4 x 2.2cm, 1.3 x 1.2cm and 3.2 x 2.6cm, intramural.  Volume: 242.14ml   Endometrial thickness:  1.16mm   Right ovary:  3.3 x 2.8 x 2.7cm.  Volume:  13.39ml   Left ovary:  2.1 x 3.9 x 2.4cm.  Volume:  10.63ml   Other findings:  No abnormal free fluid.    Labs:  CBC: Recent Labs    12/18/20 1311 02/14/21 1637 03/12/21 1438 05/21/21 1702  WBC 7.5 8.8 8.4 7.9  HGB 11.5*  12.3 11.7* 11.9  HCT 36.2 37.3 35.4* 35.5  PLT 321 338 330 326    COAGS: No results for input(s): INR, APTT in the last 8760 hours.  BMP: Recent Labs    10/09/20 0913  NA 144  K 3.7  CL 104  CO2 21  GLUCOSE 117*  BUN 11  CALCIUM 9.7  CREATININE 0.75    LIVER FUNCTION TESTS: Recent Labs    10/09/20 0913  BILITOT 0.5  AST 10  ALT 11  ALKPHOS 62  PROT 7.5  ALBUMIN 4.5    TUMOR MARKERS: No results for input(s): AFPTM, CEA, CA199, CHROMGRNA in the last 8760 hours.  Assessment and Plan:  41 year old female with menorrhagia, uterine fibroids and iron deficiency anemia.  Patient has been dealing with the menorrhagia since 2020.  Patient has been evaluated by gynecology and radiofrequency ablation was denied by insurance.  Patient is seeking other options for treatment of the uterine fibroids and menorrhagia.  Patient is not interested in hysterectomy at this time.  She has had a tubal ligation and no interest in future pregnancies.  Prior pelvic ultrasound demonstrates uterine fibroids.  We discussed uterine artery embolization procedure in depth.  We discussed the post procedure expectations including postembolization syndrome.  We discussed staying in the hospital overnight for observation to deal with the postembolization symptoms.  Patient understands  that the recovery period following the procedure can take up to 2 to 3 weeks.  We discussed the risks of the procedure including bleeding, infection and vascular injury.  Specifically, we discussed the risks of infection since the patient reports a history of PID.  Despite having episode of PID in the past, I feel that she is low risk for infection based on her overall good health and no evidence of recurrent infections.  Based on the patient's history, physical exam and prior imaging, she appears to be a good candidate for uterine artery embolization.  Patient will need a pelvic MRI, with and without contrast to evaluate the fibroids prior to the procedure.  We will schedule the patient for the pelvic MRI and let her know if she is a candidate for the procedure after we get the results.  If she is a candidate for uterine artery embolization, we can schedule the procedure at her convenience.  Thank you for this interesting consult.  I greatly enjoyed meeting Tarisa Paola and look forward to participating in their care.  A copy of this report was sent to the requesting provider on this date.  Electronically Signed: Burman Riis 06/07/2021, 9:14 AM   I spent a total of  30 Minutes   in face to face in clinical consultation, greater than 50% of which was counseling/coordinating care for menorrhagia and uterine fibroids.

## 2021-06-10 ENCOUNTER — Ambulatory Visit (HOSPITAL_COMMUNITY)
Admission: RE | Admit: 2021-06-10 | Discharge: 2021-06-10 | Disposition: A | Discharge status: Home / Self Care | Payer: 59 | Source: Ambulatory Visit | Attending: Diagnostic Radiology | Admitting: Diagnostic Radiology

## 2021-06-10 DIAGNOSIS — D259 Leiomyoma of uterus, unspecified: Secondary | ICD-10-CM | POA: Insufficient documentation

## 2021-06-10 DIAGNOSIS — D251 Intramural leiomyoma of uterus: Secondary | ICD-10-CM | POA: Diagnosis not present

## 2021-06-10 DIAGNOSIS — D252 Subserosal leiomyoma of uterus: Secondary | ICD-10-CM | POA: Diagnosis not present

## 2021-06-10 DIAGNOSIS — N83201 Unspecified ovarian cyst, right side: Secondary | ICD-10-CM | POA: Diagnosis not present

## 2021-06-10 IMAGING — MR MR PELVIS WO/W CM
18 of 19 series · 46 of 48 positions shown · IV contrast (GADAVIST)
Comparison: CT on [DATE]

CLINICAL DATA: Symptomatic fibroids.  Treatment planning.

EXAM:
MRI PELVIS WITHOUT AND WITH CONTRAST
TECHNIQUE: Multiplanar multisequence MR imaging of the pelvis was performed
both before and after administration of intravenous contrast.
CONTRAST:  8mL GADAVIST GADOBUTROL 1 MMOL/ML IV SOLN

[Series 2: T2 · coronal · 6.0mm · 1.56mm/px · 2 of 30 slices shown (1 of 3)]
[im 1/30]
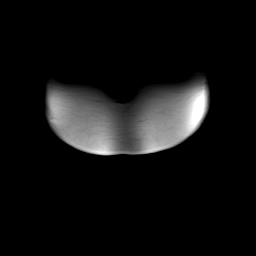
[im 30/30]
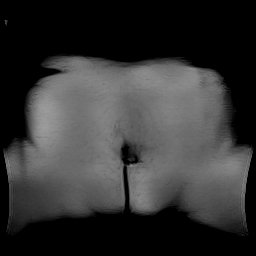

[Series 3: T2 · axial · 5.0mm · 0.51mm/px · z∈[-43,+155]mm · 2 of 34 slices shown (2 of 3)]
[im 1/34]
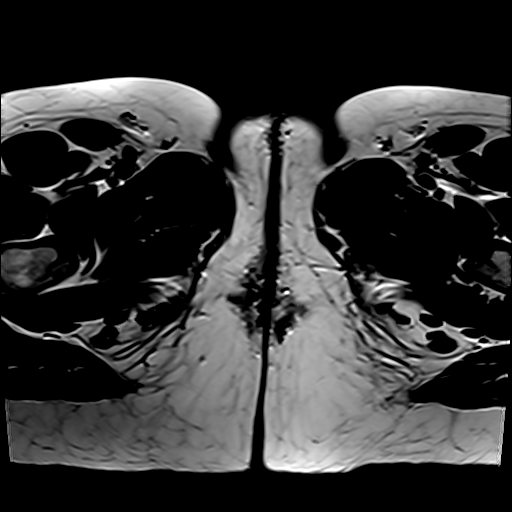
[im 34/34]
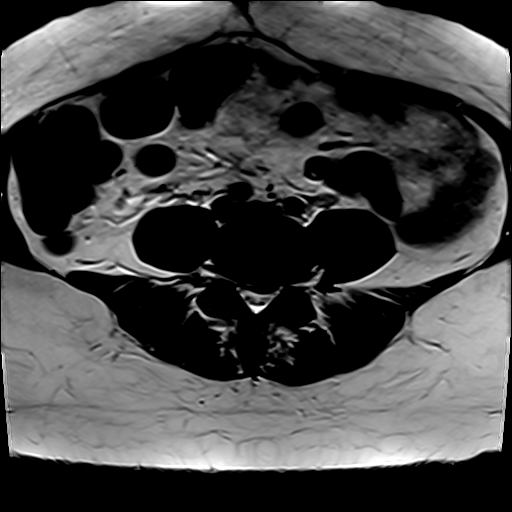

[Series 4: T2 · coronal · 4.0mm · 0.51mm/px · 2 of 34 slices shown (3 of 3)]
[im 1/34]
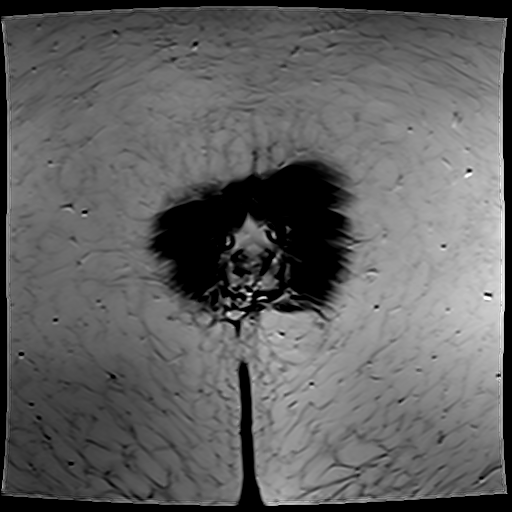
[im 34/34]
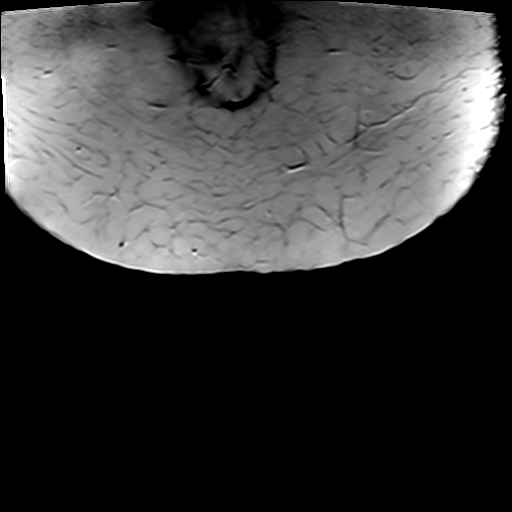

[Series 5: T2 fat-sat · axial · 5.0mm · 0.51mm/px · 1 of 34 slices shown]
[im 1/34]
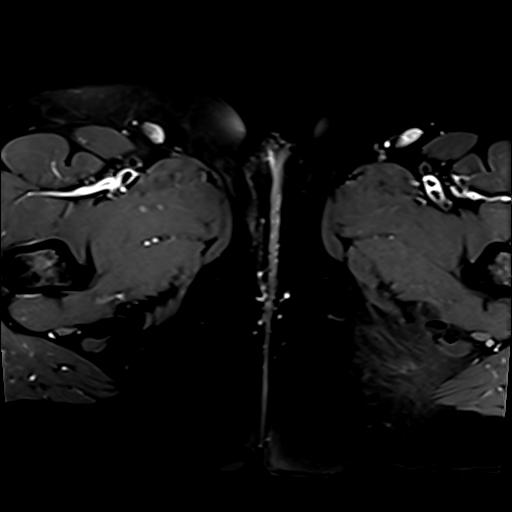

[Series 7: T1 · axial · 4.0mm · 0.84mm/px · z∈[-74,+210]mm · 3 of 72 slices shown (1 of 2)]
[im 1/72]
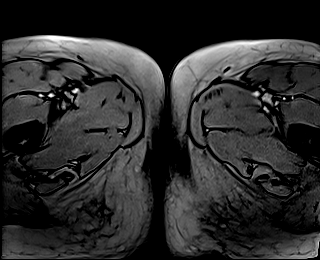
[im 36/72]
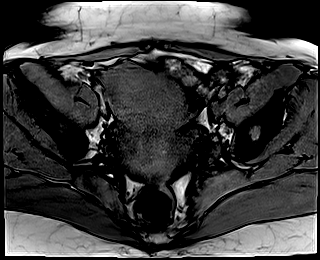
[im 72/72]
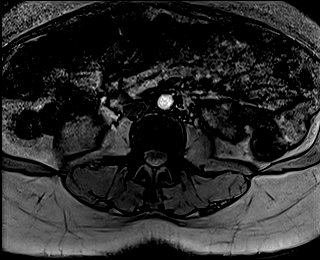

[Series 8: T1 · axial · 4.0mm · 0.84mm/px · z∈[-74,+210]mm · 3 of 72 slices shown (2 of 2)]
[im 1/72]
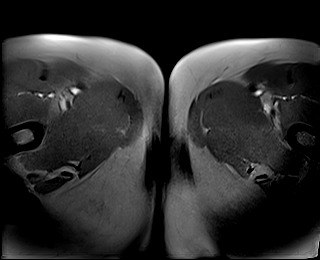
[im 36/72]
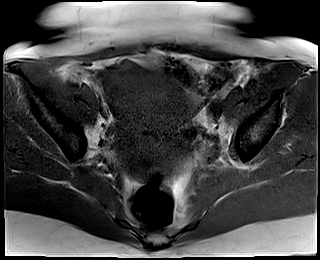
[im 72/72]
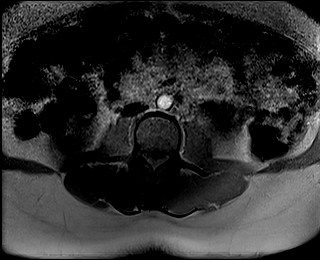

[Series 9: DWI · axial · 5.0mm · 2.80mm/px · z∈[-61,+142]mm · 4 of 90 slices shown (1 of 3)]
[im 1/90]
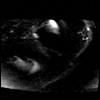
[im 30/90]
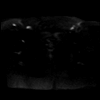
[im 60/90]
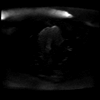
[im 90/90]
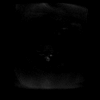

[Series 10: DWI · axial · 5.0mm · 2.80mm/px · 1 of 30 slices shown (2 of 3)]
[im 1/30]
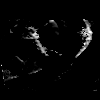

[Series 11: DWI · axial · 5.0mm · 2.80mm/px · 1 of 30 slices shown (3 of 3)]
[im 1/30]
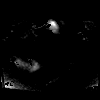

[Series 13: T1 dynamic · axial · 3.0mm · 0.84mm/px · z∈[-62,+175]mm · 3 of 80 slices shown (1 of 7)]
[im 1/80]
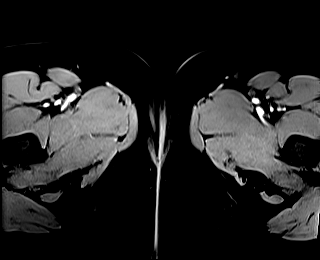
[im 40/80]
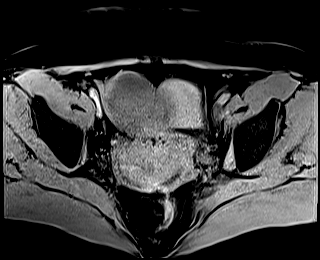
[im 80/80]
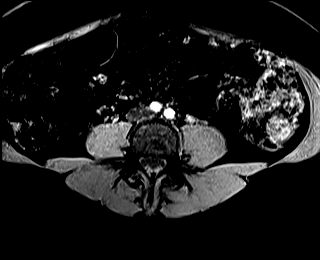

[Series 17: T1 dynamic · axial · 3.0mm · 0.84mm/px · z∈[-62,+175]mm · 3 of 80 slices shown (2 of 7)]
[im 1/80]
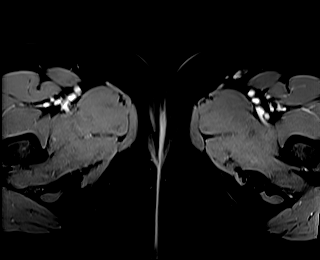
[im 40/80]
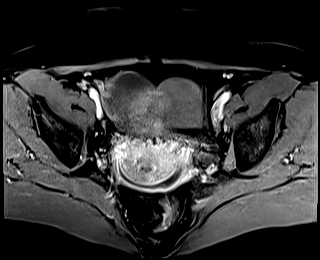
[im 80/80]
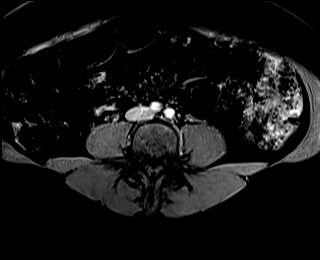

[Series 18: T1 dynamic · axial · 3.0mm · 0.84mm/px · z∈[-62,+175]mm · 3 of 80 slices shown (3 of 7)]
[im 1/80]
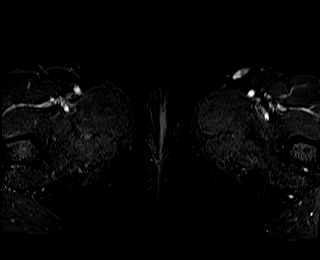
[im 40/80]
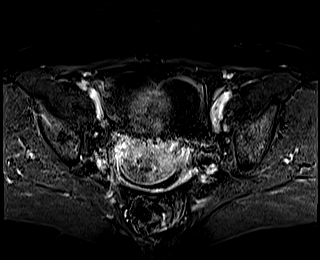
[im 80/80]
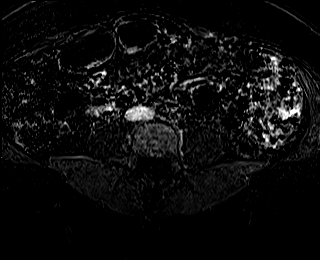

[Series 21: T1 dynamic · axial · 3.0mm · 0.84mm/px · z∈[-62,+175]mm · 3 of 80 slices shown (4 of 7)]
[im 1/80]
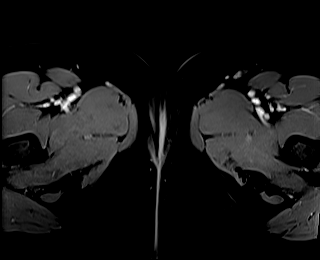
[im 40/80]
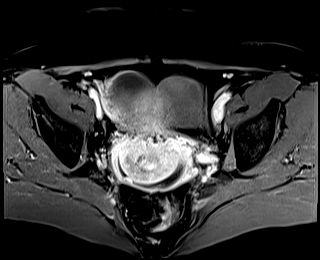
[im 80/80]
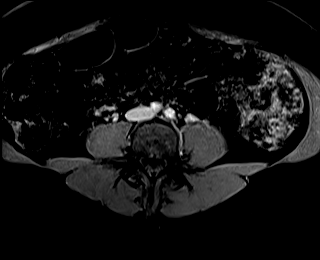

[Series 22: T1 dynamic · axial · 3.0mm · 0.84mm/px · z∈[-62,+175]mm · 3 of 80 slices shown (5 of 7)]
[im 1/80]
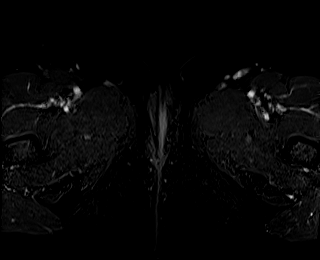
[im 40/80]
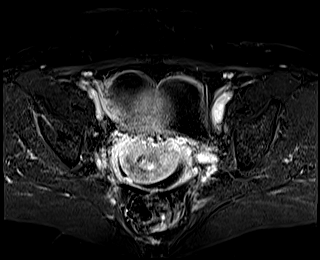
[im 80/80]
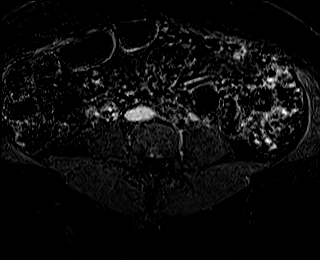

[Series 25: T1 dynamic · axial · 3.0mm · 0.84mm/px · z∈[-62,+175]mm · 3 of 80 slices shown (6 of 7)]
[im 1/80]
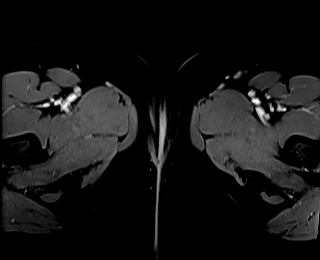
[im 40/80]
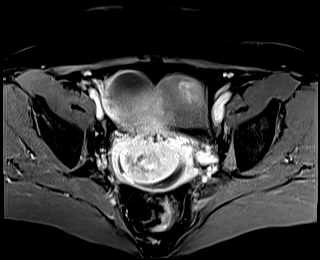
[im 80/80]
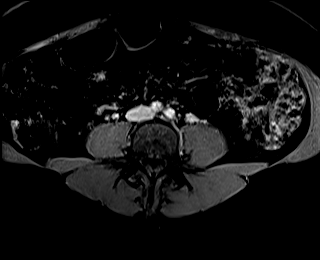

[Series 26: T1 dynamic · axial · 3.0mm · 0.84mm/px · z∈[-62,+175]mm · 3 of 80 slices shown (7 of 7)]
[im 1/80]
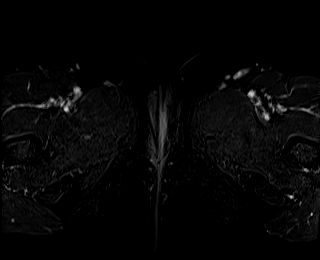
[im 40/80]
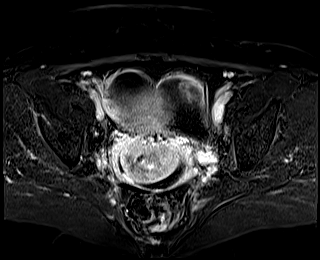
[im 80/80]
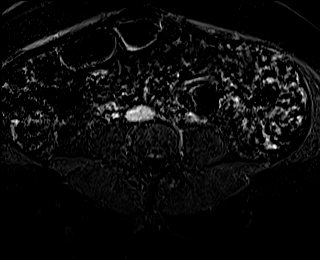

[Series 28: T1 dynamic post-contrast · axial · 3.0mm · 1.06mm/px · z∈[-58,+179]mm · 3 of 80 slices shown (1 of 2)]
[im 1/80]
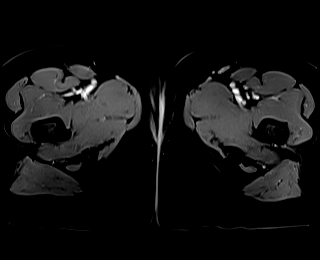
[im 40/80]
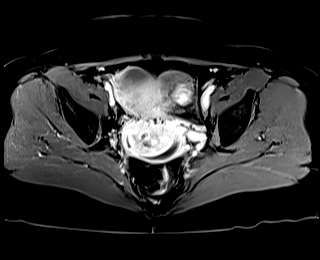
[im 80/80]
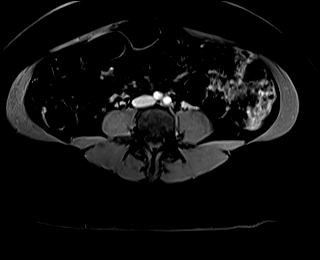

[Series 30: T1 dynamic post-contrast · sagittal · 3.0mm · 0.88mm/px · 3 of 80 slices shown (2 of 2)]
[im 1/80]
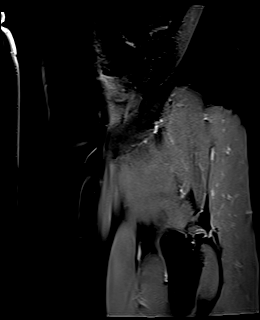
[im 40/80]
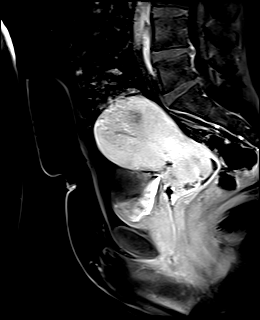
[im 80/80]
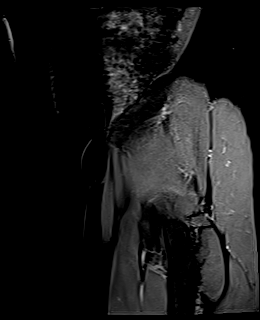

[46 of 48 positions shown; findings below may reference images not displayed]

FINDINGS: Lower Urinary Tract: No bladder or urethral abnormality identified.

Bowel:  Unremarkable visualized pelvic bowel loops.

Vascular/Lymphatic: No pathologically enlarged lymph nodes or other
significant abnormality.

Reproductive:

-- Uterus: Measures 11.3 x 6.3 x 6.7 cm (volume = 250 cm^3). A
cm subserosal fibroid is seen in the uterine fundus. An 8 mm
intramural fibroid is seen in the right lateral corpus. A subserosal
fibroid is seen in the posterior corpus which has a small
intracavitary component. This fibroid measures 3.0 x 1.7 by 1.9 cm.
Prior C-section scar also noted.

-- Intracavitary fibroids: 3 cm posterior subserosal fibroid
described above has a small intracavitary component

-- Pedunculated fibroids: None.

-- Fibroid contrast enhancement: All fibroids show contrast
enhancement, without significant degeneration/devascularization.

-- Right ovary: A simple appearing cyst is seen in the right adnexa
measuring 4.7 x 4.4 cm.

-- Left ovary:  Appears normal.  No mass identified.

Other: No abnormal free fluid.

Musculoskeletal:  Unremarkable.
IMPRESSION: Several small uterine fibroids, largest measuring 3 cm which has a
small intracavitary component.

No pedunculated fibroids.

4.7 cm benign-appearing simple cyst in right adnexa. No follow-up
imaging recommended. Note: This recommendation does not apply to
premenarchal patients and to those with increased risk (genetic,
family history, elevated tumor markers or other high-risk factors)
of ovarian cancer. Reference: JACR [DATE]):[id number]

## 2021-06-10 MED ORDER — GADOBUTROL 1 MMOL/ML IV SOLN
8.0000 mL | Freq: Once | INTRAVENOUS | Status: AC | PRN
  Administered 2021-06-10: 8 mL via INTRAVENOUS

## 2021-06-11 ENCOUNTER — Other Ambulatory Visit (HOSPITAL_BASED_OUTPATIENT_CLINIC_OR_DEPARTMENT_OTHER): Payer: Self-pay | Admitting: Nurse Practitioner

## 2021-06-11 DIAGNOSIS — E559 Vitamin D deficiency, unspecified: Secondary | ICD-10-CM

## 2021-06-11 HISTORY — DX: Vitamin D deficiency, unspecified: E55.9

## 2021-06-11 MED ORDER — VITAMIN D (ERGOCALCIFEROL) 50000 UNITS PO CAPS: 1.0000 | ORAL_CAPSULE | ORAL | 0 refills | Status: DC

## 2021-06-12 ENCOUNTER — Other Ambulatory Visit: Payer: Self-pay | Admitting: Diagnostic Radiology

## 2021-06-12 DIAGNOSIS — D259 Leiomyoma of uterus, unspecified: Secondary | ICD-10-CM

## 2021-06-12 NOTE — Telephone Encounter (Signed)
Patient is aware referral placed to Dr Sammuel Hines office Per Laretta Bolster

## 2021-06-13 ENCOUNTER — Telehealth (HOSPITAL_BASED_OUTPATIENT_CLINIC_OR_DEPARTMENT_OTHER): Payer: Self-pay | Admitting: Nurse Practitioner

## 2021-06-13 NOTE — Telephone Encounter (Signed)
FMLA paper received will review and call patient when ready.

## 2021-06-13 NOTE — Telephone Encounter (Signed)
Received a fax from [facility]. Paperwork is left in providers Red dot tray. Please advise.

## 2021-06-17 ENCOUNTER — Encounter: Payer: Self-pay | Admitting: Orthopedic Surgery

## 2021-06-17 ENCOUNTER — Other Ambulatory Visit: Payer: Self-pay

## 2021-06-17 ENCOUNTER — Ambulatory Visit: Payer: 59 | Admitting: Orthopedic Surgery

## 2021-06-17 ENCOUNTER — Ambulatory Visit (INDEPENDENT_AMBULATORY_CARE_PROVIDER_SITE_OTHER): Payer: 59

## 2021-06-17 ENCOUNTER — Encounter (HOSPITAL_BASED_OUTPATIENT_CLINIC_OR_DEPARTMENT_OTHER): Payer: Self-pay | Admitting: Nurse Practitioner

## 2021-06-17 ENCOUNTER — Encounter (HOSPITAL_BASED_OUTPATIENT_CLINIC_OR_DEPARTMENT_OTHER): Payer: Self-pay

## 2021-06-17 VITALS — BP 134/87 | HR 61 | Ht 66.5 in | Wt 189.4 lb

## 2021-06-17 DIAGNOSIS — M79641 Pain in right hand: Secondary | ICD-10-CM | POA: Diagnosis not present

## 2021-06-17 DIAGNOSIS — S62354A Nondisplaced fracture of shaft of fourth metacarpal bone, right hand, initial encounter for closed fracture: Secondary | ICD-10-CM

## 2021-06-17 DIAGNOSIS — M5432 Sciatica, left side: Secondary | ICD-10-CM | POA: Diagnosis not present

## 2021-06-17 DIAGNOSIS — M6281 Muscle weakness (generalized): Secondary | ICD-10-CM | POA: Diagnosis not present

## 2021-06-17 DIAGNOSIS — R293 Abnormal posture: Secondary | ICD-10-CM | POA: Diagnosis not present

## 2021-06-17 DIAGNOSIS — M2569 Stiffness of other specified joint, not elsewhere classified: Secondary | ICD-10-CM | POA: Diagnosis not present
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
## 2021-06-20 ENCOUNTER — Other Ambulatory Visit: Payer: Self-pay

## 2021-06-20 ENCOUNTER — Encounter: Payer: Self-pay | Admitting: *Deleted

## 2021-06-20 ENCOUNTER — Ambulatory Visit
Admission: RE | Admit: 2021-06-20 | Discharge: 2021-06-20 | Disposition: A | Discharge status: Home / Self Care | Payer: 59 | Source: Ambulatory Visit | Attending: Diagnostic Radiology | Admitting: Diagnostic Radiology

## 2021-06-20 DIAGNOSIS — D259 Leiomyoma of uterus, unspecified: Secondary | ICD-10-CM

## 2021-06-20 DIAGNOSIS — N920 Excessive and frequent menstruation with regular cycle: Secondary | ICD-10-CM | POA: Diagnosis not present

## 2021-06-20 HISTORY — PX: IR RADIOLOGIST EVAL & MGMT: IMG5224

## 2021-06-20 NOTE — Progress Notes (Signed)
Chief Complaint: Patient was seen in consultation today for follow-up of a pelvic MRI  Referring Physician(s): Hale Bogus  History of Present Illness: Caitlyn Richardson is a 41 y.o. female with menorrhagia, uterine fibroids and iron deficiency.  Patient has a history of hysteroscopy with myomectomy and D&C in 2021.  I recently saw the patient on 06/07/2021 and we discussed uterine artery embolization for treatment of the menorrhagia.  In the interim, patient had a pelvic MRI and the patient presents for review of the MRI and treatment options.  Patient had menstrual bleeding around the time of her MRI and she says that the bleeding was typical for her with approximately 2 days of heavy bleeding.  She is not complaining of abdominal pain at this time.  No urinary symptoms.  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 ICD10 Conversion   Excessive and frequent menstruation with irregular cycle 10/09/2020   Female genital symptoms 07/23/2006   Formatting of this note might be different from the original. ICD10 Conversion   Iron deficiency anemia due to chronic blood loss 10/09/2020   Lumbosacral spondylosis without myelopathy 10/09/2020   Mixed hyperlipidemia 10/09/2020   Other chest pain 10/09/2020   Sciatica 10/09/2020    Past Surgical History:  Procedure Laterality Date   CESAREAN SECTION     IR RADIOLOGIST EVAL & MGMT  06/07/2021   MYOMECTOMY  2021    Allergies: Patient has no known allergies.  Medications: Prior to Admission medications   Medication Sig Start Date End Date Taking? Authorizing Provider  amLODipine (NORVASC) 5 MG tablet Take 1 tablet  by mouth  daily. 10/09/20   Orma Render, NP  cyclobenzaprine (FLEXERIL) 10 MG tablet Take 1 tablet (10 mg total) by mouth every 8 (eight) hours as needed for muscle spasms. 05/21/21   Orma Render, NP  escitalopram (LEXAPRO) 10 MG tablet Take 1 tablet  by mouth at bedtime. 02/05/21   Orma Render, NP  ferrous sulfate 325 (65 FE) MG tablet Take 325 mg by mouth daily with breakfast.    [provider]  gabapentin (NEURONTIN) 100 MG capsule Take 1 to 3 (100mg  to 300mg ) capsules as needed (up to 3 times a day) for neuralgia. 05/21/21   Orma Render, NP  HYDROcodone-acetaminophen (NORCO/VICODIN) 5-325 MG tablet Take 1 tablet by mouth every 6 (six) hours as needed. Patient not taking: Reported on 06/17/2021 05/27/21   [provider]  hydrOXYzine (VISTARIL) 25 MG capsule Take 1 capsule by mouth at bedtime and may repeat dose one time if needed. 02/05/21   Orma Render, NP  ibuprofen (ADVIL) 600 MG tablet Take 600 mg by mouth every 6 (six) hours as needed. 05/26/21   [provider]  loteprednol (LOTEMAX) 0.5 % ophthalmic suspension 1 drop 2 (two) times daily. 09/11/20   [provider]  meloxicam (MOBIC) 15 MG tablet Take 1 tablet (15 mg total) by mouth daily. 05/30/21   Orma Render, NP  norethindrone (AYGESTIN) 5 MG tablet Take 1 tablet (5 mg total) by mouth daily. 05/27/21   Megan Salon, MD  olmesartan-hydrochlorothiazide (BENICAR HCT) 40-25 MG tablet Take 1 tablet by  mouth daily. 10/09/20   Orma Render, NP  Vitamin D, Ergocalciferol, 50000 units CAPS Take 1 capsule by mouth once a week. Take for 12 weeks- then recheck Vitamin D2 levels 06/11/21   Early, Coralee Pesa, NP     Family History  Problem Relation Age of Onset   Hypertension Maternal Grandfather    Heart attack Father    Hypertension Mother    Thyroid disease Mother    Hypertension Brother     Social History   Socioeconomic History   Marital status: Single    Spouse name: Not on file   Number of children: 2    Years of education: Not on file   Highest education level: Not on file  Occupational History   Not on file  Tobacco Use   Smoking status: Never   Smokeless tobacco: Never  Vaping Use   Vaping Use: Never used  Substance and Sexual Activity   Alcohol use: Yes    Alcohol/week: 3.0 standard drinks    Types: 3 Cans of beer per week   Drug use: Not Currently   Sexual activity: Yes    Partners: Male    Birth control/protection: Surgical    Comment: BTL  Other Topics Concern   Not on file  Social History Narrative   Not on file   Social Determinants of Health   Financial Resource Strain: Not on file  Food Insecurity: Not on file  Transportation Needs: Not on file  Physical Activity: Not on file  Stress: Not on file  Social Connections: Not on file     Review of Systems  Genitourinary:  Positive for menstrual problem.       No urinary symptoms.   Vital Signs: There were no vitals taken for this visit.  Physical Exam Constitutional:      Appearance: Normal appearance. She is not ill-appearing.  Neurological:     Mental Status: She is alert.       Imaging: MR PELVIS W WO CONTRAST  Result Date: 06/11/2021 CLINICAL DATA:  Symptomatic fibroids.  Treatment planning. EXAM: MRI PELVIS WITHOUT AND WITH CONTRAST TECHNIQUE: Multiplanar multisequence MR imaging of the pelvis was performed both before and after administration of intravenous contrast. CONTRAST:  90mL GADAVIST GADOBUTROL 1 MMOL/ML IV SOLN COMPARISON:  CT on 07/12/2020 FINDINGS: Lower Urinary Tract: No bladder or urethral abnormality identified. Bowel:  Unremarkable visualized pelvic bowel loops. Vascular/Lymphatic: No pathologically enlarged lymph nodes or other significant abnormality. Reproductive: -- Uterus: Measures 11.3 x 6.3 x 6.7 cm (volume = 250 cm^3). A 1.1 cm subserosal fibroid is seen in the uterine fundus. An 8 mm intramural fibroid is seen in the right lateral corpus. A subserosal fibroid is seen in the  posterior corpus which has a small intracavitary component. This fibroid measures 3.0 x 1.7 by 1.9 cm. Prior C-section scar also noted. -- Intracavitary fibroids: 3 cm posterior subserosal fibroid described above has a small intracavitary component -- Pedunculated fibroids: None. -- Fibroid contrast enhancement: All fibroids show contrast enhancement, without significant degeneration/devascularization. -- Right ovary: A simple appearing cyst is seen in the right adnexa measuring 4.7 x 4.4 cm. -- Left ovary:  Appears normal.  No mass identified. Other: No abnormal free fluid. Musculoskeletal:  Unremarkable. IMPRESSION: Several small uterine fibroids, largest measuring 3 cm which has a small intracavitary component. No pedunculated fibroids. 4.7 cm benign-appearing simple cyst in right adnexa. No follow-up imaging recommended. Note: This recommendation does not apply to premenarchal patients and to those with increased  risk (genetic, family history, elevated tumor markers or other high-risk factors) of ovarian cancer. Reference: JACR 2020 Feb; 17(2):248-254 Electronically Signed   By: Marlaine Hind M.D.   On: 06/11/2021 15:55   XR Hand Complete Right  Result Date: 06/17/2021 Multiple views of the right hand taken today reviewed interpreted by me.  They demonstrate a nondisplaced fracture of the fourth metacarpal shaft.  There is also a questionable collateral avulsion injury to the ring finger PIP joint.  IR Radiologist Eval & Mgmt  Result Date: 06/07/2021 Please refer to notes tab for details about interventional procedure. (Op Note)   Labs:  CBC: Recent Labs    12/18/20 1311 02/14/21 1637 03/12/21 1438 05/21/21 1702  WBC 7.5 8.8 8.4 7.9  HGB 11.5* 12.3 11.7* 11.9  HCT 36.2 37.3 35.4* 35.5  PLT 321 338 330 326    COAGS: No results for input(s): INR, APTT in the last 8760 hours.  BMP: Recent Labs    10/09/20 0913  NA 144  K 3.7  CL 104  CO2 21  GLUCOSE 117*  BUN 11  CALCIUM 9.7   CREATININE 0.75    LIVER FUNCTION TESTS: Recent Labs    10/09/20 0913  BILITOT 0.5  AST 10  ALT 11  ALKPHOS 62  PROT 7.5  ALBUMIN 4.5    TUMOR MARKERS: No results for input(s): AFPTM, CEA, CA199, CHROMGRNA in the last 8760 hours.  Assessment and Plan:  41 year old with uterine fibroids and menorrhagia.  Patient had pelvic MRI on 06/10/2021.  I personally reviewed the pelvic MRI.  There are only 3 distinct fibroids that I can did identify on the MRI.  Two of the fibroids are very small with the largest measuring 1.1 cm.  There is a dominant fibroid that has an intracavitary component and the fibroid measures up to 3.0 cm.  This fibroid was described as a subserosal fibroid on the MRI report but the fibroid appears to have intramural and submucosal components.  This submucosal or intracavitary component measures approximately 1.2 x 0.8 cm based on the sagittal images.  I suspect this submucosal fibroid is causing most of the patient's symptoms.  Based on the small fibroid burden in the uterus, I would like to refer the patient back to gynecology to consider directed therapy of the submucosal fibroid prior to considering uterine artery embolization.  Explained to patient that uterine artery embolization treats the entire uterus rather than targeting 1 specific area.  In addition, I explained to the patient that when there is a submucosal fibroid with a prominent intracavitary component these fibroids can have prolonged shedding and vaginal discharge after an embolization procedure and sometimes it is prudent to have this area removed prior to an embolization procedure.  Therefore, we will refer the patient back to gynecology to discuss options of this dominant fibroid with an intracavitary component.  In addition, the patient has a prominent simple right adnexal cyst which we reviewed on the MRI.  Patient is not having any symptoms from this large adnexal cyst.   Thank you for this interesting  consult.  I greatly enjoyed meeting Riyan Gavina and look forward to participating in their care.  A copy of this report was sent to the requesting provider on this date.  Electronically Signed: Burman Riis 06/20/2021, 12:19 PM   I spent a total of    10 Minutes in face to face in clinical consultation, greater than 50% of which was counseling/coordinating care for menorrhagia and uterine fibroids.  Patient ID: Caitlyn Richardson, female   DOB: [birth year], 41 y.o.   MRN: [id number]

## 2021-06-24 DIAGNOSIS — R0789 Other chest pain: Secondary | ICD-10-CM | POA: Diagnosis not present

## 2021-06-24 DIAGNOSIS — M79605 Pain in left leg: Secondary | ICD-10-CM | POA: Diagnosis not present

## 2021-06-24 DIAGNOSIS — S62304A Unspecified fracture of fourth metacarpal bone, right hand, initial encounter for closed fracture: Secondary | ICD-10-CM | POA: Diagnosis not present

## 2021-06-26 ENCOUNTER — Encounter (HOSPITAL_BASED_OUTPATIENT_CLINIC_OR_DEPARTMENT_OTHER): Payer: Self-pay

## 2021-06-26 ENCOUNTER — Emergency Department (HOSPITAL_BASED_OUTPATIENT_CLINIC_OR_DEPARTMENT_OTHER)
Admission: EM | Admit: 2021-06-26 | Discharge: 2021-06-26 | Disposition: A | Discharge status: Home / Self Care | Payer: 59 | Attending: Emergency Medicine | Admitting: Emergency Medicine

## 2021-06-26 ENCOUNTER — Other Ambulatory Visit: Payer: Self-pay

## 2021-06-26 ENCOUNTER — Emergency Department (HOSPITAL_BASED_OUTPATIENT_CLINIC_OR_DEPARTMENT_OTHER): Payer: 59

## 2021-06-26 DIAGNOSIS — M7989 Other specified soft tissue disorders: Secondary | ICD-10-CM | POA: Diagnosis not present

## 2021-06-26 DIAGNOSIS — R600 Localized edema: Secondary | ICD-10-CM | POA: Diagnosis not present

## 2021-06-26 DIAGNOSIS — M79662 Pain in left lower leg: Secondary | ICD-10-CM

## 2021-06-26 DIAGNOSIS — X58XXXA Exposure to other specified factors, initial encounter: Secondary | ICD-10-CM | POA: Insufficient documentation

## 2021-06-26 DIAGNOSIS — S8992XA Unspecified injury of left lower leg, initial encounter: Secondary | ICD-10-CM | POA: Diagnosis present

## 2021-06-26 DIAGNOSIS — S8012XA Contusion of left lower leg, initial encounter: Secondary | ICD-10-CM | POA: Insufficient documentation

## 2021-06-26 DIAGNOSIS — Z79899 Other long term (current) drug therapy: Secondary | ICD-10-CM | POA: Insufficient documentation

## 2021-06-26 DIAGNOSIS — E876 Hypokalemia: Secondary | ICD-10-CM | POA: Insufficient documentation

## 2021-06-26 LAB — CBC WITH DIFFERENTIAL/PLATELET
Abs Immature Granulocytes: 0.03 10*3/uL (ref 0.00–0.07)
Basophils Absolute: 0 10*3/uL (ref 0.0–0.1)
Basophils Relative: 1 %
Eosinophils Absolute: 0.1 10*3/uL (ref 0.0–0.5)
Eosinophils Relative: 1 %
HCT: 33.8 % — ABNORMAL LOW (ref 36.0–46.0)
Hemoglobin: 11.2 g/dL — ABNORMAL LOW (ref 12.0–15.0)
Immature Granulocytes: 0 %
Lymphocytes Relative: 24 %
Lymphs Abs: 1.9 10*3/uL (ref 0.7–4.0)
MCH: 28.6 pg (ref 26.0–34.0)
MCHC: 33.1 g/dL (ref 30.0–36.0)
MCV: 86.4 fL (ref 80.0–100.0)
Monocytes Absolute: 0.7 10*3/uL (ref 0.1–1.0)
Monocytes Relative: 8 %
Neutro Abs: 5.2 10*3/uL (ref 1.7–7.7)
Neutrophils Relative %: 66 %
Platelets: 300 10*3/uL (ref 150–400)
RBC: 3.91 MIL/uL (ref 3.87–5.11)
RDW: 12.9 % (ref 11.5–15.5)
WBC: 7.9 10*3/uL (ref 4.0–10.5)
nRBC: 0 % (ref 0.0–0.2)

## 2021-06-26 LAB — BASIC METABOLIC PANEL
Anion gap: 10 (ref 5–15)
BUN: 13 mg/dL (ref 6–20)
CO2: 28 mmol/L (ref 22–32)
Calcium: 9.7 mg/dL (ref 8.9–10.3)
Chloride: 102 mmol/L (ref 98–111)
Creatinine, Ser: 0.7 mg/dL (ref 0.44–1.00)
GFR, Estimated: >60 mL/min (ref >60)
Glucose, Bld: 84 mg/dL (ref 70–99)
Potassium: 3 mmol/L — ABNORMAL LOW (ref 3.5–5.1)
Sodium: 140 mmol/L (ref 135–145)

## 2021-06-26 IMAGING — US US EXTREM LOW VENOUS*L*
1 series · 13 of 24 positions shown · non-contrast
Comparison: None.

CLINICAL DATA: Left leg edema and pain.

EXAM:
Left LOWER EXTREMITY VENOUS DOPPLER ULTRASOUND
TECHNIQUE: Gray-scale sonography with compression, as well as color and duplex
ultrasound, were performed to evaluate the deep venous system(s)
from the level of the common femoral vein through the popliteal and
proximal calf veins.

[Series 1: us venous img lower uni left (dvt) · portal-venous · 13 of 32 slices shown]
[im 1/32]
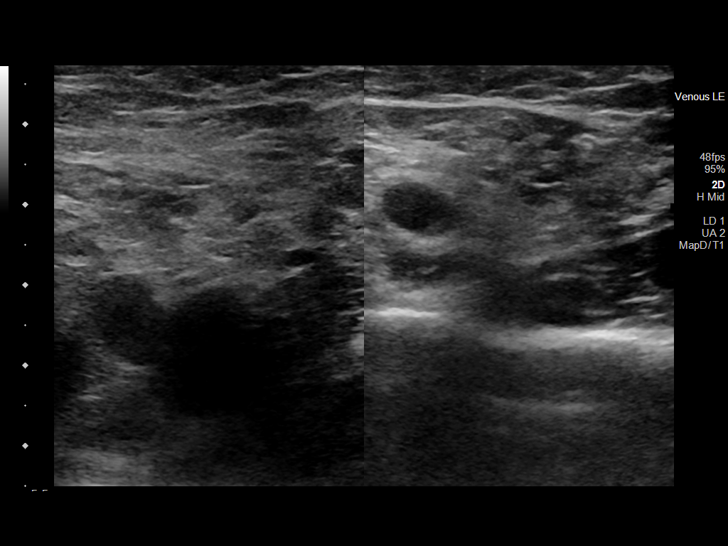
[im 3/32]
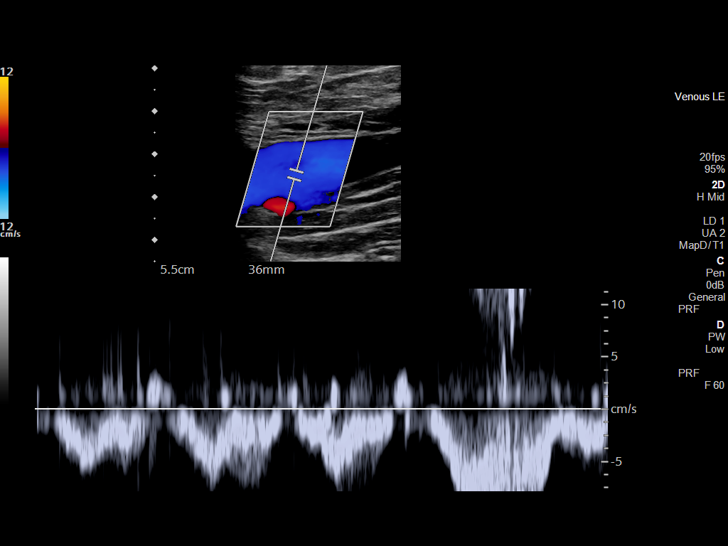
[im 6/32]
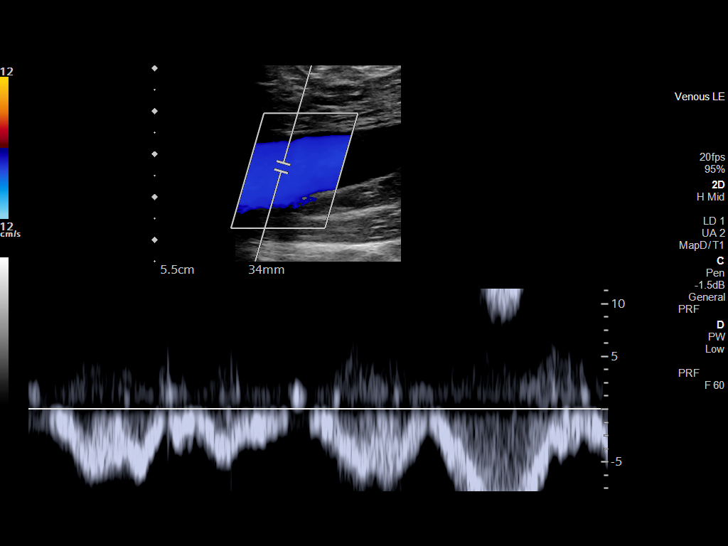
[im 9/32]
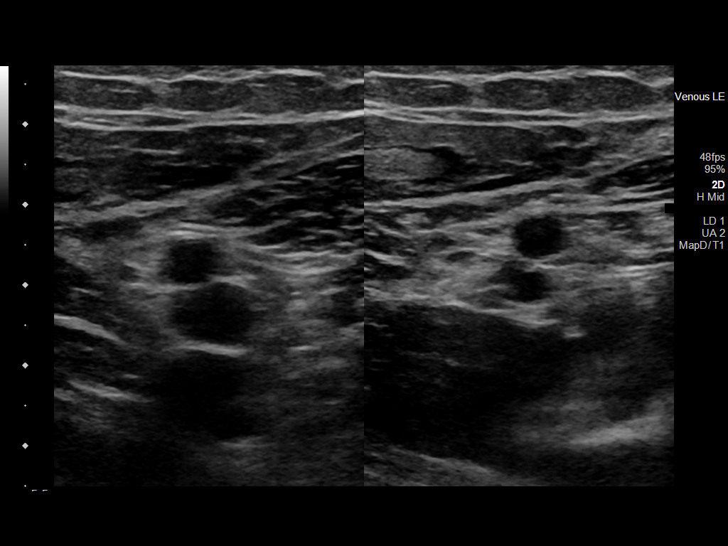
[im 11/32]
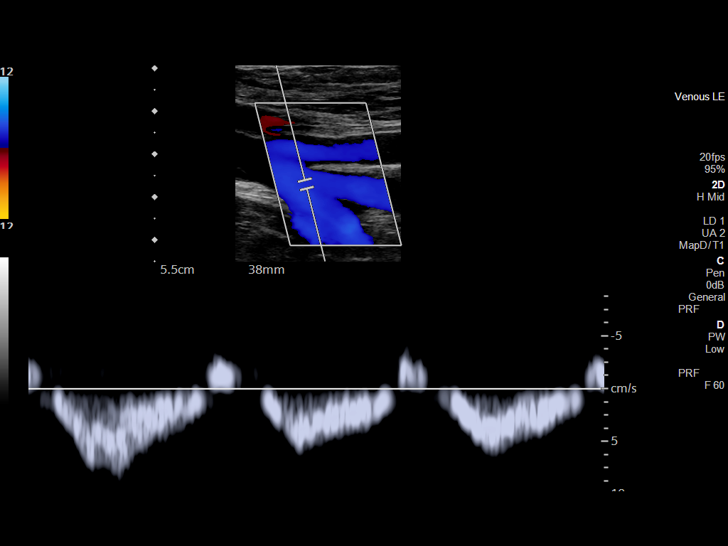
[im 14/32]
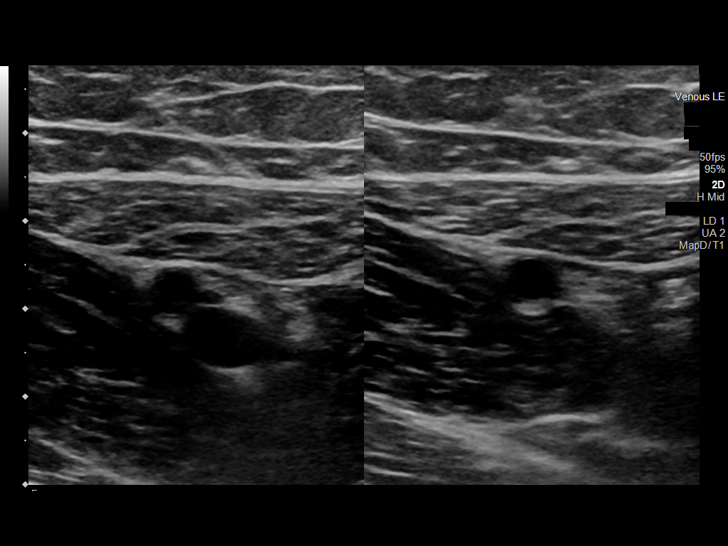
[im 17/32]
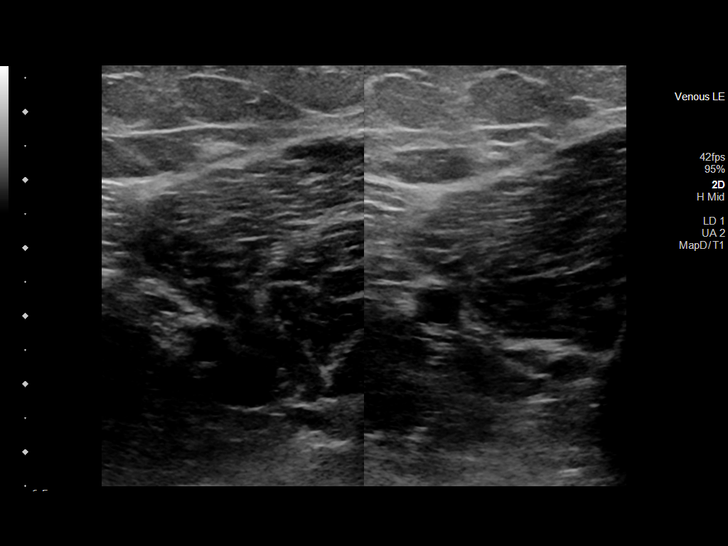
[im 18/32]
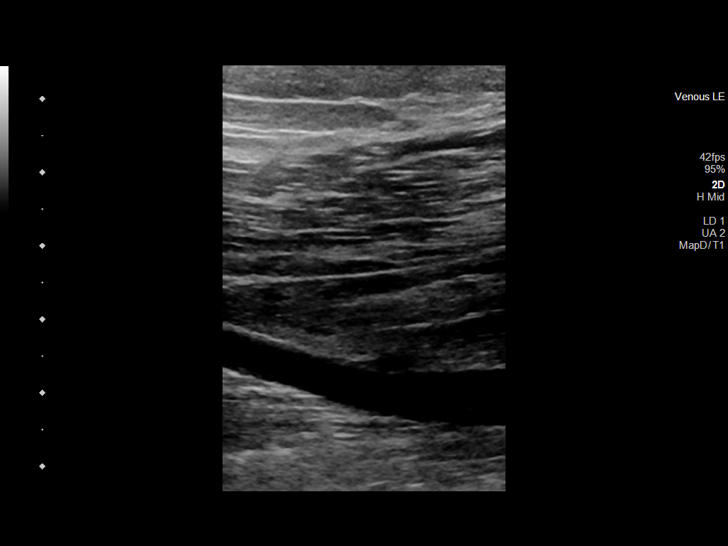
[im 21/32]
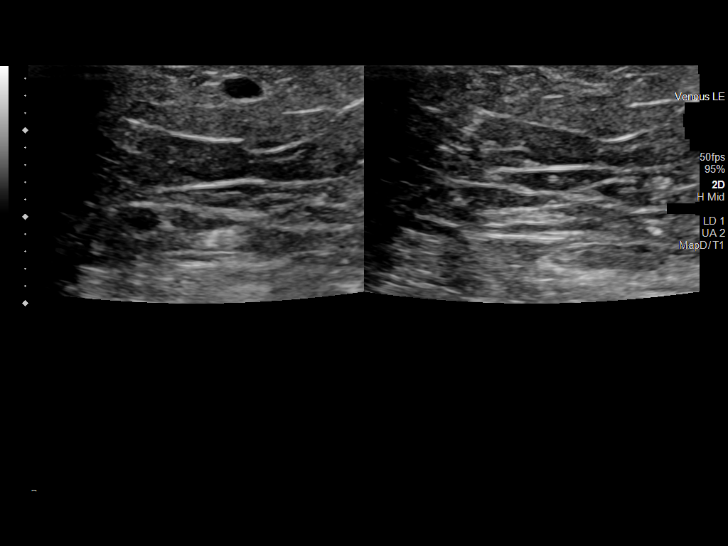
[im 23/32]
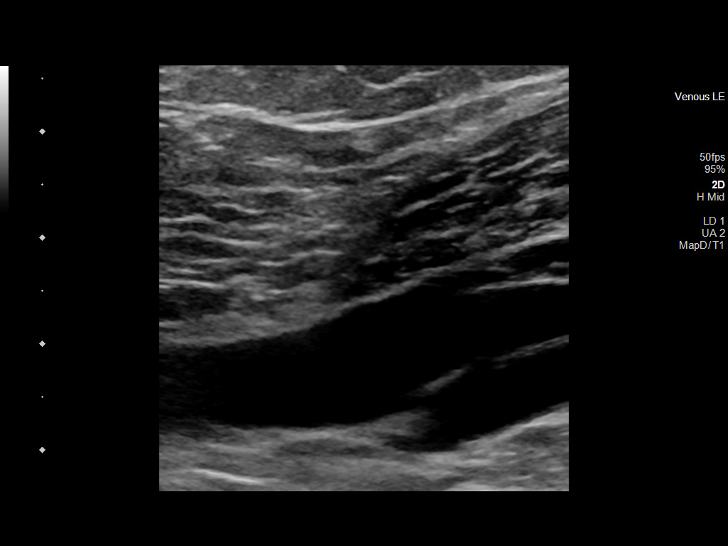
[im 26/32]
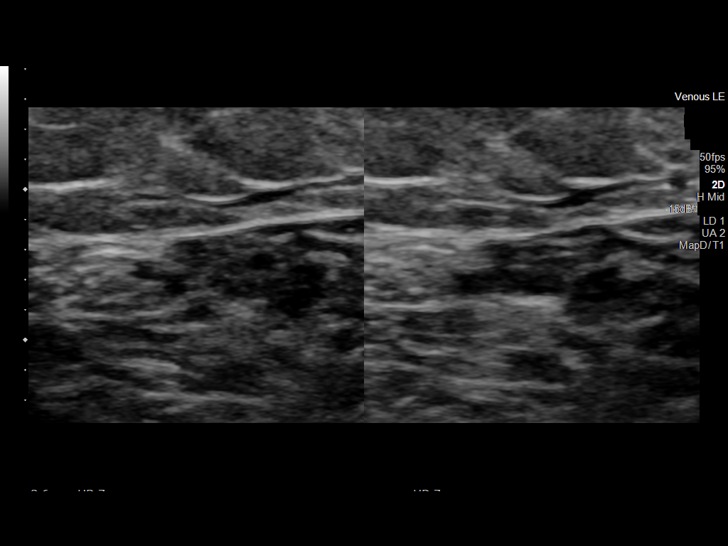
[im 29/32]
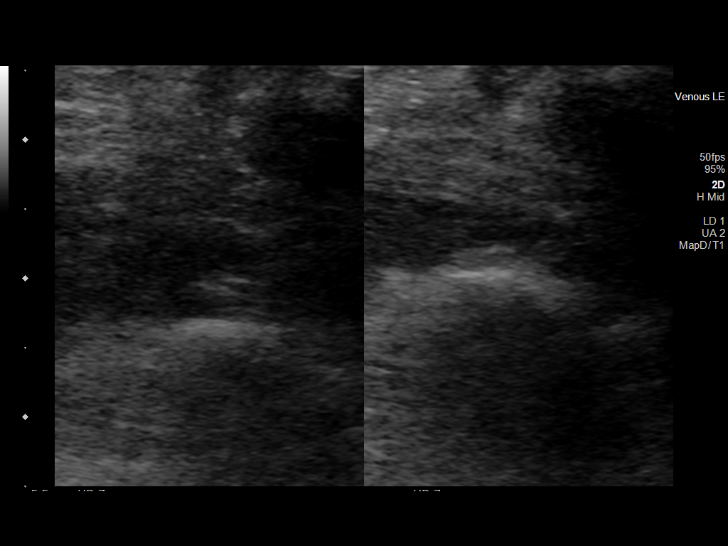
[im 32/32]
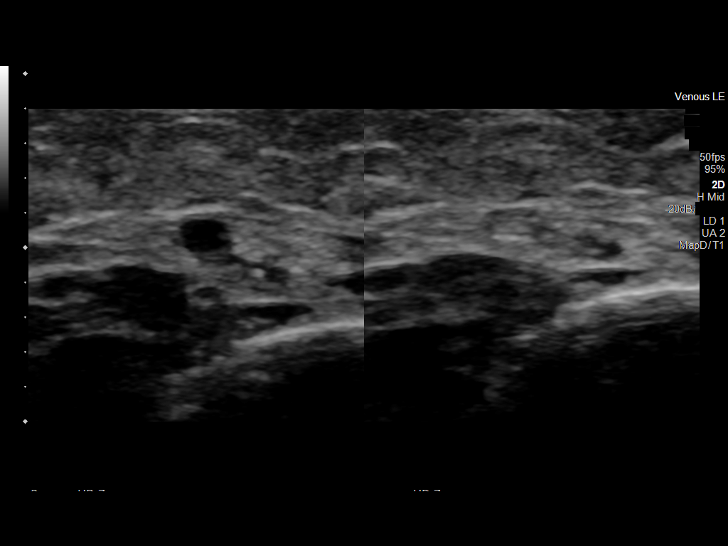

[13 of 24 positions shown; findings below may reference images not displayed]

FINDINGS: VENOUS

Incomplete compressibility of the left deep femoral vein with no
grayscale filling defect or abnormal color images.

Otherwise normal compressibility of the common femoral, superficial
femoral, and popliteal veins, as well as the visualized calf veins.
Visualized portions of the great saphenous vein unremarkable. No
filling defects to suggest DVT on grayscale or color Doppler
imaging. Doppler waveforms show normal direction of venous flow,
normal respiratory plasticity and response to augmentation.

Limited views of the contralateral common femoral vein are
unremarkable.

OTHER

None.

Limitations: none
IMPRESSION: No definite deep venous thrombosis of left lower extremity with
limited evaluation of the left deep femoral vein. Cannot fully
exclude a deep venous thrombosis of the left deep femoral vein.

## 2021-06-26 MED ORDER — GABAPENTIN 300 MG PO CAPS: 300.0000 mg | ORAL_CAPSULE | Freq: Three times a day (TID) | ORAL | 0 refills | Status: DC

## 2021-06-26 MED ORDER — POTASSIUM CHLORIDE CRYS ER 20 MEQ PO TBCR: 40.0000 meq | EXTENDED_RELEASE_TABLET | Freq: Two times a day (BID) | ORAL | 0 refills | Status: DC

## 2021-06-26 NOTE — ED Notes (Signed)
ED Provider at bedside. 

## 2021-06-26 NOTE — Discharge Instructions (Addendum)
Take potassium supplements twice daily for the next week.  Gabapentin refill sent.  Need to follow-up in 1 week for repeat ultrasound.  You can do that with your primary or return back here if unable to schedule outpatient.
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
## 2021-06-28 ENCOUNTER — Encounter (HOSPITAL_BASED_OUTPATIENT_CLINIC_OR_DEPARTMENT_OTHER): Payer: Self-pay | Admitting: Nurse Practitioner

## 2021-06-30 ENCOUNTER — Encounter (HOSPITAL_BASED_OUTPATIENT_CLINIC_OR_DEPARTMENT_OTHER): Payer: Self-pay | Admitting: Obstetrics & Gynecology

## 2021-06-30 ENCOUNTER — Other Ambulatory Visit (HOSPITAL_BASED_OUTPATIENT_CLINIC_OR_DEPARTMENT_OTHER): Payer: Self-pay | Admitting: Nurse Practitioner

## 2021-06-30 DIAGNOSIS — S62304A Unspecified fracture of fourth metacarpal bone, right hand, initial encounter for closed fracture: Secondary | ICD-10-CM

## 2021-07-01 ENCOUNTER — Other Ambulatory Visit (HOSPITAL_COMMUNITY): Payer: Self-pay

## 2021-07-01 MED ORDER — MELOXICAM 15 MG PO TABS
15.0000 mg | ORAL_TABLET | Freq: Every day | ORAL | 0 refills | Status: DC
  Filled 2021-07-01: qty 30, 30d supply, fill #0

## 2021-07-03 DIAGNOSIS — M5432 Sciatica, left side: Secondary | ICD-10-CM | POA: Diagnosis not present

## 2021-07-03 DIAGNOSIS — M6281 Muscle weakness (generalized): Secondary | ICD-10-CM | POA: Diagnosis not present

## 2021-07-03 DIAGNOSIS — R293 Abnormal posture: Secondary | ICD-10-CM | POA: Diagnosis not present

## 2021-07-03 DIAGNOSIS — M2569 Stiffness of other specified joint, not elsewhere classified: Secondary | ICD-10-CM | POA: Diagnosis not present

## 2021-07-08 ENCOUNTER — Ambulatory Visit: Payer: 59 | Admitting: Orthopedic Surgery

## 2021-07-09 ENCOUNTER — Telehealth (HOSPITAL_BASED_OUTPATIENT_CLINIC_OR_DEPARTMENT_OTHER): Payer: Self-pay | Admitting: Obstetrics & Gynecology

## 2021-07-09 ENCOUNTER — Inpatient Hospital Stay (HOSPITAL_BASED_OUTPATIENT_CLINIC_OR_DEPARTMENT_OTHER): Payer: 59 | Admitting: Oncology

## 2021-07-09 ENCOUNTER — Other Ambulatory Visit: Payer: Self-pay

## 2021-07-09 ENCOUNTER — Inpatient Hospital Stay: Payer: 59 | Attending: Oncology

## 2021-07-09 VITALS — BP 132/75 | HR 73 | Temp 98.2°F | Resp 18 | Ht 66.0 in | Wt 188.8 lb

## 2021-07-09 DIAGNOSIS — K5900 Constipation, unspecified: Secondary | ICD-10-CM | POA: Diagnosis not present

## 2021-07-09 DIAGNOSIS — M25472 Effusion, left ankle: Secondary | ICD-10-CM | POA: Insufficient documentation

## 2021-07-09 DIAGNOSIS — D509 Iron deficiency anemia, unspecified: Secondary | ICD-10-CM | POA: Diagnosis not present

## 2021-07-09 DIAGNOSIS — I10 Essential (primary) hypertension: Secondary | ICD-10-CM | POA: Insufficient documentation

## 2021-07-09 DIAGNOSIS — Z79899 Other long term (current) drug therapy: Secondary | ICD-10-CM | POA: Diagnosis not present

## 2021-07-09 DIAGNOSIS — Z86018 Personal history of other benign neoplasm: Secondary | ICD-10-CM | POA: Diagnosis not present

## 2021-07-09 DIAGNOSIS — N920 Excessive and frequent menstruation with regular cycle: Secondary | ICD-10-CM | POA: Diagnosis not present

## 2021-07-09 DIAGNOSIS — D500 Iron deficiency anemia secondary to blood loss (chronic): Secondary | ICD-10-CM | POA: Diagnosis not present

## 2021-07-09 LAB — CBC WITH DIFFERENTIAL (CANCER CENTER ONLY)
Abs Immature Granulocytes: 0.02 10*3/uL (ref 0.00–0.07)
Basophils Absolute: 0 10*3/uL (ref 0.0–0.1)
Basophils Relative: 1 %
Eosinophils Absolute: 0.1 10*3/uL (ref 0.0–0.5)
Eosinophils Relative: 1 %
HCT: 35.1 % — ABNORMAL LOW (ref 36.0–46.0)
Hemoglobin: 11.6 g/dL — ABNORMAL LOW (ref 12.0–15.0)
Immature Granulocytes: 0 %
Lymphocytes Relative: 21 %
Lymphs Abs: 1.4 10*3/uL (ref 0.7–4.0)
MCH: 28.8 pg (ref 26.0–34.0)
MCHC: 33 g/dL (ref 30.0–36.0)
MCV: 87.1 fL (ref 80.0–100.0)
Monocytes Absolute: 0.5 10*3/uL (ref 0.1–1.0)
Monocytes Relative: 7 %
Neutro Abs: 4.8 10*3/uL (ref 1.7–7.7)
Neutrophils Relative %: 70 %
Platelet Count: 309 10*3/uL (ref 150–400)
RBC: 4.03 MIL/uL (ref 3.87–5.11)
RDW: 12.9 % (ref 11.5–15.5)
WBC Count: 6.8 10*3/uL (ref 4.0–10.5)
nRBC: 0 % (ref 0.0–0.2)

## 2021-07-09 LAB — FERRITIN: Ferritin: 20 ng/mL (ref 11–307)

## 2021-07-09 NOTE — Telephone Encounter (Signed)
Patient came in the  office today and would like someone to please call her go over her results . ?
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
## 2021-07-10 ENCOUNTER — Telehealth: Payer: Self-pay

## 2021-07-10 ENCOUNTER — Encounter (HOSPITAL_BASED_OUTPATIENT_CLINIC_OR_DEPARTMENT_OTHER): Payer: Self-pay | Admitting: Nurse Practitioner

## 2021-07-10 DIAGNOSIS — M5432 Sciatica, left side: Secondary | ICD-10-CM | POA: Diagnosis not present

## 2021-07-10 DIAGNOSIS — R293 Abnormal posture: Secondary | ICD-10-CM | POA: Diagnosis not present

## 2021-07-10 DIAGNOSIS — M2569 Stiffness of other specified joint, not elsewhere classified: Secondary | ICD-10-CM | POA: Diagnosis not present

## 2021-07-10 DIAGNOSIS — M6281 Muscle weakness (generalized): Secondary | ICD-10-CM | POA: Diagnosis not present

## 2021-07-10 NOTE — Telephone Encounter (Signed)
Patient sent a MyChart message that I have forwarded to Dr. Sabra Heck. tbw ?

## 2021-07-10 NOTE — Telephone Encounter (Signed)
Called and spoke with the patient to let her know her iron is at low end of normal range. Try to increase iron to twice daily, use miralax as needed for constipation. Patient voice understanding of instruction and had no further questions or concerns at this time ?

## 2021-07-10 NOTE — Telephone Encounter (Signed)
-----   Message from Ladell Pier, MD sent at 07/09/2021  7:29 PM EST ----- ?Please call patient, iron is at low end of normal range, try to increase iron to twice daily, use miralax as needed for constipation ? ?

## 2021-07-15 DIAGNOSIS — M2569 Stiffness of other specified joint, not elsewhere classified: Secondary | ICD-10-CM | POA: Diagnosis not present

## 2021-07-15 DIAGNOSIS — M5432 Sciatica, left side: Secondary | ICD-10-CM | POA: Diagnosis not present

## 2021-07-15 DIAGNOSIS — M6281 Muscle weakness (generalized): Secondary | ICD-10-CM | POA: Diagnosis not present

## 2021-07-15 DIAGNOSIS — R293 Abnormal posture: Secondary | ICD-10-CM | POA: Diagnosis not present

## 2021-07-16 ENCOUNTER — Ambulatory Visit (HOSPITAL_BASED_OUTPATIENT_CLINIC_OR_DEPARTMENT_OTHER): Payer: 59 | Admitting: Nurse Practitioner

## 2021-07-16 ENCOUNTER — Telehealth (HOSPITAL_BASED_OUTPATIENT_CLINIC_OR_DEPARTMENT_OTHER): Payer: Self-pay

## 2021-07-16 ENCOUNTER — Other Ambulatory Visit: Payer: Self-pay

## 2021-07-16 ENCOUNTER — Encounter (HOSPITAL_BASED_OUTPATIENT_CLINIC_OR_DEPARTMENT_OTHER): Payer: Self-pay | Admitting: Nurse Practitioner

## 2021-07-16 ENCOUNTER — Ambulatory Visit
Admission: RE | Admit: 2021-07-16 | Discharge: 2021-07-16 | Disposition: A | Discharge status: Home / Self Care | Payer: 59 | Source: Ambulatory Visit | Attending: Nurse Practitioner | Admitting: Nurse Practitioner

## 2021-07-16 DIAGNOSIS — M545 Low back pain, unspecified: Secondary | ICD-10-CM | POA: Diagnosis not present

## 2021-07-16 DIAGNOSIS — M5432 Sciatica, left side: Secondary | ICD-10-CM | POA: Diagnosis not present

## 2021-07-16 DIAGNOSIS — R202 Paresthesia of skin: Secondary | ICD-10-CM

## 2021-07-16 DIAGNOSIS — S62354A Nondisplaced fracture of shaft of fourth metacarpal bone, right hand, initial encounter for closed fracture: Secondary | ICD-10-CM

## 2021-07-16 IMAGING — CR DG LUMBAR SPINE 2-3V
3 series · 3 of 3 positions shown · non-contrast
Comparison: X-ray [DATE].

CLINICAL DATA: Midline low back pain radiating into left buttock
and down back of left leg to foot.

EXAM:
LUMBAR SPINE - 2-3 VIEW

[w lumbar spine ap]
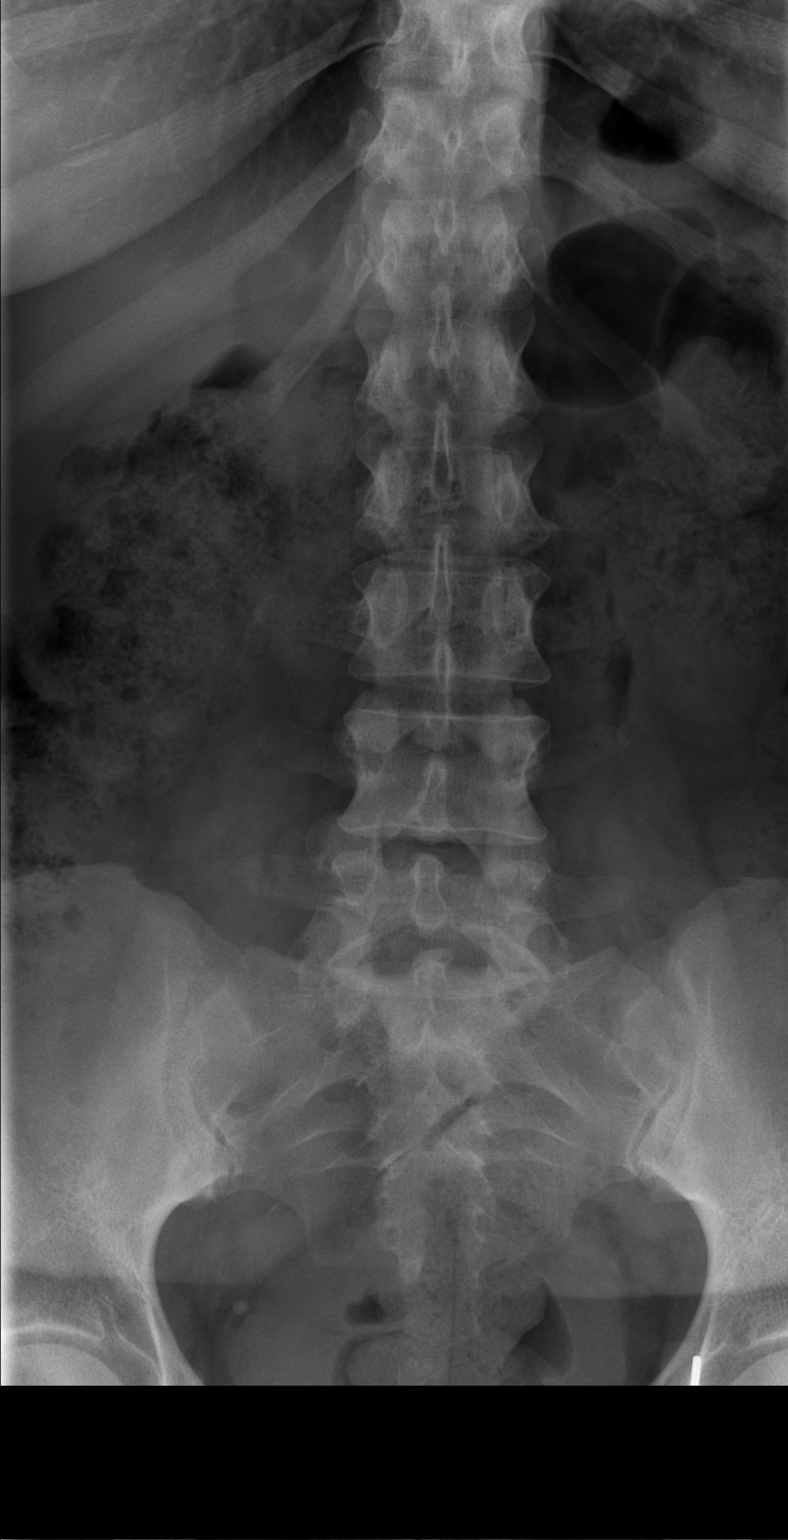

[w lumbar spine lat]
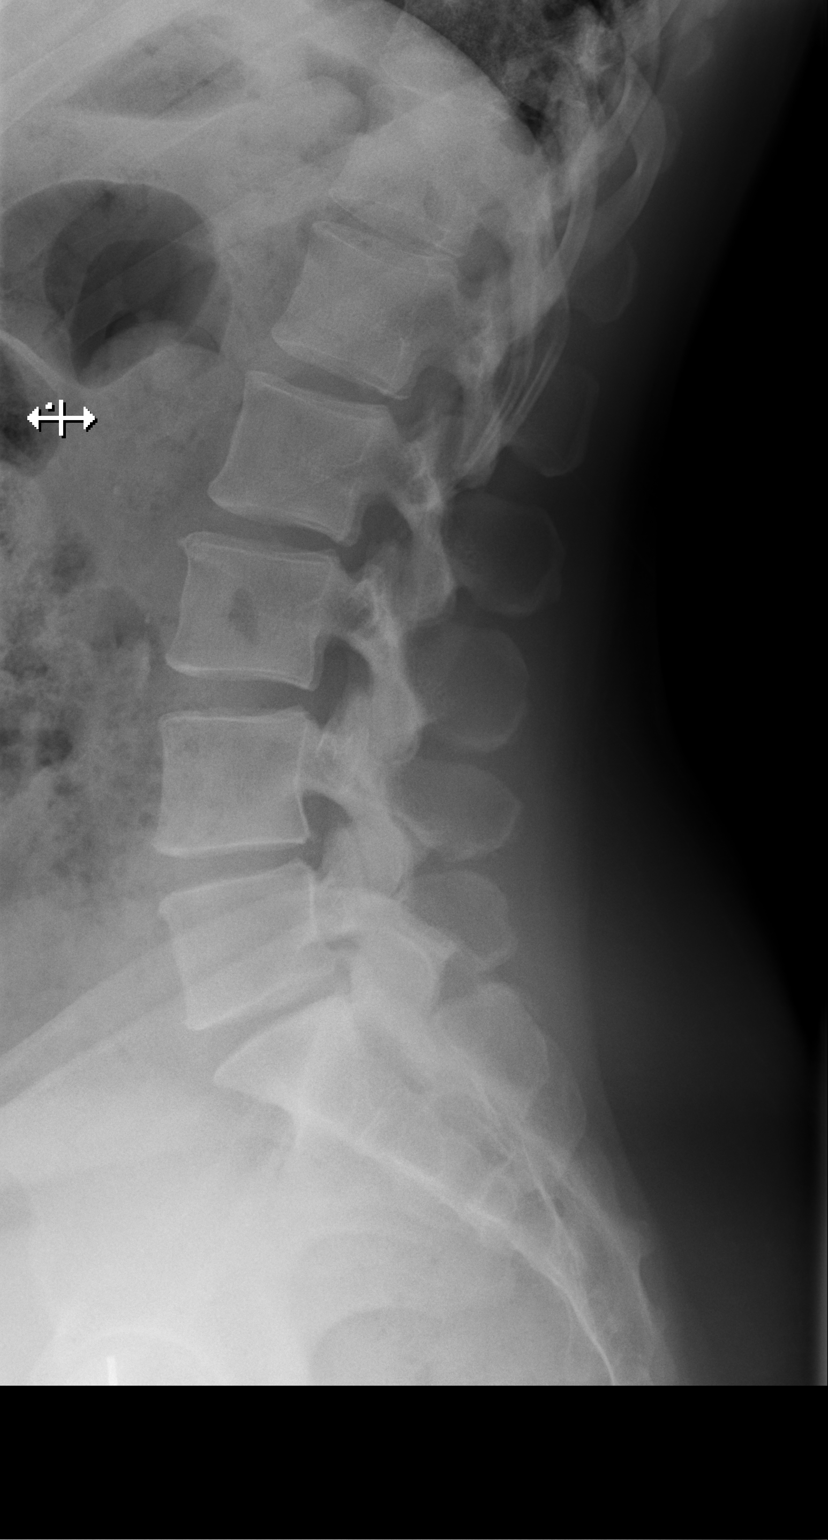

[w lumbar l-5 s-1 spot]
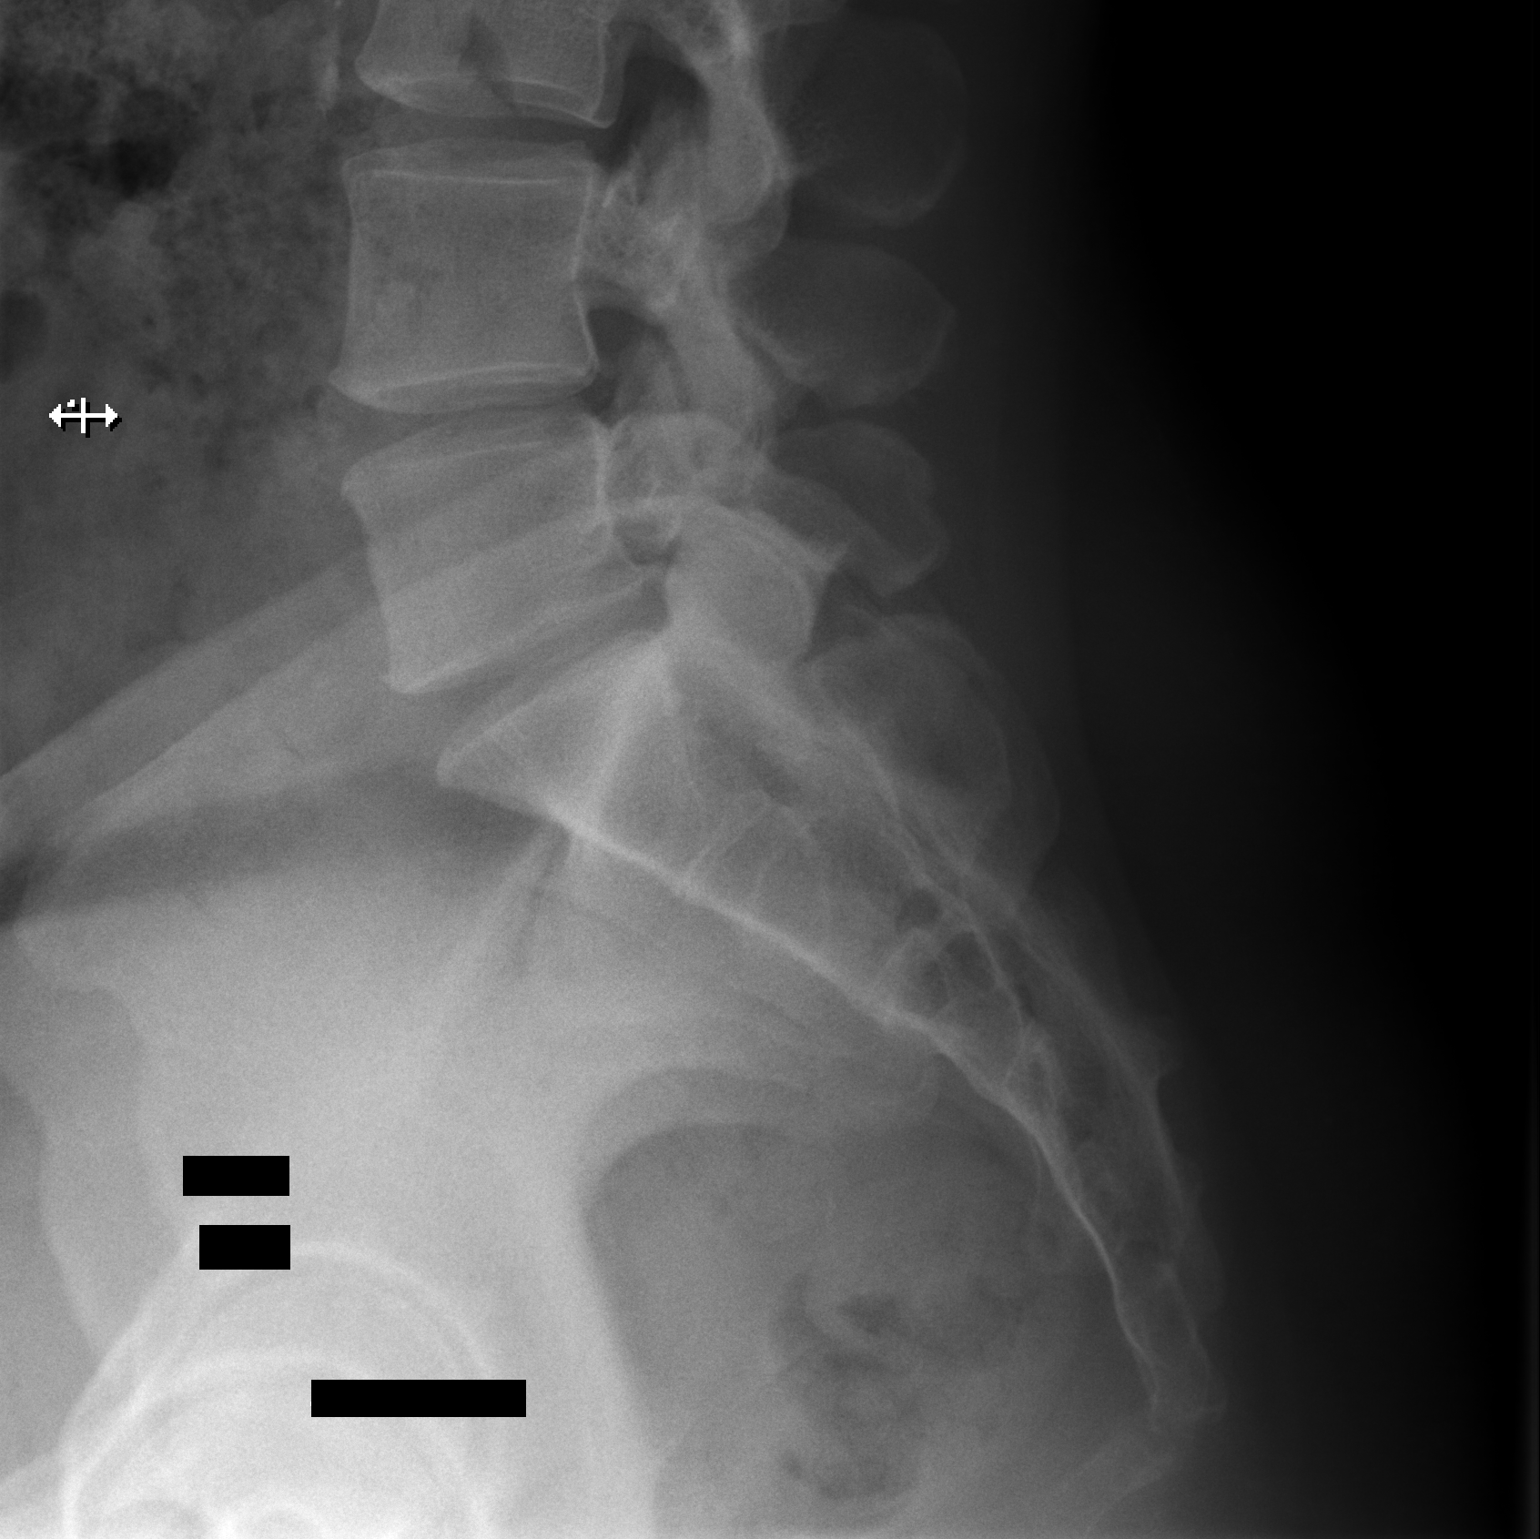

[3 of 3 positions shown; findings below may reference images not displayed]

FINDINGS: There is no evidence of lumbar spine fracture. Alignment is normal.
Intervertebral disc spaces are maintained.
IMPRESSION: Negative.

## 2021-07-16 MED ORDER — TRAMADOL HCL 50 MG PO TABS: 50.0000 mg | ORAL_TABLET | Freq: Four times a day (QID) | ORAL | 0 refills | Status: AC | PRN

## 2021-07-16 MED ORDER — PREGABALIN 50 MG PO CAPS: 50.0000 mg | ORAL_CAPSULE | Freq: Three times a day (TID) | ORAL | 3 refills | Status: DC

## 2021-07-16 NOTE — Patient Instructions (Addendum)
I have sent an order for a lumbar x-ray to see if there is anything occurring that is contributing to the worsening pain.  ?I have also sent a referral to the spine specialist so we can have them on board.  ? ?I am redoing the FMLA paperwork and we will get this resubmitted by tomorrow. I will add this visit to that.  ? ?Increase the gabapentin to 600-900 at bedtime only and continue with the 300 during the day to see if this helps at all.  ? ?[facility] ?Located inside [facility] ?Address: [address], [place], [place] [address] ?Hours: 8am -5:30pm Monday through Friday ?You can walk-in to have the x-ray ? ? ?
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
## 2021-07-16 NOTE — Assessment & Plan Note (Signed)
9/10 pain extending from the left buttock down to left foot.  No improvement with Flexeril, gabapentin, NSAIDs, Tylenol, TENS unit, physical therapy, gentle stretching, prednisone. ?We will send orders for lumbar x-ray and referral to spinal specialist for further evaluation. ?Recommend stop gabapentin and we will trial Lyrica to see if this provides any additional relief.  Will send short-term treatment with tramadol to see if this is helpful to allow her to at least sleep without pain. ?Will reevaluate FMLA paperwork and resend request along with office visit notes for both continuous and intermittent leave until June of this year. ?Patient is concerned about the amount of work that she has had to miss due to pain and limited ability to sit or stand for long periods of time. ?
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
## 2021-07-17 ENCOUNTER — Encounter (HOSPITAL_BASED_OUTPATIENT_CLINIC_OR_DEPARTMENT_OTHER): Payer: Self-pay | Admitting: Nurse Practitioner

## 2021-07-23 ENCOUNTER — Encounter (HOSPITAL_BASED_OUTPATIENT_CLINIC_OR_DEPARTMENT_OTHER): Payer: Self-pay | Admitting: Nurse Practitioner

## 2021-07-23 DIAGNOSIS — Z6830 Body mass index (BMI) 30.0-30.9, adult: Secondary | ICD-10-CM | POA: Diagnosis not present

## 2021-07-23 DIAGNOSIS — M5416 Radiculopathy, lumbar region: Secondary | ICD-10-CM | POA: Diagnosis not present

## 2021-07-25 ENCOUNTER — Telehealth (HOSPITAL_BASED_OUTPATIENT_CLINIC_OR_DEPARTMENT_OTHER): Payer: Self-pay

## 2021-07-25 NOTE — Telephone Encounter (Signed)
Completed FMLA paper work was faxed to [employer] [phone] ?

## 2021-07-31 ENCOUNTER — Encounter (HOSPITAL_BASED_OUTPATIENT_CLINIC_OR_DEPARTMENT_OTHER): Payer: Self-pay | Admitting: Nurse Practitioner

## 2021-08-01 ENCOUNTER — Other Ambulatory Visit (HOSPITAL_COMMUNITY): Payer: Self-pay | Admitting: Neurosurgery

## 2021-08-01 ENCOUNTER — Other Ambulatory Visit: Payer: Self-pay | Admitting: Neurosurgery

## 2021-08-01 DIAGNOSIS — M5416 Radiculopathy, lumbar region: Secondary | ICD-10-CM

## 2021-08-04 ENCOUNTER — Other Ambulatory Visit (HOSPITAL_BASED_OUTPATIENT_CLINIC_OR_DEPARTMENT_OTHER): Payer: Self-pay | Admitting: Nurse Practitioner

## 2021-08-05 ENCOUNTER — Other Ambulatory Visit (HOSPITAL_COMMUNITY): Payer: Self-pay

## 2021-08-05 ENCOUNTER — Encounter (HOSPITAL_BASED_OUTPATIENT_CLINIC_OR_DEPARTMENT_OTHER): Payer: Self-pay | Admitting: Nurse Practitioner

## 2021-08-05 MED ORDER — FERROUS SULFATE 325 (65 FE) MG PO TABS
325.0000 mg | ORAL_TABLET | Freq: Every day | ORAL | 3 refills | Status: DC
Start: 2021-08-05 — End: 2021-10-30
  Filled 2021-08-05: qty 30, 30d supply, fill #0

## 2021-08-13 ENCOUNTER — Ambulatory Visit (HOSPITAL_COMMUNITY)
Admission: RE | Admit: 2021-08-13 | Discharge: 2021-08-13 | Disposition: A | Discharge status: Home / Self Care | Payer: 59 | Source: Ambulatory Visit | Attending: Neurosurgery | Admitting: Neurosurgery

## 2021-08-13 DIAGNOSIS — M5416 Radiculopathy, lumbar region: Secondary | ICD-10-CM | POA: Insufficient documentation

## 2021-08-13 DIAGNOSIS — M545 Low back pain, unspecified: Secondary | ICD-10-CM | POA: Diagnosis not present
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
## 2021-08-19 ENCOUNTER — Encounter (HOSPITAL_BASED_OUTPATIENT_CLINIC_OR_DEPARTMENT_OTHER): Payer: Self-pay | Admitting: Nurse Practitioner

## 2021-08-19 DIAGNOSIS — M5416 Radiculopathy, lumbar region: Secondary | ICD-10-CM | POA: Diagnosis not present

## 2021-08-27 ENCOUNTER — Encounter (HOSPITAL_BASED_OUTPATIENT_CLINIC_OR_DEPARTMENT_OTHER): Payer: Self-pay | Admitting: Obstetrics & Gynecology

## 2021-08-27 ENCOUNTER — Ambulatory Visit (INDEPENDENT_AMBULATORY_CARE_PROVIDER_SITE_OTHER): Payer: 59 | Admitting: Obstetrics & Gynecology

## 2021-08-27 VITALS — BP 157/82 | HR 62 | Ht 66.5 in | Wt 186.8 lb

## 2021-08-27 DIAGNOSIS — N843 Polyp of vulva: Secondary | ICD-10-CM | POA: Diagnosis not present

## 2021-08-27 DIAGNOSIS — N9089 Other specified noninflammatory disorders of vulva and perineum: Secondary | ICD-10-CM

## 2021-08-27 DIAGNOSIS — D251 Intramural leiomyoma of uterus: Secondary | ICD-10-CM

## 2021-08-27 DIAGNOSIS — D252 Subserosal leiomyoma of uterus: Secondary | ICD-10-CM

## 2021-08-27 DIAGNOSIS — D250 Submucous leiomyoma of uterus: Secondary | ICD-10-CM | POA: Diagnosis not present

## 2021-08-27 DIAGNOSIS — N920 Excessive and frequent menstruation with regular cycle: Secondary | ICD-10-CM

## 2021-08-28 ENCOUNTER — Other Ambulatory Visit (HOSPITAL_COMMUNITY)
Admission: RE | Admit: 2021-08-28 | Discharge: 2021-08-28 | Disposition: A | Discharge status: Home / Self Care | Payer: 59 | Source: Ambulatory Visit | Attending: Obstetrics & Gynecology | Admitting: Obstetrics & Gynecology

## 2021-08-28 DIAGNOSIS — N9089 Other specified noninflammatory disorders of vulva and perineum: Secondary | ICD-10-CM | POA: Insufficient documentation

## 2021-08-29 ENCOUNTER — Encounter (HOSPITAL_BASED_OUTPATIENT_CLINIC_OR_DEPARTMENT_OTHER): Payer: Self-pay | Admitting: Nurse Practitioner

## 2021-08-30 LAB — SURGICAL PATHOLOGY
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
## 2021-09-11 ENCOUNTER — Encounter (HOSPITAL_BASED_OUTPATIENT_CLINIC_OR_DEPARTMENT_OTHER): Payer: Self-pay | Admitting: Obstetrics & Gynecology

## 2021-09-13 ENCOUNTER — Encounter (HOSPITAL_BASED_OUTPATIENT_CLINIC_OR_DEPARTMENT_OTHER): Payer: Self-pay | Admitting: Nurse Practitioner

## 2021-09-13 ENCOUNTER — Other Ambulatory Visit (HOSPITAL_BASED_OUTPATIENT_CLINIC_OR_DEPARTMENT_OTHER): Payer: Self-pay | Admitting: Obstetrics & Gynecology

## 2021-09-13 ENCOUNTER — Encounter (HOSPITAL_BASED_OUTPATIENT_CLINIC_OR_DEPARTMENT_OTHER): Payer: Self-pay

## 2021-09-13 DIAGNOSIS — Z01818 Encounter for other preprocedural examination: Secondary | ICD-10-CM

## 2021-09-13 DIAGNOSIS — D500 Iron deficiency anemia secondary to blood loss (chronic): Secondary | ICD-10-CM

## 2021-09-13 DIAGNOSIS — D251 Intramural leiomyoma of uterus: Secondary | ICD-10-CM

## 2021-09-19 DIAGNOSIS — H5210 Myopia, unspecified eye: Secondary | ICD-10-CM | POA: Diagnosis not present

## 2021-09-19 DIAGNOSIS — H1045 Other chronic allergic conjunctivitis: Secondary | ICD-10-CM | POA: Diagnosis not present

## 2021-10-01 ENCOUNTER — Encounter (HOSPITAL_BASED_OUTPATIENT_CLINIC_OR_DEPARTMENT_OTHER): Payer: Self-pay | Admitting: Nurse Practitioner

## 2021-10-01 ENCOUNTER — Encounter (HOSPITAL_BASED_OUTPATIENT_CLINIC_OR_DEPARTMENT_OTHER): Payer: Self-pay | Admitting: Obstetrics & Gynecology

## 2021-10-07 ENCOUNTER — Ambulatory Visit (INDEPENDENT_AMBULATORY_CARE_PROVIDER_SITE_OTHER): Payer: 59 | Admitting: Nurse Practitioner

## 2021-10-07 ENCOUNTER — Other Ambulatory Visit (HOSPITAL_COMMUNITY): Payer: Self-pay

## 2021-10-07 ENCOUNTER — Encounter (HOSPITAL_BASED_OUTPATIENT_CLINIC_OR_DEPARTMENT_OTHER): Payer: Self-pay | Admitting: Nurse Practitioner

## 2021-10-07 DIAGNOSIS — F4323 Adjustment disorder with mixed anxiety and depressed mood: Secondary | ICD-10-CM

## 2021-10-07 DIAGNOSIS — F330 Major depressive disorder, recurrent, mild: Secondary | ICD-10-CM

## 2021-10-07 MED ORDER — ESCITALOPRAM OXALATE 10 MG PO TABS
20.0000 mg | ORAL_TABLET | Freq: Every day | ORAL | 3 refills | Status: DC
  Filled 2021-10-07: qty 180, 90d supply, fill #0

## 2021-10-07 NOTE — Patient Instructions (Signed)
Increase the lexapro to '20mg'$  (2 tabs) a day. I sent a new order in for you.   Let me know how you are feeling on the increased dose in about 2 weeks.
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
## 2021-10-07 NOTE — Assessment & Plan Note (Signed)
Recent exacerbation of symptoms due to increased stressors. Will plan to increase Lexapro to '20mg'$  a day and monitor. Patient will reach out in 2-3 weeks through [facility] to let me know how she is doing on the increased dose. Very proud of her for seeking counseling. No alarm sx present at this time.

## 2021-10-16 ENCOUNTER — Encounter (HOSPITAL_BASED_OUTPATIENT_CLINIC_OR_DEPARTMENT_OTHER): Payer: Self-pay | Admitting: Nurse Practitioner

## 2021-10-23 ENCOUNTER — Encounter (HOSPITAL_BASED_OUTPATIENT_CLINIC_OR_DEPARTMENT_OTHER): Payer: Self-pay | Admitting: Obstetrics & Gynecology

## 2021-10-23 ENCOUNTER — Ambulatory Visit (INDEPENDENT_AMBULATORY_CARE_PROVIDER_SITE_OTHER): Payer: 59 | Admitting: Obstetrics & Gynecology

## 2021-10-23 VITALS — BP 144/79 | HR 62 | Ht 66.0 in | Wt 187.6 lb

## 2021-10-23 DIAGNOSIS — Z8742 Personal history of other diseases of the female genital tract: Secondary | ICD-10-CM | POA: Diagnosis not present

## 2021-10-23 DIAGNOSIS — D219 Benign neoplasm of connective and other soft tissue, unspecified: Secondary | ICD-10-CM | POA: Diagnosis not present

## 2021-10-23 DIAGNOSIS — Z98891 History of uterine scar from previous surgery: Secondary | ICD-10-CM

## 2021-10-23 DIAGNOSIS — D500 Iron deficiency anemia secondary to blood loss (chronic): Secondary | ICD-10-CM

## 2021-10-24 ENCOUNTER — Other Ambulatory Visit: Payer: Self-pay

## 2021-10-24 ENCOUNTER — Encounter (HOSPITAL_BASED_OUTPATIENT_CLINIC_OR_DEPARTMENT_OTHER): Payer: Self-pay | Admitting: Obstetrics & Gynecology

## 2021-10-24 NOTE — Progress Notes (Signed)
Spoke w/ via phone for pre-op interview---pt Lab needs dos---- urine preg              Lab results------lab appt 10-28-2021 [facility] bmp t & s ekg COVID test -----patient states asymptomatic no test needed Arrive at -------845 am 10-30-2021 NPO after MN NO Solid Food.  Clear liquids from MN until---745 am Med rec completed Medications to take morning of surgery -----amlodipine, lexapro Diabetic medication -----n/a Patient instructed no nail polish to be worn day of surgery Patient instructed to bring photo id and insurance card day of surgery Patient aware to have Driver (ride ) / caregiver  mother Peter Congo Keeran will stay   for 24 hours after surgery  Patient Special Instructions -----pt given extended  stay instructions Pre-Op special Istructions -----none Patient verbalized understanding of instructions that were given at this phone interview. Patient denies shortness of breath, chest pain, fever, cough at this phone interview.

## 2021-10-24 NOTE — Progress Notes (Signed)
Your procedure is scheduled on 10-30-2021  Report to [place] AM.   Call this number if you have problems the morning of surgery  [phone].   OUR ADDRESS IS [place].  WE ARE LOCATED IN [facility].  PLEASE BRING YOUR INSURANCE CARD AND PHOTO ID DAY OF SURGERY.  ONLY 2 PEOPLE ARE ALLOWED IN  WAITING  ROOM.                                      REMEMBER:  DO NOT EAT FOOD, CANDY GUM OR MINTS  AFTER MIDNIGHT THE NIGHT BEFORE YOUR SURGERY . YOU MAY HAVE CLEAR LIQUIDS FROM MIDNIGHT THE NIGHT BEFORE YOUR SURGERY UNTIL 745 AM. NO CLEAR LIQUIDS AFTER 745 AM  DAY OF SURGERY.  YOU MAY  BRUSH YOUR TEETH MORNING OF SURGERY AND RINSE YOUR MOUTH OUT, NO CHEWING GUM CANDY OR MINTS.     CLEAR LIQUID DIET   Foods Allowed                                                                     Foods Excluded  Coffee and tea, regular and decaf                             liquids that you cannot  Plain Jell-O                                                                   see through such as: Fruit ices (not with fruit pulp)                                     milk, soups, orange juice  Plain  Popsicles                                    All solid food Carbonated beverages, regular and diet                                    Cranberry, grape and apple juices Sports drinks like Gatorade _____________________________________________________________________     TAKE THESE MEDICATIONS MORNING OF SURGERY: AMLODIPINE, LEXAPRO    UP TO 4 VISITORS  MAY VISIT IN THE EXTENDED RECOVERY ROOM UNTIL 800 PM ONLY.  ONE  VISITOR AGE 22 AND OVER MAY SPEND THE NIGHT AND MUST BE IN EXTENDED RECOVERY ROOM NO LATER THAN 800 PM . YOUR DISCHARGE TIME AFTER YOU SPEND THE NIGHT IS 900 AM THE MORNING AFTER YOUR SURGERY.  YOU MAY PACK A SMALL OVERNIGHT BAG WITH TOILETRIES FOR YOUR OVERNIGHT STAY IF YOU WISH.  YOUR PRESCRIPTION MEDICATIONS WILL BE PROVIDED  DURING [place].                                      DO NOT WEAR JEWERLY, MAKE UP. DO NOT WEAR LOTIONS, POWDERS, PERFUMES OR NAIL POLISH ON YOUR FINGERNAILS. TOENAIL POLISH IS OK TO WEAR. DO NOT SHAVE FOR 48 HOURS PRIOR TO DAY OF SURGERY. MEN MAY SHAVE FACE AND NECK. CONTACTS, GLASSES, OR DENTURES MAY NOT BE WORN TO SURGERY.  REMEMBER: NO SMOKING, DRUGS OR ALCOHOL FOR 24 HOURS BEFORE YOUR SURGERY.                                    [facility] IS NOT RESPONSIBLE  FOR ANY BELONGINGS.                                                                    Marland Kitchen           [place] - Preparing for Surgery Before surgery, you can play an important role.  Because skin is not sterile, your skin needs to be as free of germs as possible.  You can reduce the number of germs on your skin by washing with CHG (chlorahexidine gluconate) soap before surgery.  CHG is an antiseptic cleaner which kills germs and bonds with the skin to continue killing germs even after washing. Please DO NOT use if you have an allergy to CHG or antibacterial soaps.  If your skin becomes reddened/irritated stop using the CHG and inform your nurse when you arrive at Short Stay. Do not shave (including legs and underarms) for at least 48 hours prior to the first CHG shower.  You may shave your face/neck. Please follow these instructions carefully:  1.  Shower with CHG Soap the night before surgery and the  morning of Surgery.  2.  If you choose to wash your hair, wash your hair first as usual with your  normal  shampoo.  3.  After you shampoo, rinse your hair and body thoroughly to remove the  shampoo.                            4.  Use CHG as you would any other liquid soap.  You can apply chg directly  to the skin and wash , please wash your belly button thoroughly with chg soap provided night before and morning of your surgery.                     Gently with a scrungie or clean washcloth.  5.  Apply the CHG Soap to your  body ONLY FROM THE NECK DOWN.   Do not use on face/ open                           Wound or open sores. Avoid contact with eyes, ears mouth and genitals (private parts).  Wash face,  Genitals (private parts) with your normal soap.             6.  Wash thoroughly, paying special attention to the area where your surgery  will be performed.  7.  Thoroughly rinse your body with warm water from the neck down.  8.  DO NOT shower/wash with your normal soap after using and rinsing off  the CHG Soap.                9.  Pat yourself dry with a clean towel.            10.  Wear clean pajamas.            11.  Place clean sheets on your bed the night of your first shower and do not  sleep with pets. Day of Surgery : Do not apply any lotions/deodorants the morning of surgery.  Please wear clean clothes to the hospital/surgery center.  IF YOU HAVE ANY SKIN IRRITATION OR PROBLEMS WITH THE SURGICAL SOAP, PLEASE GET A BAR OF GOLD DIAL SOAP AND SHOWER THE NIGHT BEFORE YOUR SURGERY AND THE MORNING OF YOUR SURGERY. PLEASE LET THE NURSE KNOW MORNING OF YOUR SURGERY IF YOU HAD ANY PROBLEMS WITH THE SURGICAL SOAP.   ________________________________________________________________________                                                        QUESTIONS Hansel Feinstein  PRE OP NURSE PHONE [phone].

## 2021-10-28 ENCOUNTER — Encounter (HOSPITAL_COMMUNITY)
Admission: RE | Admit: 2021-10-28 | Discharge: 2021-10-28 | Disposition: A | Discharge status: Home / Self Care | Payer: 59 | Source: Ambulatory Visit | Attending: Obstetrics & Gynecology | Admitting: Obstetrics & Gynecology

## 2021-10-28 DIAGNOSIS — Z01818 Encounter for other preprocedural examination: Secondary | ICD-10-CM | POA: Insufficient documentation

## 2021-10-28 DIAGNOSIS — D500 Iron deficiency anemia secondary to blood loss (chronic): Secondary | ICD-10-CM | POA: Diagnosis not present

## 2021-10-28 LAB — BASIC METABOLIC PANEL
Anion gap: 8 (ref 5–15)
BUN: 11 mg/dL (ref 6–20)
CO2: 23 mmol/L (ref 22–32)
Calcium: 9.5 mg/dL (ref 8.9–10.3)
Chloride: 108 mmol/L (ref 98–111)
Creatinine, Ser: 0.8 mg/dL (ref 0.44–1.00)
GFR, Estimated: >60 mL/min (ref >60)
Glucose, Bld: 84 mg/dL (ref 70–99)
Potassium: 3.4 mmol/L — ABNORMAL LOW (ref 3.5–5.1)
Sodium: 139 mmol/L (ref 135–145)

## 2021-10-28 LAB — CBC
HCT: 37.3 % (ref 36.0–46.0)
Hemoglobin: 12.3 g/dL (ref 12.0–15.0)
MCH: 29.1 pg (ref 26.0–34.0)
MCHC: 33 g/dL (ref 30.0–36.0)
MCV: 88.2 fL (ref 80.0–100.0)
Platelets: 361 10*3/uL (ref 150–400)
RBC: 4.23 MIL/uL (ref 3.87–5.11)
RDW: 12.8 % (ref 11.5–15.5)
WBC: 8.2 10*3/uL (ref 4.0–10.5)
nRBC: 0 % (ref 0.0–0.2)

## 2021-10-29 ENCOUNTER — Other Ambulatory Visit (HOSPITAL_BASED_OUTPATIENT_CLINIC_OR_DEPARTMENT_OTHER): Payer: Self-pay | Admitting: Nurse Practitioner

## 2021-10-29 ENCOUNTER — Other Ambulatory Visit (HOSPITAL_COMMUNITY): Payer: Self-pay

## 2021-10-29 DIAGNOSIS — I10 Essential (primary) hypertension: Secondary | ICD-10-CM

## 2021-10-30 ENCOUNTER — Ambulatory Visit (HOSPITAL_BASED_OUTPATIENT_CLINIC_OR_DEPARTMENT_OTHER): Payer: 59 | Admitting: Anesthesiology

## 2021-10-30 ENCOUNTER — Other Ambulatory Visit (HOSPITAL_BASED_OUTPATIENT_CLINIC_OR_DEPARTMENT_OTHER): Payer: Self-pay | Admitting: Obstetrics & Gynecology

## 2021-10-30 ENCOUNTER — Ambulatory Visit (HOSPITAL_BASED_OUTPATIENT_CLINIC_OR_DEPARTMENT_OTHER)
Admission: RE | Admit: 2021-10-30 | Discharge: 2021-10-30 | Disposition: A | Discharge status: Home / Self Care | Payer: 59 | Attending: Obstetrics & Gynecology | Admitting: Obstetrics & Gynecology

## 2021-10-30 ENCOUNTER — Encounter (HOSPITAL_BASED_OUTPATIENT_CLINIC_OR_DEPARTMENT_OTHER)
Admission: RE | Disposition: A | Discharge status: Home / Self Care | Payer: Self-pay | Source: Home / Self Care | Attending: Obstetrics & Gynecology

## 2021-10-30 ENCOUNTER — Other Ambulatory Visit: Payer: Self-pay

## 2021-10-30 ENCOUNTER — Encounter (HOSPITAL_BASED_OUTPATIENT_CLINIC_OR_DEPARTMENT_OTHER): Payer: Self-pay | Admitting: Obstetrics & Gynecology

## 2021-10-30 ENCOUNTER — Other Ambulatory Visit (HOSPITAL_COMMUNITY): Payer: Self-pay

## 2021-10-30 DIAGNOSIS — R519 Headache, unspecified: Secondary | ICD-10-CM | POA: Insufficient documentation

## 2021-10-30 DIAGNOSIS — D250 Submucous leiomyoma of uterus: Secondary | ICD-10-CM

## 2021-10-30 DIAGNOSIS — N920 Excessive and frequent menstruation with regular cycle: Secondary | ICD-10-CM | POA: Diagnosis not present

## 2021-10-30 DIAGNOSIS — D500 Iron deficiency anemia secondary to blood loss (chronic): Secondary | ICD-10-CM

## 2021-10-30 DIAGNOSIS — I10 Essential (primary) hypertension: Secondary | ICD-10-CM | POA: Diagnosis not present

## 2021-10-30 DIAGNOSIS — M1990 Unspecified osteoarthritis, unspecified site: Secondary | ICD-10-CM | POA: Diagnosis not present

## 2021-10-30 DIAGNOSIS — D251 Intramural leiomyoma of uterus: Secondary | ICD-10-CM | POA: Diagnosis not present

## 2021-10-30 DIAGNOSIS — M5416 Radiculopathy, lumbar region: Secondary | ICD-10-CM | POA: Insufficient documentation

## 2021-10-30 DIAGNOSIS — D259 Leiomyoma of uterus, unspecified: Secondary | ICD-10-CM | POA: Diagnosis not present

## 2021-10-30 DIAGNOSIS — Z01818 Encounter for other preprocedural examination: Secondary | ICD-10-CM

## 2021-10-30 DIAGNOSIS — M5432 Sciatica, left side: Secondary | ICD-10-CM | POA: Insufficient documentation

## 2021-10-30 DIAGNOSIS — D252 Subserosal leiomyoma of uterus: Secondary | ICD-10-CM

## 2021-10-30 DIAGNOSIS — N858 Other specified noninflammatory disorders of uterus: Secondary | ICD-10-CM | POA: Diagnosis not present

## 2021-10-30 HISTORY — DX: Essential (primary) hypertension: I10

## 2021-10-30 HISTORY — PX: CYSTOSCOPY: SHX5120

## 2021-10-30 HISTORY — PX: TOTAL LAPAROSCOPIC HYSTERECTOMY WITH SALPINGECTOMY: SHX6742

## 2021-10-30 LAB — TYPE AND SCREEN
ABO/RH(D): A NEG
Antibody Screen: NEGATIVE

## 2021-10-30 LAB — ABO/RH: ABO/RH(D): A NEG

## 2021-10-30 LAB — POCT PREGNANCY, URINE: Preg Test, Ur: NEGATIVE

## 2021-10-30 LAB — HEMOGLOBIN: Hemoglobin: 12.1 g/dL (ref 12.0–15.0)

## 2021-10-30 SURGERY — HYSTERECTOMY, TOTAL, LAPAROSCOPIC, WITH SALPINGECTOMY
Anesthesia: General | Site: Bladder

## 2021-10-30 MED ORDER — IBUPROFEN 800 MG PO TABS
800.0000 mg | ORAL_TABLET | Freq: Three times a day (TID) | ORAL | 0 refills | Status: DC | PRN
  Filled 2021-10-30: qty 30, 10d supply, fill #0

## 2021-10-30 MED ORDER — KETOROLAC TROMETHAMINE 30 MG/ML IJ SOLN
INTRAMUSCULAR | Status: DC | PRN
  Administered 2021-10-30: 30 mg via INTRAVENOUS

## 2021-10-30 MED ORDER — ENOXAPARIN SODIUM 40 MG/0.4ML IJ SOSY: 40.0000 mg | PREFILLED_SYRINGE | INTRAMUSCULAR | Status: DC

## 2021-10-30 MED ORDER — OXYCODONE-ACETAMINOPHEN 5-325 MG PO TABS
1.0000 | ORAL_TABLET | ORAL | Status: DC | PRN
  Administered 2021-10-30: 2 via ORAL

## 2021-10-30 MED ORDER — FENTANYL CITRATE (PF) 100 MCG/2ML IJ SOLN
25.0000 ug | INTRAMUSCULAR | Status: DC | PRN
  Administered 2021-10-30 (×2): 50 ug via INTRAVENOUS

## 2021-10-30 MED ORDER — SODIUM CHLORIDE 0.9 % IV SOLN
INTRAVENOUS | Status: AC
  Filled 2021-10-30: qty 2

## 2021-10-30 MED ORDER — IBUPROFEN 200 MG PO TABS: 600.0000 mg | ORAL_TABLET | Freq: Four times a day (QID) | ORAL | Status: DC

## 2021-10-30 MED ORDER — ENOXAPARIN SODIUM 40 MG/0.4ML IJ SOSY
40.0000 mg | PREFILLED_SYRINGE | INTRAMUSCULAR | Status: AC
  Administered 2021-10-30: 40 mg via SUBCUTANEOUS

## 2021-10-30 MED ORDER — AMISULPRIDE (ANTIEMETIC) 5 MG/2ML IV SOLN: 10.0000 mg | Freq: Once | INTRAVENOUS | Status: DC | PRN

## 2021-10-30 MED ORDER — SODIUM CHLORIDE 0.9 % IV SOLN
2.0000 g | INTRAVENOUS | Status: AC
  Administered 2021-10-30: 2 g via INTRAVENOUS

## 2021-10-30 MED ORDER — OXYCODONE-ACETAMINOPHEN 5-325 MG PO TABS
ORAL_TABLET | ORAL | Status: AC
  Filled 2021-10-30: qty 2

## 2021-10-30 MED ORDER — MIDAZOLAM HCL 2 MG/2ML IJ SOLN
INTRAMUSCULAR | Status: AC
  Filled 2021-10-30: qty 2

## 2021-10-30 MED ORDER — ONDANSETRON HCL 4 MG/2ML IJ SOLN
INTRAMUSCULAR | Status: DC | PRN
  Administered 2021-10-30: 4 mg via INTRAVENOUS

## 2021-10-30 MED ORDER — PANTOPRAZOLE SODIUM 40 MG IV SOLR: 40.0000 mg | Freq: Every day | INTRAVENOUS | Status: DC

## 2021-10-30 MED ORDER — FENTANYL CITRATE (PF) 250 MCG/5ML IJ SOLN
INTRAMUSCULAR | Status: DC | PRN
  Administered 2021-10-30: 50 ug via INTRAVENOUS
  Administered 2021-10-30: 100 ug via INTRAVENOUS
  Administered 2021-10-30 (×2): 50 ug via INTRAVENOUS

## 2021-10-30 MED ORDER — FENTANYL CITRATE (PF) 250 MCG/5ML IJ SOLN
INTRAMUSCULAR | Status: AC
  Filled 2021-10-30: qty 5

## 2021-10-30 MED ORDER — MORPHINE SULFATE (PF) 4 MG/ML IV SOLN: 1.0000 mg | INTRAVENOUS | Status: DC | PRN

## 2021-10-30 MED ORDER — DEXTROSE-NACL 5-0.45 % IV SOLN: INTRAVENOUS | Status: DC

## 2021-10-30 MED ORDER — BUPIVACAINE HCL (PF) 0.25 % IJ SOLN
INTRAMUSCULAR | Status: DC | PRN
  Administered 2021-10-30: 11 mL

## 2021-10-30 MED ORDER — PROPOFOL 10 MG/ML IV BOLUS
INTRAVENOUS | Status: AC
  Filled 2021-10-30: qty 20

## 2021-10-30 MED ORDER — SCOPOLAMINE 1 MG/3DAYS TD PT72
MEDICATED_PATCH | TRANSDERMAL | Status: AC
  Filled 2021-10-30: qty 1

## 2021-10-30 MED ORDER — KETOROLAC TROMETHAMINE 30 MG/ML IJ SOLN: 30.0000 mg | Freq: Four times a day (QID) | INTRAMUSCULAR | Status: DC

## 2021-10-30 MED ORDER — MENTHOL 3 MG MT LOZG: 1.0000 | LOZENGE | OROMUCOSAL | Status: DC | PRN

## 2021-10-30 MED ORDER — OXYCODONE-ACETAMINOPHEN 5-325 MG PO TABS
1.0000 | ORAL_TABLET | Freq: Four times a day (QID) | ORAL | 0 refills | Status: AC | PRN
  Filled 2021-10-30: qty 30, 4d supply, fill #0

## 2021-10-30 MED ORDER — ONDANSETRON HCL 4 MG/2ML IJ SOLN: 4.0000 mg | Freq: Once | INTRAMUSCULAR | Status: DC | PRN

## 2021-10-30 MED ORDER — ONDANSETRON HCL 4 MG PO TABS: 4.0000 mg | ORAL_TABLET | Freq: Four times a day (QID) | ORAL | Status: DC | PRN

## 2021-10-30 MED ORDER — SUGAMMADEX SODIUM 200 MG/2ML IV SOLN
INTRAVENOUS | Status: DC | PRN
  Administered 2021-10-30: 200 mg via INTRAVENOUS

## 2021-10-30 MED ORDER — SIMETHICONE 80 MG PO CHEW: 80.0000 mg | CHEWABLE_TABLET | Freq: Four times a day (QID) | ORAL | Status: DC | PRN

## 2021-10-30 MED ORDER — MIDAZOLAM HCL 5 MG/5ML IJ SOLN
INTRAMUSCULAR | Status: DC | PRN
  Administered 2021-10-30: 2 mg via INTRAVENOUS

## 2021-10-30 MED ORDER — ROCURONIUM BROMIDE 10 MG/ML (PF) SYRINGE
PREFILLED_SYRINGE | INTRAVENOUS | Status: DC | PRN
  Administered 2021-10-30: 70 mg via INTRAVENOUS
  Administered 2021-10-30: 10 mg via INTRAVENOUS

## 2021-10-30 MED ORDER — ACETAMINOPHEN 500 MG PO TABS
1000.0000 mg | ORAL_TABLET | ORAL | Status: AC
  Administered 2021-10-30: 1000 mg via ORAL

## 2021-10-30 MED ORDER — GABAPENTIN 100 MG PO CAPS
ORAL_CAPSULE | ORAL | Status: AC
  Filled 2021-10-30: qty 1

## 2021-10-30 MED ORDER — LIDOCAINE 2% (20 MG/ML) 5 ML SYRINGE
INTRAMUSCULAR | Status: DC | PRN
  Administered 2021-10-30: 80 mg via INTRAVENOUS

## 2021-10-30 MED ORDER — OXYCODONE HCL 5 MG PO TABS: 5.0000 mg | ORAL_TABLET | Freq: Once | ORAL | Status: DC | PRN

## 2021-10-30 MED ORDER — FENTANYL CITRATE (PF) 100 MCG/2ML IJ SOLN
INTRAMUSCULAR | Status: AC
  Filled 2021-10-30: qty 2

## 2021-10-30 MED ORDER — DEXAMETHASONE SODIUM PHOSPHATE 10 MG/ML IJ SOLN
INTRAMUSCULAR | Status: DC | PRN
  Administered 2021-10-30: 10 mg via INTRAVENOUS

## 2021-10-30 MED ORDER — SCOPOLAMINE 1 MG/3DAYS TD PT72
1.0000 | MEDICATED_PATCH | TRANSDERMAL | Status: DC
Start: 2021-10-30 — End: 2021-10-30
  Administered 2021-10-30: 1.5 mg via TRANSDERMAL

## 2021-10-30 MED ORDER — OXYCODONE HCL 5 MG/5ML PO SOLN: 5.0000 mg | Freq: Once | ORAL | Status: DC | PRN

## 2021-10-30 MED ORDER — ACETAMINOPHEN 500 MG PO TABS
ORAL_TABLET | ORAL | Status: AC
  Filled 2021-10-30: qty 2

## 2021-10-30 MED ORDER — LACTATED RINGERS IV SOLN
INTRAVENOUS | Status: DC
  Administered 2021-10-30: 1000 mL via INTRAVENOUS

## 2021-10-30 MED ORDER — ONDANSETRON HCL 4 MG/2ML IJ SOLN
INTRAMUSCULAR | Status: AC
  Filled 2021-10-30: qty 2

## 2021-10-30 MED ORDER — LIDOCAINE HCL (PF) 2 % IJ SOLN
INTRAMUSCULAR | Status: AC
  Filled 2021-10-30: qty 5

## 2021-10-30 MED ORDER — ONDANSETRON HCL 4 MG/2ML IJ SOLN: 4.0000 mg | Freq: Four times a day (QID) | INTRAMUSCULAR | Status: DC | PRN

## 2021-10-30 MED ORDER — GABAPENTIN 100 MG PO CAPS
100.0000 mg | ORAL_CAPSULE | Freq: Three times a day (TID) | ORAL | Status: DC
  Administered 2021-10-30: 100 mg via ORAL

## 2021-10-30 MED ORDER — ALUM & MAG HYDROXIDE-SIMETH 200-200-20 MG/5ML PO SUSP
30.0000 mL | ORAL | Status: DC | PRN
Start: 2021-10-30 — End: 2021-10-30

## 2021-10-30 MED ORDER — PROPOFOL 10 MG/ML IV BOLUS
INTRAVENOUS | Status: DC | PRN
  Administered 2021-10-30: 170 mg via INTRAVENOUS

## 2021-10-30 MED ORDER — EPHEDRINE SULFATE-NACL 50-0.9 MG/10ML-% IV SOSY
PREFILLED_SYRINGE | INTRAVENOUS | Status: DC | PRN
  Administered 2021-10-30: 5 mg via INTRAVENOUS

## 2021-10-30 MED ORDER — SODIUM CHLORIDE 0.9 % IV SOLN
INTRAVENOUS | Status: DC | PRN
  Administered 2021-10-30: 10 mL

## 2021-10-30 MED ORDER — DEXAMETHASONE SODIUM PHOSPHATE 10 MG/ML IJ SOLN
INTRAMUSCULAR | Status: AC
  Filled 2021-10-30: qty 1

## 2021-10-30 MED ORDER — POVIDONE-IODINE 10 % EX SWAB: 2.0000 | Freq: Once | CUTANEOUS | Status: DC

## 2021-10-30 MED ORDER — EPHEDRINE 5 MG/ML INJ
INTRAVENOUS | Status: AC
  Filled 2021-10-30: qty 5

## 2021-10-30 MED ORDER — ENOXAPARIN SODIUM 40 MG/0.4ML IJ SOSY
PREFILLED_SYRINGE | INTRAMUSCULAR | Status: AC
  Filled 2021-10-30: qty 0.4

## 2021-10-30 MED ORDER — KETOROLAC TROMETHAMINE 30 MG/ML IJ SOLN
INTRAMUSCULAR | Status: AC
  Filled 2021-10-30: qty 1

## 2021-10-30 MED ORDER — ROCURONIUM BROMIDE 10 MG/ML (PF) SYRINGE
PREFILLED_SYRINGE | INTRAVENOUS | Status: AC
  Filled 2021-10-30: qty 10

## 2021-10-30 SURGICAL SUPPLY — 69 items
ADH SKN CLS APL DERMABOND .7 (GAUZE/BANDAGES/DRESSINGS) ×2
APL ESCP 73.6OZ SRGCL (TIP) ×2
APL PRP STRL LF DISP 70% ISPRP (MISCELLANEOUS) ×2
APL SRG 38 LTWT LNG FL B (MISCELLANEOUS)
APPLICATOR ARISTA FLEXITIP XL (MISCELLANEOUS) IMPLANT
BLADE SURG 10 STRL SS (BLADE) IMPLANT
CABLE HIGH FREQUENCY MONO STRZ (ELECTRODE) IMPLANT
CELL SAVER LIPIGURD (MISCELLANEOUS) IMPLANT
CHLORAPREP W/TINT 26 (MISCELLANEOUS) ×3 IMPLANT
COVER BACK TABLE 60X90IN (DRAPES) ×3 IMPLANT
COVER MAYO STAND STRL (DRAPES) ×3 IMPLANT
COVER SURGICAL LIGHT HANDLE (MISCELLANEOUS) IMPLANT
DERMABOND ADVANCED (GAUZE/BANDAGES/DRESSINGS) ×1
DERMABOND ADVANCED .7 DNX12 (GAUZE/BANDAGES/DRESSINGS) ×2 IMPLANT
DEVICE RETRIEVAL ALEXIS 14 (MISCELLANEOUS) IMPLANT
DILATOR CANAL MILEX (MISCELLANEOUS) IMPLANT
DRSG COVADERM PLUS 2X2 (GAUZE/BANDAGES/DRESSINGS) IMPLANT
EXTRT SYSTEM ALEXIS 14CM (MISCELLANEOUS)
EXTRT SYSTEM ALEXIS 17CM (MISCELLANEOUS)
GAUZE 4X4 16PLY ~~LOC~~+RFID DBL (SPONGE) ×6 IMPLANT
GLOVE BIO SURGEON STRL SZ 6.5 (GLOVE) ×3 IMPLANT
GLOVE BIOGEL PI IND STRL 6.5 (GLOVE) ×2 IMPLANT
GLOVE BIOGEL PI IND STRL 7.0 (GLOVE) ×4 IMPLANT
GLOVE BIOGEL PI INDICATOR 6.5 (GLOVE) ×1
GLOVE BIOGEL PI INDICATOR 7.0 (GLOVE) ×2
GLOVE ECLIPSE 6.5 STRL STRAW (GLOVE) ×6 IMPLANT
GOWN STRL REUS W/TWL XL LVL3 (GOWN DISPOSABLE) ×6 IMPLANT
HEMOSTAT ARISTA ABSORB 3G PWDR (HEMOSTASIS) IMPLANT
KIT TURNOVER CYSTO (KITS) ×3 IMPLANT
LEGGING LITHOTOMY PAIR STRL (DRAPES) ×3 IMPLANT
LIGASURE VESSEL 5MM BLUNT TIP (ELECTROSURGICAL) ×3 IMPLANT
NEEDLE INSUFFLATION 120MM (ENDOMECHANICALS) ×3 IMPLANT
NS IRRIG 1000ML POUR BTL (IV SOLUTION) ×3 IMPLANT
OCCLUDER COLPOPNEUMO (BALLOONS) ×3 IMPLANT
PACK LAPAROSCOPY BASIN (CUSTOM PROCEDURE TRAY) ×3 IMPLANT
PACK TRENDGUARD 450 HYBRID PRO (MISCELLANEOUS) ×2 IMPLANT
PENCIL SMOKE EVACUATOR (MISCELLANEOUS) IMPLANT
POUCH LAPAROSCOPIC INSTRUMENT (MISCELLANEOUS) ×3 IMPLANT
POWDER SURGICEL 3.0 GRAM (HEMOSTASIS) ×1 IMPLANT
PROTECTOR NERVE ULNAR (MISCELLANEOUS) ×6 IMPLANT
SCISSORS LAP 5X35 DISP (ENDOMECHANICALS) IMPLANT
SET IRRIG Y TYPE TUR BLADDER L (SET/KITS/TRAYS/PACK) ×3 IMPLANT
SET SUCTION IRRIG HYDROSURG (IRRIGATION / IRRIGATOR) ×3 IMPLANT
SET TRI-LUMEN FLTR TB AIRSEAL (TUBING) ×3 IMPLANT
SHEARS HARMONIC ACE PLUS 36CM (ENDOMECHANICALS) ×3 IMPLANT
SUT VIC AB 0 CT1 27 (SUTURE) ×6
SUT VIC AB 0 CT1 27XBRD ANBCTR (SUTURE) ×4 IMPLANT
SUT VIC AB 4-0 PS2 18 (SUTURE) ×3 IMPLANT
SUT VICRYL 0 UR6 27IN ABS (SUTURE) IMPLANT
SUT VLOC 180 0 9IN  GS21 (SUTURE) ×1
SUT VLOC 180 0 9IN GS21 (SUTURE) ×2 IMPLANT
SYR 10ML LL (SYRINGE) ×3 IMPLANT
SYR 50ML LL SCALE MARK (SYRINGE) ×6 IMPLANT
SYSTEM CARTER THOMASON II (TROCAR) IMPLANT
SYSTEM CONTND EXTRCTN KII BLLN (MISCELLANEOUS) IMPLANT
TIP ENDOSCOPIC SURGICEL (TIP) ×1 IMPLANT
TIP UTERINE 5.1X6CM LAV DISP (MISCELLANEOUS) IMPLANT
TIP UTERINE 6.7X10CM GRN DISP (MISCELLANEOUS) ×1 IMPLANT
TIP UTERINE 6.7X6CM WHT DISP (MISCELLANEOUS) IMPLANT
TIP UTERINE 6.7X8CM BLUE DISP (MISCELLANEOUS) IMPLANT
TOWEL OR 17X26 10 PK STRL BLUE (TOWEL DISPOSABLE) ×6 IMPLANT
TRAY FOLEY W/BAG SLVR 14FR LF (SET/KITS/TRAYS/PACK) ×3 IMPLANT
TRENDGUARD 450 HYBRID PRO PACK (MISCELLANEOUS) ×3
TROCAR ADV FIXATION 5X100MM (TROCAR) ×3 IMPLANT
TROCAR BLADELESS OPT 5 100 (ENDOMECHANICALS) ×3 IMPLANT
TROCAR PORT AIRSEAL 5X120 (TROCAR) ×3 IMPLANT
TROCAR XCEL NON BLADE 8MM B8LT (ENDOMECHANICALS) ×3 IMPLANT
WARMER LAPAROSCOPE (MISCELLANEOUS) ×3 IMPLANT
WATER STERILE IRR 3000ML UROMA (IV SOLUTION) ×3 IMPLANT
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
## 2021-10-30 NOTE — Discharge Instructions (Addendum)
Post Op Hysterectomy Instructions Please read the instructions below. Refer to these instructions for the next few weeks. These instructions provide you with general information on caring for yourself after surgery. Your caregiver may also give you specific instructions. While your treatment has been planned according to the most current medical practices available, unavoidable problems sometimes happen. If you have any problems or questions after you leave, please call your caregiver.  HOME CARE INSTRUCTIONS Healing will take time. You will have discomfort, tenderness, swelling and bruising at the operative site for a couple of weeks. This is normal and will get better as time goes on.  Only take over-the-counter or prescription medicines for pain, discomfort or fever as directed by your caregiver.  Do not take aspirin. It can cause bleeding.  Do not drive when taking pain medication.  Follow your caregiver's advice regarding diet, exercise, lifting, driving and general activities.  Resume your usual diet as directed and allowed.  Get plenty of rest and sleep.  Do not douche, use tampons, or have sexual intercourse until your caregiver gives you permission. .  Take your temperature if you feel hot or flushed.  You may shower today when you get home.  No tub bath for one week.   Do not drink alcohol until you are not taking any narcotic pain medications.  Try to have someone home with you for a week or two to help with the household activities.   Be careful over the next two to three weeks with any activities at home that involve lifting, pushing, or pulling.  Listen to your body--if something feels uncomfortable to do, then don't do it. Make sure you and your family understands everything about your operation and recovery.  Walking up stairs is fine. Do not sign any legal documents until you feel normal again.  Keep all your follow-up appointments as recommended by your caregiver.   PLEASE CALL  THE OFFICE IF: There is swelling, redness or increasing pain in the wound area.  Pus is coming from the wound.  You notice a bad smell from the wound or surgical dressing.  You have pain, redness and swelling from the intravenous site.  The wound is breaking open (the edges are not staying together).   You develop pain or bleeding when you urinate.  You develop abnormal vaginal discharge.  You have any type of abnormal reaction or develop an allergy to your medication.  You need stronger pain medication for your pain   SEEK IMMEDIATE MEDICAL CARE: You develop a temperature of 100.5 or higher.  You develop abdominal pain.  You develop chest pain.  You develop shortness of breath.  You pass out.  You develop pain, swelling or redness of your leg.  You develop heavy vaginal bleeding with or without blood clots.   MEDICATIONS: Restart your regular medications BUT wait one week before restarting all vitamins and mineral supplements Use Motrin 800mg every 8 hours for the next several days.  This will help you use less Percocet.  Use the Percocet 5/325 1-2 tabs every 4-6 hours as needed for pain. You may use an over the counter stool softener like Colace or Dulcolax to help with starting a bowel movement.  Start the day after you go home.  Warm liquids, fluids, and ambulation help too.  If you have not had a bowel movement in four days, you need to call the office.  

## 2021-10-30 NOTE — Anesthesia Procedure Notes (Signed)
Procedure Name: Intubation Date/Time: 10/30/2021 11:26 AM  Performed by: Rogers Blocker, CRNAPre-anesthesia Checklist: Patient identified, Emergency Drugs available, Suction available and Patient being monitored Patient Re-evaluated:Patient Re-evaluated prior to induction Oxygen Delivery Method: Circle System Utilized Preoxygenation: Pre-oxygenation with 100% oxygen Induction Type: IV induction Ventilation: Mask ventilation without difficulty Laryngoscope Size: Mac and 3 Grade View: Grade I Tube type: Oral Tube size: 7.0 mm Number of attempts: 1 Airway Equipment and Method: Stylet and Bite block Placement Confirmation: ETT inserted through vocal cords under direct vision, positive ETCO2 and breath sounds checked- equal and bilateral Secured at: 21 cm Tube secured with: Tape Dental Injury: Teeth and Oropharynx as per pre-operative assessment

## 2021-10-30 NOTE — Transfer of Care (Signed)
Immediate Anesthesia Transfer of Care Note  Patient: Caitlyn Richardson  Procedure(s) Performed: TOTAL LAPAROSCOPIC HYSTERECTOMY WITH SALPINGECTOMY (Bilateral: Abdomen) CYSTOSCOPY (Bladder)  Patient Location: PACU  Anesthesia Type:General  Level of Consciousness: awake, alert , oriented and patient cooperative  Airway & Oxygen Therapy: Patient Spontanous Breathing  Post-op Assessment: Report given to RN and Post -op Vital signs reviewed and stable  Post vital signs: Reviewed and stable  Last Vitals:  Vitals Value Taken Time  BP 138/72 10/30/21 1355  Temp    Pulse 74 10/30/21 1355  Resp 18 10/30/21 1355  SpO2 96 % 10/30/21 1355    Last Pain:  Vitals:   10/30/21 0905  TempSrc: Oral  PainSc: 0-No pain      Patients Stated Pain Goal: 7 (89/21/19 4174)  Complications: No notable events documented.

## 2021-10-30 NOTE — Op Note (Signed)
10/30/2021  1:51 PM  PATIENT:  Caitlyn Richardson  41 y.o. female  PRE-OPERATIVE DIAGNOSIS:  Menorrhagia Fibroids, h/o iron deficiency anemia, h/o cesarean section x 1  POST-OPERATIVE DIAGNOSIS:  Menorrhagia Fibroids, h/o iron deficiency anemia, h/o cesarean section x 1  PROCEDURE:  Procedure(s): TOTAL LAPAROSCOPIC HYSTERECTOMY WITH SALPINGECTOMY CYSTOSCOPY  SURGEON:  Megan Salon  ASSISTANTS: Dr. Aletha Halim.  An experienced assistant was required given the standard of surgical care given the complexity of the case.  This assistant was needed for exposure, dissection, suctioning, retraction, instrument exchange and for overall help during the procedure.  RNFA help was also unavailable.  ANESTHESIA:   general  ESTIMATED BLOOD LOSS: 150 mL  BLOOD ADMINISTERED:none   FLUIDS: 1000cc LR  UOP: 150cc clear UOP  SPECIMEN:  uterus, cervix, bilateral fallopian tubes.  Uterus weight 300 grams in the OR after surgery was completed.  DISPOSITION OF SPECIMEN:  PATHOLOGY  FINDINGS: enlarged, bulky uterus, normal upper abdomen, fallopian tubes with portion missing c/w tubal ligation, bladder adhesions  DESCRIPTION OF OPERATION: Patient is taken to the operating room. She is placed in the supine position. She is a running IV in place. Informed consent was present on the chart. SCDs on her lower extremities and functioning properly. Patient was positioned while she was awake.  Her legs were placed in the low lithotomy position in [place]. Her arms were tucked by the side.  General endotracheal anesthesia was administered by the anesthesia staff without difficulty. Dr. Elgie Congo, anesthesia, oversaw case.  Time out performed.    Chlora prep was then used to prep the abdomen and Hibiclens was used to prep the inner thighs, perineum and vagina. Once 3 minutes had past the patient was draped in a normal standard fashion. The legs were lifted to the high lithotomy position. The cervix was visualized  by placing a heavy weighted speculum in the posterior aspect of the vagina and using a curved Deaver retractor to the retract anteriorly. The anterior lip of the cervix was grasped with single-tooth tenaculum.  The cervix sounded to 10 cm. Pratt dilators were used to dilate the cervix up to a #21. A RUMI uterine manipulator was obtained. A #10 disposable tip was placed on the RUMI manipulator as well as a 4.0, silver KOH ring. This was passed through the cervix and the bulb of the disposable tip was inflated with 10 cc of normal saline. There was a good fit of the KOH ring around the cervix. The tenaculum was removed. There is also good manipulation of the uterus. The speculum and retractor were removed as well. A Foley catheter was placed to straight drain.  Clear urine was noted. Legs were lowered to the low lithotomy position and attention was turned the abdomen.  The umbilicus was everted.  A Veress needle was obtained. Syringe of sterile saline was placed on a open Veress needle.  This was passed into the umbilicus until just when the fluid started to drip.  Then low flow CO2 gas was attached the needle and the pneumoperitoneum was achieved without difficulty. Once four liters of gas was in the abdomen the Veress needle was removed and a 5 millimeter non-bladed Optiview trocar and port were passed directly to the abdomen. The laparoscope was then used to confirm intraperitoneal placement. Findings noted above.  Locations for RLQ, LLQ, and suprapubic ports were noted by transillumination of the abdominal wall.  0.25% marcaine was used to anesthetize the skin.  18m skin incision was made in the  RLQ and an AirSeal port was placed underdirect visualization of the laparoscope.  Then a 13m skin incision was made and a 525mnonbladed trochar and port was placed in the LLQ.  Finally, and 54m74mkin incision was made about 4cm above the pubic symphasis and an 54mm354mn-bladed port was placed with direct visualization of  the laparoscope.  All trochars were removed.    Ureters were identifies.  Attention was turned to the left side. With uterus on stretch the left tube was excised off the ovary and mesosalpinx was dissected to free the tube. Then the left utero-ovarian pedicle was serially clamped cauterized and incised using the ligasure device. Left round ligament was serially clamped cauterized and incised. The anterior and posterior peritoneum of the inferior leaf of the broad ligament were opened. The beginning of the bladder flap was created.  The bladder was taken down below the level of the KOH ring. The left uterine artery skeletonized and then just superior to the KOH ring this vessel was serially clamped, cauterized, and incised.  Attention was turned the right side.  The uterus was placed on stretch to the opposite side.  The tube was excised off the ovary using sharp dissection a bipolar cautery.  The mesosalpinx was incised freeing the tube. Then the right uterine ovarian pedicle was serially clamped cauterized and incised. Next the right round ligament was serially clamped cauterized and incised. The anterior posterior peritoneum of the inferiorly for the broad ligament were opened. The anterior peritoneum was carried across to the dissection on the left side. There were adhesions from prior cesarean section so care was taken to dissect the adhesions and note location of the bladder.  In the midportion of the dissection, it was more difficult to see the bladder edge so the bladder was back-filled with sterile saline so the edge could be more easily.  My dissection was well away from the bladder so the dissection was continued and the bladder drained of saline.  The remainder of the bladder flap was created using sharp dissection. The bladder was well below the level of the KOH ring. The left uterine artery skeletonized. Then the left uterine artery, above the level of the KOH ring, was serially clamped cauterized  and incised. The uterus was devascularized at this point.  The colpotomy was performed a starting in the midline and using a harmonic scalpel with the inferior edge of the open blade  This was carried around a circumferential fashion until the vaginal mucosa was completely incised in the specimen was freed.  The specimen was then delivered to the vagina.  A vaginal occlusive device was used to maintain the pneumoperitoneum  Instruments were changed with a needle driver and Kobra graspers.  Using a 9 inch V. lock suture, the cuff was closed by incorporating the anterior and posterior vaginal mucosa in each stitch. This was carried across all the way to the left corner and a running fashion. Two stitches were brought back towards the midline and the suture was cut flush with the vagina. The needle was brought out the pelvis. The pelvis was irrigated. All pedicles were inspected. No bleeding was noted.   Co2 pressures were lowered to 54mm 63m  Again, no bleeding was noted.  Ureters were noted deep in the pelvis to be peristalsing.  Arista was placed along the pedicles.  At this point the procedure was completed.  The remaining instruments were removed.  The ports (except the suprapubic port) were removed under  direct visualization of the laparoscope and the pneumoperitoneum was relieved.  The patient was taken out of Trendelenburg positioning.  Several deep breaths were given to the patient's trying to any gas the abdomen and finally the suprapubic port was removed.  The skin was then closed with subcuticular stitches of 3-0 Vicryl. The skin was cleansed Dermabond was applied. Attention was then turned the vagina and the cuff was inspected. No bleeding was noted. The anterior posterior vaginal mucosa was incorporated in each stitch. The Foley catheter was removed.  Cystoscopy was performed.  No sutures or bladder injuries were noted.  Ureters were noted with normal urine jets from each one was seen.  Foley was  left out after the cystoscopic fluid was drained and cystoscope removed.  Sponge, lap, needle, initially counts were correct x2. Patient tolerated the procedure very well. She was awakened from anesthesia, extubated and taken to recovery in stable condition.   COUNTS:  YES  PLAN OF CARE: Transfer to PACU

## 2021-10-30 NOTE — Progress Notes (Signed)
Day of Surgery Procedure(s) (LRB): TOTAL LAPAROSCOPIC HYSTERECTOMY WITH SALPINGECTOMY (Bilateral) CYSTOSCOPY (N/A)  Subjective: Patient reports no complaints except mild pain.  No nausea.  Has eaten, voided, and walked.    Objective: I have reviewed patient's vital signs, intake and output, medications, and labs.  General: alert and no distress Resp: clear to auscultation bilaterally Cardio: regular rate and rhythm, S1, S2 normal, no murmur, click, rub or gallop GI: soft, non-tender; bowel sounds normal; no masses,  no organomegaly Extremities: extremities normal, atraumatic, no cyanosis or edema Vaginal Bleeding: none Inc:  C/D/I  Assessment: s/p Procedure(s): TOTAL LAPAROSCOPIC HYSTERECTOMY WITH SALPINGECTOMY (Bilateral) CYSTOSCOPY (N/A): progressing well  Plan: Hb pending.  If reasonable from post op stand point, will consider discharge.  LOS: 0 days    Megan Salon, MD 10/30/2021, 5:19 PM

## 2021-10-30 NOTE — Anesthesia Postprocedure Evaluation (Signed)
Anesthesia Post Note  Patient: Caitlyn Richardson  Procedure(s) Performed: TOTAL LAPAROSCOPIC HYSTERECTOMY WITH SALPINGECTOMY (Bilateral: Abdomen) CYSTOSCOPY (Bladder)     Patient location during evaluation: PACU Anesthesia Type: General Level of consciousness: awake Pain management: pain level controlled Vital Signs Assessment: post-procedure vital signs reviewed and stable Respiratory status: spontaneous breathing and respiratory function stable Cardiovascular status: stable Postop Assessment: no apparent nausea or vomiting Anesthetic complications: no   No notable events documented.  Last Vitals:  Vitals:   10/30/21 1637 10/30/21 1757  BP: 128/68 124/68  Pulse: 72 71  Resp: 15 14  Temp: 36.9 C 36.8 C  SpO2: 98% 96%    Last Pain:  Vitals:   10/30/21 1757  TempSrc:   PainSc: 3                  Candra R Masayo Fera

## 2021-10-30 NOTE — Anesthesia Preprocedure Evaluation (Addendum)
Anesthesia Evaluation  Patient identified by MRN, date of birth, ID band Patient awake    Reviewed: Allergy & Precautions, NPO status , Patient's Chart, lab work & pertinent test results  Airway Mallampati: II  TM Distance: >3 FB Neck ROM: Full    Dental no notable dental hx.    Pulmonary neg pulmonary ROS,    Pulmonary exam normal breath sounds clear to auscultation       Cardiovascular hypertension, Pt. on medications negative cardio ROS Normal cardiovascular exam Rhythm:Regular Rate:Normal     Neuro/Psych  Headaches, PSYCHIATRIC DISORDERS Anxiety Depression  Neuromuscular disease (lumbar radiculopathy; left side sciatica)    GI/Hepatic negative GI ROS, Neg liver ROS,   Endo/Other  negative endocrine ROShyperlipidemia  Renal/GU negative Renal ROS  negative genitourinary   Musculoskeletal  (+) Arthritis , Osteoarthritis,    Abdominal   Peds negative pediatric ROS (+)  Hematology negative hematology ROS (+)   Anesthesia Other Findings   Reproductive/Obstetrics Fibroids menorrhagia                            Anesthesia Physical Anesthesia Plan  ASA: 2  Anesthesia Plan: General   Post-op Pain Management: Minimal or no pain anticipated   Induction: Intravenous  PONV Risk Score and Plan: 3 and Scopolamine patch - Pre-op, Treatment may vary due to age or medical condition, Midazolam, Dexamethasone and Ondansetron  Airway Management Planned: Oral ETT  Additional Equipment: None  Intra-op Plan:   Post-operative Plan: Extubation in OR  Informed Consent: I have reviewed the patients History and Physical, chart, labs and discussed the procedure including the risks, benefits and alternatives for the proposed anesthesia with the patient or authorized representative who has indicated his/her understanding and acceptance.       Plan Discussed with: Anesthesiologist, CRNA and  Surgeon  Anesthesia Plan Comments:        Anesthesia Quick Evaluation

## 2021-10-31 ENCOUNTER — Encounter (HOSPITAL_BASED_OUTPATIENT_CLINIC_OR_DEPARTMENT_OTHER): Payer: Self-pay | Admitting: Obstetrics & Gynecology

## 2021-10-31 NOTE — Telephone Encounter (Signed)
Called pt in response to [employer]. Pt states that she is having some bloody drainage coming from her incision. She feels that it is due to the her being up moving around more. It is not an active bleed. Advised pt to have someone go to the store and get some gauze and medical tape to form a pressure bandage on the areas. Advised if the bleeding soaked through those bandages to let us know and to provide [place] with an update in the morning regardless. Pt verbalized understanding.

## 2021-11-01 ENCOUNTER — Encounter (HOSPITAL_BASED_OUTPATIENT_CLINIC_OR_DEPARTMENT_OTHER): Payer: Self-pay | Admitting: Nurse Practitioner

## 2021-11-01 ENCOUNTER — Encounter (HOSPITAL_BASED_OUTPATIENT_CLINIC_OR_DEPARTMENT_OTHER): Payer: Self-pay | Admitting: Obstetrics & Gynecology

## 2021-11-01 DIAGNOSIS — I10 Essential (primary) hypertension: Secondary | ICD-10-CM

## 2021-11-01 LAB — SURGICAL PATHOLOGY

## 2021-11-04 ENCOUNTER — Encounter (HOSPITAL_BASED_OUTPATIENT_CLINIC_OR_DEPARTMENT_OTHER): Payer: Self-pay | Admitting: Obstetrics & Gynecology

## 2021-11-04 ENCOUNTER — Ambulatory Visit (INDEPENDENT_AMBULATORY_CARE_PROVIDER_SITE_OTHER): Payer: 59 | Admitting: Obstetrics & Gynecology

## 2021-11-04 VITALS — BP 135/69 | HR 58 | Wt 187.6 lb

## 2021-11-04 DIAGNOSIS — R1030 Lower abdominal pain, unspecified: Secondary | ICD-10-CM | POA: Diagnosis not present

## 2021-11-04 DIAGNOSIS — Z9889 Other specified postprocedural states: Secondary | ICD-10-CM

## 2021-11-06 ENCOUNTER — Encounter (HOSPITAL_BASED_OUTPATIENT_CLINIC_OR_DEPARTMENT_OTHER): Payer: 59 | Admitting: Obstetrics & Gynecology

## 2021-11-06 LAB — URINE CULTURE

## 2021-11-06 NOTE — Progress Notes (Signed)
GYNECOLOGY  VISIT  CC:   post op recheck  HPI: 41 y.o. [name] Single Black or African American female here for recheck after undergoing TLH/bilateral salpingectomy/cystoscopy on 10/30/2021.  She reports bleeding is none.  She has minimal pain.  Not taking narcotics at this time.  Still using Motrin.  Bowel function is  improving.  Has had some constipation but did have bowel movement yesterday.  Taking stool softeners .  Bladder function is normal.  Did have some stinging post op so has taken some Azo and this has helped.  Does have some concerns about incisions as she has seen some blood around the skin glue.  Pathology reviewed:  Yes .  Questions answered.    MEDS:   Current Outpatient Medications on File Prior to Visit  Medication Sig Dispense Refill   amLODipine (NORVASC) 5 MG tablet Take 1 tablet  by mouth daily. 90 tablet 3   escitalopram (LEXAPRO) 10 MG tablet Take 2 tablets (20 mg total) by mouth at bedtime. 180 tablet 3   hydrOXYzine (VISTARIL) 25 MG capsule Take 1 capsule by mouth at bedtime and may repeat dose one time if needed. 180 capsule 3   ibuprofen (ADVIL) 800 MG tablet Take 1 tablet (800 mg total) by mouth every 8 (eight) hours as needed. 30 tablet 0   loteprednol (LOTEMAX) 0.5 % ophthalmic suspension 1 drop daily as needed. For allergies     meloxicam (MOBIC) 15 MG tablet Take 1 tablet by mouth daily. 30 tablet 0   olmesartan-hydrochlorothiazide (BENICAR HCT) 40-25 MG tablet Take 1 tablet by mouth daily. 90 tablet 3   No current facility-administered medications on file prior to visit.    SH:  Smoking No    PHYSICAL EXAMINATION:    BP 135/69 (BP Location: Right Arm, Patient Position: Sitting, Cuff Size: Large)   Pulse (!) 58   Wt 187 lb 9.6 oz (85.1 kg)   LMP 09/22/2021 (Approximate)   BMI 30.28 kg/m     General appearance: alert, cooperative and appears stated age Abdomen: soft, non-tender; bowel sounds normal; no masses,  no organomegaly Incisions:   C/D/I  Pelvic: deferred today  Assessment/Plan: 1. Post-operative state - recheck 4 weeks  2. Lower abdominal pain - Urine Culture

## 2021-11-08 ENCOUNTER — Inpatient Hospital Stay (HOSPITAL_BASED_OUTPATIENT_CLINIC_OR_DEPARTMENT_OTHER): Payer: 59 | Admitting: Nurse Practitioner

## 2021-11-08 ENCOUNTER — Encounter: Payer: Self-pay | Admitting: Nurse Practitioner

## 2021-11-08 ENCOUNTER — Inpatient Hospital Stay: Payer: 59 | Attending: Oncology

## 2021-11-08 VITALS — BP 132/75 | HR 68 | Temp 98.2°F | Resp 18 | Ht 66.0 in | Wt 185.4 lb

## 2021-11-08 DIAGNOSIS — I10 Essential (primary) hypertension: Secondary | ICD-10-CM | POA: Insufficient documentation

## 2021-11-08 DIAGNOSIS — N920 Excessive and frequent menstruation with regular cycle: Secondary | ICD-10-CM | POA: Diagnosis not present

## 2021-11-08 DIAGNOSIS — Z86018 Personal history of other benign neoplasm: Secondary | ICD-10-CM | POA: Diagnosis not present

## 2021-11-08 DIAGNOSIS — D509 Iron deficiency anemia, unspecified: Secondary | ICD-10-CM

## 2021-11-08 LAB — CBC WITH DIFFERENTIAL (CANCER CENTER ONLY)
Abs Immature Granulocytes: 0.02 10*3/uL (ref 0.00–0.07)
Basophils Absolute: 0 10*3/uL (ref 0.0–0.1)
Basophils Relative: 0 %
Eosinophils Absolute: 0.1 10*3/uL (ref 0.0–0.5)
Eosinophils Relative: 1 %
HCT: 35.5 % — ABNORMAL LOW (ref 36.0–46.0)
Hemoglobin: 12.1 g/dL (ref 12.0–15.0)
Immature Granulocytes: 0 %
Lymphocytes Relative: 16 %
Lymphs Abs: 1.4 10*3/uL (ref 0.7–4.0)
MCH: 29.1 pg (ref 26.0–34.0)
MCHC: 34.1 g/dL (ref 30.0–36.0)
MCV: 85.3 fL (ref 80.0–100.0)
Monocytes Absolute: 0.4 10*3/uL (ref 0.1–1.0)
Monocytes Relative: 5 %
Neutro Abs: 7.2 10*3/uL (ref 1.7–7.7)
Neutrophils Relative %: 78 %
Platelet Count: 391 10*3/uL (ref 150–400)
RBC: 4.16 MIL/uL (ref 3.87–5.11)
RDW: 12.9 % (ref 11.5–15.5)
WBC Count: 9.2 10*3/uL (ref 4.0–10.5)
nRBC: 0 % (ref 0.0–0.2)

## 2021-11-08 LAB — FERRITIN: Ferritin: 37 ng/mL (ref 11–307)

## 2021-11-08 NOTE — Progress Notes (Signed)
  [place] OFFICE PROGRESS NOTE   Diagnosis: Iron deficiency anemia  INTERVAL HISTORY:   Caitlyn Richardson returns as scheduled.  She underwent a laparoscopic hysterectomy by Dr. Sabra Heck on 10/30/2021.  She is feeling well overall.  No abdominal pain.  No bleeding.  She discontinued oral iron about a week ago.  Objective:  Vital signs in last 24 hours:  Blood pressure 132/75, pulse 68, temperature 98.2 F (36.8 C), temperature source Oral, resp. rate 18, height '5\' 6"'$  (1.676 m), weight 185 lb 6.4 oz (84.1 kg), last menstrual period 09/22/2021, SpO2 100 %.    Resp: Lungs clear bilaterally. Cardio: Regular rate and rhythm. GI: Abdomen soft and nontender.  No hepatosplenomegaly.  Small lower abdominal surgical incisions are healing. Vascular: No leg edema.   Lab Results:  Lab Results  Component Value Date   WBC 9.2 11/08/2021   HGB 12.1 11/08/2021   HCT 35.5 (L) 11/08/2021   MCV 85.3 11/08/2021   PLT 391 11/08/2021   NEUTROABS 7.2 11/08/2021    Imaging:  No results found.  Medications: I have reviewed the patient's current medications.  Assessment/Plan: Iron deficiency anemia likely related to menstrual blood loss 10/09/2020 hemoglobin 8.3, MCV 73  10/31/2020 ferritin 3 Ferrous sulfate 325 mg 2-3 times daily 11/02/2020 Urinalysis negative for blood 11/02/2020 Stool cards negative for occult blood x3 12/10/2020 12/18/2020 hemoglobin 11.5  Ferrous sulfate 325 mg twice daily continued, changed to once daily dosing October 2022 11/08/2021 oral iron discontinued approximately 1 week ago 11/08/2021 hemoglobin 12.1, MCV 85   Menorrhagia, history of uterine fibroids, status post myomectomy Pelvic ultrasound 02/20/2021-multiple uterine fibroids Laparoscopic hysterectomy 10/30/2021 Hypertension    Disposition: Caitlyn Richardson appears stable.  She has a history of iron deficiency anemia felt to likely be related to menstrual blood loss.  She recently underwent a laparoscopic  hysterectomy.  The hemoglobin is now in low normal range.  She has discontinued oral iron.  We will follow-up on the ferritin from today.  Hemoglobin will hopefully continue to improve.  We will see her back in 3 to 4 months with the plan for further follow-up on an as-needed basis if the hemoglobin remains in normal range.    Ned Card ANP/GNP-BC   11/08/2021  2:34 PM

## 2021-11-10 ENCOUNTER — Other Ambulatory Visit (HOSPITAL_BASED_OUTPATIENT_CLINIC_OR_DEPARTMENT_OTHER): Payer: Self-pay

## 2021-11-12 MED ORDER — OLMESARTAN MEDOXOMIL-HCTZ 40-25 MG PO TABS: 1.0000 | ORAL_TABLET | Freq: Every day | ORAL | 3 refills | Status: DC

## 2021-11-12 MED ORDER — AMLODIPINE BESYLATE 5 MG PO TABS: 5.0000 mg | ORAL_TABLET | Freq: Every day | ORAL | 3 refills | Status: DC

## 2021-11-12 NOTE — Telephone Encounter (Signed)
Refills have been sent to [facility] in [place].

## 2021-11-20 ENCOUNTER — Encounter (HOSPITAL_BASED_OUTPATIENT_CLINIC_OR_DEPARTMENT_OTHER): Payer: Self-pay | Admitting: Nurse Practitioner

## 2021-11-20 ENCOUNTER — Ambulatory Visit (INDEPENDENT_AMBULATORY_CARE_PROVIDER_SITE_OTHER): Payer: 59 | Admitting: Nurse Practitioner

## 2021-11-20 VITALS — BP 117/80 | HR 65 | Ht 66.0 in | Wt 186.2 lb

## 2021-11-20 DIAGNOSIS — F339 Major depressive disorder, recurrent, unspecified: Secondary | ICD-10-CM

## 2021-11-20 DIAGNOSIS — F413 Other mixed anxiety disorders: Secondary | ICD-10-CM

## 2021-11-20 DIAGNOSIS — E559 Vitamin D deficiency, unspecified: Secondary | ICD-10-CM | POA: Diagnosis not present

## 2021-11-20 DIAGNOSIS — Z0000 Encounter for general adult medical examination without abnormal findings: Secondary | ICD-10-CM | POA: Diagnosis not present

## 2021-11-20 DIAGNOSIS — E876 Hypokalemia: Secondary | ICD-10-CM | POA: Diagnosis not present

## 2021-11-20 DIAGNOSIS — F5102 Adjustment insomnia: Secondary | ICD-10-CM

## 2021-11-20 DIAGNOSIS — S62354A Nondisplaced fracture of shaft of fourth metacarpal bone, right hand, initial encounter for closed fracture: Secondary | ICD-10-CM

## 2021-11-20 DIAGNOSIS — I10 Essential (primary) hypertension: Secondary | ICD-10-CM

## 2021-11-20 DIAGNOSIS — R221 Localized swelling, mass and lump, neck: Secondary | ICD-10-CM | POA: Diagnosis not present

## 2021-11-20 MED ORDER — ALPRAZOLAM 0.5 MG PO TABS: 0.2500 mg | ORAL_TABLET | Freq: Every evening | ORAL | 0 refills | Status: DC | PRN

## 2021-11-20 MED ORDER — VENLAFAXINE HCL ER 75 MG PO CP24
75.0000 mg | ORAL_CAPSULE | Freq: Every day | ORAL | 1 refills | Status: DC
Start: 2021-11-20 — End: 2021-12-18

## 2021-11-20 MED ORDER — VENLAFAXINE HCL ER 37.5 MG PO CP24: ORAL_CAPSULE | ORAL | 0 refills | Status: DC

## 2021-11-20 MED ORDER — DICLOFENAC SODIUM 1 % EX GEL
2.0000 g | Freq: Four times a day (QID) | CUTANEOUS | 5 refills | Status: DC
Start: 2021-11-20 — End: 2021-12-20

## 2021-11-20 NOTE — Assessment & Plan Note (Addendum)
Patient endorses the presence of a nodularity on the left side of her neck in the submandibular region.  Evaluation does show presence of an enlargement in this area.  Unclear if this is an enlarged lymph node or possibly the submandibular salivary gland. She feels like the area has been getting larger since she initially noticed it and it does feel tender to the touch at this time.  Given her symptoms and concern we will go ahead and perform an ultrasound for further evaluation.  We will also obtain labs today.  She has not had any recent upper respiratory infections that could be contributing or causing this symptom.

## 2021-11-20 NOTE — Assessment & Plan Note (Signed)
Significant stress exacerbation resulting in insomnia.  I do feel that her insomnia is worsening her mental health concerns.  She has tried hydroxyzine however this was unsuccessful for her.  Given the severity of her symptoms at this time I do feel that it is appropriate to trial a short-term treatment plan with benzodiazepines to help manage the anxiety and allow her to relax and fall asleep at night.  Patient instructed on proper medication use.  We will plan toFollow-up in the next couple of weeks to see how she is doing.  If any new or worsening symptoms present she will follow-up sooner.

## 2021-11-20 NOTE — Assessment & Plan Note (Signed)
Chronic.  Blood pressure well controlled at this time.  Recommend continuation of current medication therapy.  No alarm symptoms present at this time.  Labs today for monitoring.  Plan for follow-up in 6 months or sooner if needed.
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
## 2021-11-20 NOTE — Assessment & Plan Note (Signed)
Recurrent depressive symptoms present at this time with no improvement with recent adjustment of escitalopram dosage.  Given that the patient has had no effective improvement with this treatment we did discuss the option of considering adjunctive therapy with bupropion versus changing medication completely to try a different formulation such as venlafaxine.  Given the patient's anxiety symptoms that are also present we will go ahead and transition to the venlafaxine to avoid potential exacerbation that can occur with bupropion.  Advised on taper off of escitalopram and taper onto venlafaxine with a cross taper to avoid.  Without medication given the significance of her symptoms at this time.  She is not experiencing any alarm symptoms today however I am concerned with the presentation of her mood and affect.  She is currently seeing counseling every other week.  Encouraged her to reach out to see if those sessions can be increased and if so I do recommend that.  If sessions cannot be increased we will send referral for secondary counseling services.

## 2021-11-20 NOTE — Patient Instructions (Signed)
I would like you to start on the effexor 37.'5mg'$  in the morning and at the same time cut down the dose of escitalopram to '10mg'$ . After 1 week, increase effexor to '75mg'$  and stop the escitalopram.   If you have ANY side effects please send me a message. I will be out of town, but will be checking my messages.   I want to chat with you and see how you are doing in 3 weeks.

## 2021-11-20 NOTE — Assessment & Plan Note (Signed)
Chronic.  Will obtain labs today.  We will make changes to plan of care based on findings as appropriate.

## 2021-11-20 NOTE — Assessment & Plan Note (Signed)
Range of motion has improved however she is still experiencing tenderness and pain to the hand with evidence of stiffness in the joint consistent with arthritis.  Recommendation for Voltaren topically to help with symptom management.

## 2021-11-21 ENCOUNTER — Encounter (HOSPITAL_BASED_OUTPATIENT_CLINIC_OR_DEPARTMENT_OTHER): Payer: Self-pay | Admitting: Nurse Practitioner

## 2021-11-21 LAB — TSH: TSH: 3.69 u[IU]/mL (ref 0.450–4.500)

## 2021-11-21 LAB — COMPREHENSIVE METABOLIC PANEL
ALT: 12 IU/L (ref 0–32)
AST: 13 IU/L (ref 0–40)
Albumin/Globulin Ratio: 1.6 (ref 1.2–2.2)
Albumin: 4.6 g/dL (ref 3.9–4.9)
Alkaline Phosphatase: 98 IU/L (ref 44–121)
BUN/Creatinine Ratio: 14 (ref 9–23)
BUN: 12 mg/dL (ref 6–24)
Bilirubin Total: 0.8 mg/dL (ref 0.0–1.2)
CO2: 24 mmol/L (ref 20–29)
Calcium: 9.9 mg/dL (ref 8.7–10.2)
Chloride: 101 mmol/L (ref 96–106)
Creatinine, Ser: 0.84 mg/dL (ref 0.57–1.00)
Globulin, Total: 2.8 g/dL (ref 1.5–4.5)
Glucose: 92 mg/dL (ref 70–99)
Potassium: 3.4 mmol/L — ABNORMAL LOW (ref 3.5–5.2)
Sodium: 140 mmol/L (ref 134–144)
Total Protein: 7.4 g/dL (ref 6.0–8.5)
eGFR: 89 mL/min/{1.73_m2} (ref >59)

## 2021-11-21 LAB — CBC WITH DIFFERENTIAL/PLATELET
Basophils Absolute: 0 10*3/uL (ref 0.0–0.2)
Basos: 0 %
EOS (ABSOLUTE): 0.1 10*3/uL (ref 0.0–0.4)
Eos: 1 %
Hematocrit: 36.3 % (ref 34.0–46.6)
Hemoglobin: 12.3 g/dL (ref 11.1–15.9)
Immature Grans (Abs): 0 10*3/uL (ref 0.0–0.1)
Immature Granulocytes: 0 %
Lymphocytes Absolute: 1.6 10*3/uL (ref 0.7–3.1)
Lymphs: 18 %
MCH: 29.1 pg (ref 26.6–33.0)
MCHC: 33.9 g/dL (ref 31.5–35.7)
MCV: 86 fL (ref 79–97)
Monocytes Absolute: 0.6 10*3/uL (ref 0.1–0.9)
Monocytes: 7 %
Neutrophils Absolute: 6.6 10*3/uL (ref 1.4–7.0)
Neutrophils: 74 %
Platelets: 338 10*3/uL (ref 150–450)
RBC: 4.23 x10E6/uL (ref 3.77–5.28)
RDW: 13.1 % (ref 11.7–15.4)
WBC: 9 10*3/uL (ref 3.4–10.8)

## 2021-11-21 LAB — VITAMIN D 25 HYDROXY (VIT D DEFICIENCY, FRACTURES): Vit D, 25-Hydroxy: 20.3 ng/mL — ABNORMAL LOW (ref 30.0–100.0)

## 2021-11-21 LAB — HEMOGLOBIN A1C
Est. average glucose Bld gHb Est-mCnc: 111 mg/dL
Hgb A1c MFr Bld: 5.5 % (ref 4.8–5.6)

## 2021-11-21 LAB — LIPID PANEL
Chol/HDL Ratio: 6.9 ratio — ABNORMAL HIGH (ref 0.0–4.4)
Cholesterol, Total: 228 mg/dL — ABNORMAL HIGH (ref 100–199)
HDL: 33 mg/dL — ABNORMAL LOW (ref >39)
LDL Chol Calc (NIH): 167 mg/dL — ABNORMAL HIGH (ref 0–99)
Triglycerides: 153 mg/dL — ABNORMAL HIGH (ref 0–149)
VLDL Cholesterol Cal: 28 mg/dL (ref 5–40)

## 2021-11-22 ENCOUNTER — Encounter (HOSPITAL_BASED_OUTPATIENT_CLINIC_OR_DEPARTMENT_OTHER): Payer: Self-pay | Admitting: Nurse Practitioner

## 2021-11-22 ENCOUNTER — Ambulatory Visit
Admission: RE | Admit: 2021-11-22 | Discharge: 2021-11-22 | Disposition: A | Discharge status: Home / Self Care | Payer: 59 | Source: Ambulatory Visit | Attending: Nurse Practitioner | Admitting: Nurse Practitioner

## 2021-11-22 DIAGNOSIS — R220 Localized swelling, mass and lump, head: Secondary | ICD-10-CM | POA: Diagnosis not present

## 2021-11-22 DIAGNOSIS — R221 Localized swelling, mass and lump, neck: Secondary | ICD-10-CM

## 2021-11-22 MED ORDER — VITAMIN D (ERGOCALCIFEROL) 1.25 MG (50000 UNIT) PO CAPS: 50000.0000 [IU] | ORAL_CAPSULE | ORAL | 0 refills | Status: DC

## 2021-11-22 MED ORDER — POTASSIUM CHLORIDE CRYS ER 20 MEQ PO TBCR: 20.0000 meq | EXTENDED_RELEASE_TABLET | Freq: Every day | ORAL | 2 refills | Status: DC

## 2021-11-22 NOTE — Addendum Note (Signed)
Addended by: Deem Marmol, Clarise Cruz E on: 11/22/2021 05:22 PM   Modules accepted: Orders

## 2021-11-25 ENCOUNTER — Other Ambulatory Visit (HOSPITAL_COMMUNITY): Payer: Self-pay

## 2021-11-25 ENCOUNTER — Other Ambulatory Visit (HOSPITAL_BASED_OUTPATIENT_CLINIC_OR_DEPARTMENT_OTHER): Payer: Self-pay

## 2021-11-25 DIAGNOSIS — E559 Vitamin D deficiency, unspecified: Secondary | ICD-10-CM

## 2021-11-25 MED ORDER — VITAMIN D (ERGOCALCIFEROL) 1.25 MG (50000 UNIT) PO CAPS
50000.0000 [IU] | ORAL_CAPSULE | ORAL | 0 refills | Status: DC
  Filled 2021-11-25 – 2021-11-27 (×2): qty 12, 84d supply, fill #0

## 2021-11-27 ENCOUNTER — Other Ambulatory Visit (HOSPITAL_COMMUNITY): Payer: Self-pay

## 2021-11-28 ENCOUNTER — Encounter (HOSPITAL_BASED_OUTPATIENT_CLINIC_OR_DEPARTMENT_OTHER): Payer: Self-pay | Admitting: Obstetrics & Gynecology

## 2021-11-28 ENCOUNTER — Ambulatory Visit (INDEPENDENT_AMBULATORY_CARE_PROVIDER_SITE_OTHER): Payer: 59 | Admitting: Obstetrics & Gynecology

## 2021-11-28 VITALS — BP 116/73 | HR 55 | Ht 66.5 in | Wt 188.0 lb

## 2021-11-28 DIAGNOSIS — Z9889 Other specified postprocedural states: Secondary | ICD-10-CM

## 2021-11-28 DIAGNOSIS — Z1231 Encounter for screening mammogram for malignant neoplasm of breast: Secondary | ICD-10-CM
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
## 2021-11-29 ENCOUNTER — Encounter (HOSPITAL_BASED_OUTPATIENT_CLINIC_OR_DEPARTMENT_OTHER): Payer: Self-pay | Admitting: Obstetrics & Gynecology

## 2021-12-02 ENCOUNTER — Encounter (HOSPITAL_BASED_OUTPATIENT_CLINIC_OR_DEPARTMENT_OTHER): Payer: Self-pay | Admitting: Obstetrics & Gynecology

## 2021-12-11 ENCOUNTER — Encounter (HOSPITAL_BASED_OUTPATIENT_CLINIC_OR_DEPARTMENT_OTHER): Payer: Self-pay | Admitting: Nurse Practitioner

## 2021-12-11 ENCOUNTER — Other Ambulatory Visit (HOSPITAL_COMMUNITY): Payer: Self-pay

## 2021-12-11 ENCOUNTER — Ambulatory Visit (INDEPENDENT_AMBULATORY_CARE_PROVIDER_SITE_OTHER): Payer: 59 | Admitting: Nurse Practitioner

## 2021-12-11 DIAGNOSIS — F339 Major depressive disorder, recurrent, unspecified: Secondary | ICD-10-CM | POA: Diagnosis not present

## 2021-12-11 DIAGNOSIS — M47817 Spondylosis without myelopathy or radiculopathy, lumbosacral region: Secondary | ICD-10-CM

## 2021-12-11 DIAGNOSIS — R202 Paresthesia of skin: Secondary | ICD-10-CM

## 2021-12-11 DIAGNOSIS — E876 Hypokalemia: Secondary | ICD-10-CM | POA: Diagnosis not present

## 2021-12-11 MED ORDER — IBUPROFEN 800 MG PO TABS
800.0000 mg | ORAL_TABLET | Freq: Three times a day (TID) | ORAL | 3 refills | Status: DC | PRN
  Filled 2021-12-11: qty 90, 30d supply, fill #0

## 2021-12-11 MED ORDER — POTASSIUM CHLORIDE CRYS ER 20 MEQ PO TBCR
20.0000 meq | EXTENDED_RELEASE_TABLET | Freq: Every day | ORAL | 2 refills | Status: DC
  Filled 2021-12-11: qty 30, 30d supply, fill #0
  Filled 2022-03-15: qty 30, 30d supply, fill #1
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
## 2021-12-17 ENCOUNTER — Other Ambulatory Visit (HOSPITAL_BASED_OUTPATIENT_CLINIC_OR_DEPARTMENT_OTHER): Payer: Self-pay | Admitting: Nurse Practitioner

## 2021-12-17 DIAGNOSIS — F5102 Adjustment insomnia: Secondary | ICD-10-CM

## 2021-12-17 DIAGNOSIS — F339 Major depressive disorder, recurrent, unspecified: Secondary | ICD-10-CM

## 2021-12-17 DIAGNOSIS — F413 Other mixed anxiety disorders: Secondary | ICD-10-CM

## 2021-12-19 ENCOUNTER — Encounter (HOSPITAL_BASED_OUTPATIENT_CLINIC_OR_DEPARTMENT_OTHER): Payer: Self-pay | Admitting: Nurse Practitioner

## 2021-12-19 DIAGNOSIS — S62354A Nondisplaced fracture of shaft of fourth metacarpal bone, right hand, initial encounter for closed fracture: Secondary | ICD-10-CM

## 2021-12-19 DIAGNOSIS — F339 Major depressive disorder, recurrent, unspecified: Secondary | ICD-10-CM

## 2021-12-19 DIAGNOSIS — I10 Essential (primary) hypertension: Secondary | ICD-10-CM

## 2021-12-19 DIAGNOSIS — F413 Other mixed anxiety disorders: Secondary | ICD-10-CM

## 2021-12-19 DIAGNOSIS — F5102 Adjustment insomnia: Secondary | ICD-10-CM

## 2021-12-20 ENCOUNTER — Other Ambulatory Visit (HOSPITAL_COMMUNITY): Payer: Self-pay

## 2021-12-20 MED ORDER — ALPRAZOLAM 0.5 MG PO TABS
0.2500 mg | ORAL_TABLET | Freq: Every evening | ORAL | 2 refills | Status: DC | PRN
  Filled 2021-12-20: qty 20, 20d supply, fill #0
  Filled 2022-05-18: qty 20, 20d supply, fill #1

## 2021-12-20 MED ORDER — DICLOFENAC SODIUM 1 % EX GEL
2.0000 g | Freq: Four times a day (QID) | CUTANEOUS | 5 refills | Status: DC
  Filled 2021-12-20: qty 100, 12d supply, fill #0

## 2021-12-20 MED ORDER — AMLODIPINE BESYLATE 5 MG PO TABS
5.0000 mg | ORAL_TABLET | Freq: Every day | ORAL | 3 refills | Status: DC
  Filled 2021-12-20: qty 90, 90d supply, fill #0
  Filled 2022-03-15: qty 90, 90d supply, fill #1
  Filled 2022-06-20: qty 90, 90d supply, fill #2
  Filled 2022-11-15 (×2): qty 90, 90d supply, fill #3

## 2021-12-20 MED ORDER — OLMESARTAN MEDOXOMIL-HCTZ 40-25 MG PO TABS
1.0000 | ORAL_TABLET | Freq: Every day | ORAL | 3 refills | Status: DC
  Filled 2021-12-20: qty 90, 90d supply, fill #0
  Filled 2022-03-15: qty 90, 90d supply, fill #1
  Filled 2022-06-20: qty 90, 90d supply, fill #2
  Filled 2022-11-15 (×2): qty 90, 90d supply, fill #3

## 2021-12-20 MED ORDER — VENLAFAXINE HCL ER 75 MG PO CP24
75.0000 mg | ORAL_CAPSULE | Freq: Every day | ORAL | 3 refills | Status: DC
  Filled 2021-12-20: qty 90, 90d supply, fill #0

## 2021-12-20 NOTE — Addendum Note (Signed)
Addended by: Tatjana Turcott, Clarise Cruz E on: 12/20/2021 02:39 PM   Modules accepted: Orders

## 2021-12-21 ENCOUNTER — Ambulatory Visit (HOSPITAL_BASED_OUTPATIENT_CLINIC_OR_DEPARTMENT_OTHER): Admission: RE | Admit: 2021-12-21 | Payer: 59 | Source: Ambulatory Visit | Admitting: Radiology

## 2021-12-25 ENCOUNTER — Encounter (HOSPITAL_BASED_OUTPATIENT_CLINIC_OR_DEPARTMENT_OTHER): Payer: Self-pay | Admitting: Nurse Practitioner

## 2021-12-25 DIAGNOSIS — F339 Major depressive disorder, recurrent, unspecified: Secondary | ICD-10-CM

## 2021-12-25 DIAGNOSIS — F413 Other mixed anxiety disorders: Secondary | ICD-10-CM

## 2021-12-26 ENCOUNTER — Encounter (HOSPITAL_BASED_OUTPATIENT_CLINIC_OR_DEPARTMENT_OTHER): Payer: Self-pay | Admitting: Obstetrics & Gynecology

## 2021-12-27 ENCOUNTER — Other Ambulatory Visit (HOSPITAL_COMMUNITY)
Admission: RE | Admit: 2021-12-27 | Discharge: 2021-12-27 | Disposition: A | Discharge status: Home / Self Care | Payer: 59 | Source: Ambulatory Visit | Attending: Obstetrics & Gynecology | Admitting: Obstetrics & Gynecology

## 2021-12-27 ENCOUNTER — Ambulatory Visit (HOSPITAL_BASED_OUTPATIENT_CLINIC_OR_DEPARTMENT_OTHER): Payer: 59

## 2021-12-27 DIAGNOSIS — N898 Other specified noninflammatory disorders of vagina: Secondary | ICD-10-CM
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
## 2021-12-28 ENCOUNTER — Ambulatory Visit (HOSPITAL_BASED_OUTPATIENT_CLINIC_OR_DEPARTMENT_OTHER)
Admission: RE | Admit: 2021-12-28 | Discharge: 2021-12-28 | Disposition: A | Discharge status: Home / Self Care | Payer: 59 | Source: Ambulatory Visit | Attending: Obstetrics & Gynecology | Admitting: Obstetrics & Gynecology

## 2021-12-28 DIAGNOSIS — Z1231 Encounter for screening mammogram for malignant neoplasm of breast: Secondary | ICD-10-CM | POA: Diagnosis not present

## 2022-01-01 ENCOUNTER — Other Ambulatory Visit: Payer: Self-pay | Admitting: Obstetrics & Gynecology

## 2022-01-01 DIAGNOSIS — R928 Other abnormal and inconclusive findings on diagnostic imaging of breast: Secondary | ICD-10-CM

## 2022-01-01 LAB — CERVICOVAGINAL ANCILLARY ONLY
Bacterial Vaginitis (gardnerella): POSITIVE — AB
Candida Glabrata: NEGATIVE
Candida Vaginitis: NEGATIVE
Comment: NEGATIVE
Comment: NEGATIVE
Comment: NEGATIVE

## 2022-01-01 NOTE — Telephone Encounter (Signed)
Confirm paperwork and status for provider.

## 2022-01-02 NOTE — Telephone Encounter (Signed)
Communicate with patient. Document encounter.

## 2022-01-03 ENCOUNTER — Telehealth: Payer: 59

## 2022-01-03 ENCOUNTER — Other Ambulatory Visit (HOSPITAL_BASED_OUTPATIENT_CLINIC_OR_DEPARTMENT_OTHER): Payer: Self-pay | Admitting: Obstetrics & Gynecology

## 2022-01-03 ENCOUNTER — Other Ambulatory Visit (HOSPITAL_COMMUNITY): Payer: Self-pay

## 2022-01-03 MED ORDER — METRONIDAZOLE 500 MG PO TABS
500.0000 mg | ORAL_TABLET | Freq: Two times a day (BID) | ORAL | 0 refills | Status: DC
  Filled 2022-01-03: qty 14, 7d supply, fill #0

## 2022-01-10 ENCOUNTER — Telehealth (HOSPITAL_BASED_OUTPATIENT_CLINIC_OR_DEPARTMENT_OTHER): Payer: Self-pay

## 2022-01-10 NOTE — Telephone Encounter (Signed)
FMLA was completed by provider and faxed by her request to [phone].  [employer]

## 2022-01-13 ENCOUNTER — Ambulatory Visit (HOSPITAL_BASED_OUTPATIENT_CLINIC_OR_DEPARTMENT_OTHER): Payer: 59

## 2022-01-14 ENCOUNTER — Ambulatory Visit (INDEPENDENT_AMBULATORY_CARE_PROVIDER_SITE_OTHER): Payer: 59 | Admitting: Nurse Practitioner

## 2022-01-14 ENCOUNTER — Ambulatory Visit (HOSPITAL_BASED_OUTPATIENT_CLINIC_OR_DEPARTMENT_OTHER): Payer: 59

## 2022-01-14 ENCOUNTER — Other Ambulatory Visit: Payer: Self-pay | Admitting: Obstetrics & Gynecology

## 2022-01-14 ENCOUNTER — Other Ambulatory Visit (HOSPITAL_COMMUNITY)
Admission: RE | Admit: 2022-01-14 | Discharge: 2022-01-14 | Disposition: A | Discharge status: Home / Self Care | Payer: 59 | Source: Ambulatory Visit | Attending: Obstetrics & Gynecology | Admitting: Obstetrics & Gynecology

## 2022-01-14 ENCOUNTER — Encounter (HOSPITAL_BASED_OUTPATIENT_CLINIC_OR_DEPARTMENT_OTHER): Payer: Self-pay | Admitting: Nurse Practitioner

## 2022-01-14 ENCOUNTER — Other Ambulatory Visit (HOSPITAL_COMMUNITY): Payer: Self-pay

## 2022-01-14 ENCOUNTER — Ambulatory Visit
Admission: RE | Admit: 2022-01-14 | Discharge: 2022-01-14 | Disposition: A | Discharge status: Home / Self Care | Payer: 59 | Source: Ambulatory Visit | Attending: Obstetrics & Gynecology | Admitting: Obstetrics & Gynecology

## 2022-01-14 DIAGNOSIS — F339 Major depressive disorder, recurrent, unspecified: Secondary | ICD-10-CM | POA: Diagnosis not present

## 2022-01-14 DIAGNOSIS — R921 Mammographic calcification found on diagnostic imaging of breast: Secondary | ICD-10-CM

## 2022-01-14 DIAGNOSIS — N898 Other specified noninflammatory disorders of vagina: Secondary | ICD-10-CM | POA: Insufficient documentation

## 2022-01-14 DIAGNOSIS — F5102 Adjustment insomnia: Secondary | ICD-10-CM | POA: Diagnosis not present

## 2022-01-14 DIAGNOSIS — R928 Other abnormal and inconclusive findings on diagnostic imaging of breast: Secondary | ICD-10-CM

## 2022-01-14 DIAGNOSIS — I10 Essential (primary) hypertension: Secondary | ICD-10-CM

## 2022-01-14 DIAGNOSIS — S62354S Nondisplaced fracture of shaft of fourth metacarpal bone, right hand, sequela: Secondary | ICD-10-CM | POA: Diagnosis not present

## 2022-01-14 DIAGNOSIS — F413 Other mixed anxiety disorders: Secondary | ICD-10-CM

## 2022-01-14 DIAGNOSIS — R202 Paresthesia of skin: Secondary | ICD-10-CM

## 2022-01-14 DIAGNOSIS — M47817 Spondylosis without myelopathy or radiculopathy, lumbosacral region: Secondary | ICD-10-CM

## 2022-01-14 MED ORDER — VENLAFAXINE HCL ER 150 MG PO CP24
150.0000 mg | ORAL_CAPSULE | Freq: Every day | ORAL | 6 refills | Status: DC
  Filled 2022-01-14: qty 30, 30d supply, fill #0
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
## 2022-01-15 ENCOUNTER — Telehealth (HOSPITAL_BASED_OUTPATIENT_CLINIC_OR_DEPARTMENT_OTHER): Payer: Self-pay | Admitting: Nurse Practitioner

## 2022-01-15 NOTE — Telephone Encounter (Signed)
Received  FMLA -from [place] [id number] paperwork  25 pages  Placed in Providers Box up front

## 2022-01-16 ENCOUNTER — Encounter (HOSPITAL_BASED_OUTPATIENT_CLINIC_OR_DEPARTMENT_OTHER): Payer: Self-pay | Admitting: Obstetrics & Gynecology

## 2022-01-16 ENCOUNTER — Other Ambulatory Visit (HOSPITAL_COMMUNITY): Payer: Self-pay

## 2022-01-16 LAB — CERVICOVAGINAL ANCILLARY ONLY
Bacterial Vaginitis (gardnerella): POSITIVE — AB
Candida Glabrata: NEGATIVE
Candida Vaginitis: POSITIVE — AB
Comment: NEGATIVE
Comment: NEGATIVE
Comment: NEGATIVE

## 2022-01-16 MED ORDER — FLUCONAZOLE 150 MG PO TABS
150.0000 mg | ORAL_TABLET | Freq: Once | ORAL | 0 refills | Status: AC
  Filled 2022-01-16: qty 2, 2d supply, fill #0

## 2022-01-16 MED ORDER — METRONIDAZOLE 0.75 % VA GEL
1.0000 | Freq: Every day | VAGINAL | 0 refills | Status: DC
  Filled 2022-01-16: qty 70, 5d supply, fill #0

## 2022-01-16 NOTE — Addendum Note (Signed)
Addended by: Megan Salon on: 01/16/2022 05:15 PM   Modules accepted: Orders

## 2022-01-17 ENCOUNTER — Other Ambulatory Visit (HOSPITAL_COMMUNITY): Payer: Self-pay

## 2022-01-17 ENCOUNTER — Telehealth (HOSPITAL_BASED_OUTPATIENT_CLINIC_OR_DEPARTMENT_OTHER): Payer: Self-pay

## 2022-01-17 NOTE — Telephone Encounter (Signed)
FMLA paper work was received and placed in providers inbox.

## 2022-01-19 DIAGNOSIS — E876 Hypokalemia: Secondary | ICD-10-CM | POA: Insufficient documentation
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
## 2022-01-19 NOTE — Assessment & Plan Note (Signed)
See lumbosacral spondylosis from same visit.

## 2022-01-19 NOTE — Assessment & Plan Note (Signed)
Present on recent labs. Recommend supplementation- likely related to HCTZ. Will plan to recheck in 3 months.

## 2022-01-19 NOTE — Assessment & Plan Note (Signed)
Improvement of symptoms with the start of venlafaxine. She still is experiencing symptoms of depression, therefore we may need to consider increasing the dose. We discussed this option today, but she would like to give the current dose a few more weeks to reach maximum benefit, which I feel is appropriate. We will plan to re-evaluate in the next 4 weeks to ensure that the dosing is appropriate.

## 2022-01-30 ENCOUNTER — Encounter (HOSPITAL_BASED_OUTPATIENT_CLINIC_OR_DEPARTMENT_OTHER): Payer: Self-pay | Admitting: Nurse Practitioner
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
## 2022-02-03 ENCOUNTER — Other Ambulatory Visit (HOSPITAL_COMMUNITY)
Admission: RE | Admit: 2022-02-03 | Discharge: 2022-02-03 | Disposition: A | Discharge status: Home / Self Care | Payer: 59 | Source: Ambulatory Visit | Attending: Obstetrics & Gynecology | Admitting: Obstetrics & Gynecology

## 2022-02-03 ENCOUNTER — Encounter (HOSPITAL_BASED_OUTPATIENT_CLINIC_OR_DEPARTMENT_OTHER): Payer: Self-pay | Admitting: Obstetrics & Gynecology

## 2022-02-03 ENCOUNTER — Ambulatory Visit (INDEPENDENT_AMBULATORY_CARE_PROVIDER_SITE_OTHER): Payer: 59 | Admitting: Obstetrics & Gynecology

## 2022-02-03 ENCOUNTER — Other Ambulatory Visit (HOSPITAL_COMMUNITY): Payer: Self-pay

## 2022-02-03 ENCOUNTER — Other Ambulatory Visit (HOSPITAL_BASED_OUTPATIENT_CLINIC_OR_DEPARTMENT_OTHER): Payer: Self-pay

## 2022-02-03 VITALS — BP 131/81 | HR 64 | Ht 66.5 in | Wt 184.0 lb

## 2022-02-03 DIAGNOSIS — R8290 Unspecified abnormal findings in urine: Secondary | ICD-10-CM | POA: Diagnosis not present

## 2022-02-03 DIAGNOSIS — N898 Other specified noninflammatory disorders of vagina: Secondary | ICD-10-CM | POA: Diagnosis not present

## 2022-02-03 DIAGNOSIS — D500 Iron deficiency anemia secondary to blood loss (chronic): Secondary | ICD-10-CM | POA: Diagnosis not present

## 2022-02-03 LAB — POCT URINALYSIS DIPSTICK
Bilirubin, UA: NEGATIVE
Glucose, UA: NEGATIVE
Ketones, UA: NEGATIVE
Leukocytes, UA: NEGATIVE
Nitrite, UA: NEGATIVE
Protein, UA: NEGATIVE
Spec Grav, UA: 1.02 (ref 1.010–1.025)
Urobilinogen, UA: 2 E.U./dL — AB
pH, UA: 7 (ref 5.0–8.0)

## 2022-02-03 MED ORDER — TINIDAZOLE 500 MG PO TABS
500.0000 mg | ORAL_TABLET | Freq: Two times a day (BID) | ORAL | 0 refills | Status: DC
  Filled 2022-02-03: qty 14, 7d supply, fill #0
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
## 2022-02-04 ENCOUNTER — Encounter (HOSPITAL_BASED_OUTPATIENT_CLINIC_OR_DEPARTMENT_OTHER): Payer: Self-pay | Admitting: Obstetrics & Gynecology

## 2022-02-04 LAB — CERVICOVAGINAL ANCILLARY ONLY
Bacterial Vaginitis (gardnerella): POSITIVE — AB
Candida Glabrata: NEGATIVE
Candida Vaginitis: NEGATIVE
Comment: NEGATIVE
Comment: NEGATIVE
Comment: NEGATIVE

## 2022-02-04 LAB — IRON,TIBC AND FERRITIN PANEL
Ferritin: 60 ng/mL (ref 15–150)
Iron Saturation: 23 % (ref 15–55)
Iron: 57 ug/dL (ref 27–159)
Total Iron Binding Capacity: 249 ug/dL — ABNORMAL LOW (ref 250–450)
UIBC: 192 ug/dL (ref 131–425)

## 2022-02-04 LAB — URINE CULTURE

## 2022-02-06 ENCOUNTER — Encounter: Payer: Self-pay | Admitting: Professional

## 2022-02-06 ENCOUNTER — Ambulatory Visit (INDEPENDENT_AMBULATORY_CARE_PROVIDER_SITE_OTHER): Payer: 59 | Admitting: Professional

## 2022-02-06 DIAGNOSIS — F339 Major depressive disorder, recurrent, unspecified: Secondary | ICD-10-CM

## 2022-02-06 DIAGNOSIS — F413 Other mixed anxiety disorders: Secondary | ICD-10-CM
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
## 2022-02-06 NOTE — Progress Notes (Signed)
k

## 2022-02-22 ENCOUNTER — Ambulatory Visit: Payer: 59 | Admitting: Professional

## 2022-02-25 DIAGNOSIS — M25531 Pain in right wrist: Secondary | ICD-10-CM | POA: Diagnosis not present

## 2022-02-25 DIAGNOSIS — S62354A Nondisplaced fracture of shaft of fourth metacarpal bone, right hand, initial encounter for closed fracture: Secondary | ICD-10-CM | POA: Diagnosis not present

## 2022-03-04 ENCOUNTER — Telehealth (HOSPITAL_BASED_OUTPATIENT_CLINIC_OR_DEPARTMENT_OTHER): Payer: Self-pay | Admitting: Nurse Practitioner

## 2022-03-04 ENCOUNTER — Ambulatory Visit (HOSPITAL_BASED_OUTPATIENT_CLINIC_OR_DEPARTMENT_OTHER): Payer: 59

## 2022-03-04 NOTE — Telephone Encounter (Signed)
Pts FMLA is only allowing 1 day per month --she needs at least 2 for therapy

## 2022-03-06 ENCOUNTER — Encounter: Payer: Self-pay | Admitting: Professional

## 2022-03-06 ENCOUNTER — Ambulatory Visit (INDEPENDENT_AMBULATORY_CARE_PROVIDER_SITE_OTHER): Payer: 59 | Admitting: Professional

## 2022-03-06 DIAGNOSIS — F339 Major depressive disorder, recurrent, unspecified: Secondary | ICD-10-CM | POA: Diagnosis not present

## 2022-03-06 DIAGNOSIS — F413 Other mixed anxiety disorders: Secondary | ICD-10-CM

## 2022-03-06 NOTE — Progress Notes (Signed)
[place] Counselor/Therapist Progress Note  Patient ID: Caitlyn Richardson, MRN: [id number],    Date: 03/06/2022  Time Spent: 64 minutes 501-605pm   Treatment Type: Individual Therapy  Risk Assessment: Danger to Self:  No Self-injurious Behavior: No Danger to Others: No  Subjective: The patient arrived on time for her in-person appointment.  Issues addressed: 1-treatment planning -pt and Clinician completed treatment plan -pt fully participated and is in agreement with plan 2-coping with stressors -self-care time   -begin taking a daily time out so that you can refresh and be available to your family -make a financial plan that helps to increase credit score   -pt wants to move closer to work and not drive an hour five days per week   -research alternatives for improving debt to income ratio  Treatment Plan Problems Addressed  Anxiety, Childhood Trauma, Financial Stress  Goals 1. Develop an awareness of how childhood issues have affected and continue to affect one's family life. Objective Describe what it was like to grow up in the home environment. Target Date: 2023-03-06 Frequency: Biweekly  Progress: 0 Modality: individual  Related Interventions Develop the client's family genogram and/or symptom line and help identify patterns of dysfunction within the family. Objective Describe each family member and identify the role each played within the family. Target Date: 2023-03-06 Frequency: Biweekly  Progress: 0 Modality: individual  Related Interventions Assist the client in clarifying his/her role within the family and his/her feelings connected to that role. Objective Identify feelings associated with major traumatic incidents in childhood and with parental child-rearing patterns. Target Date: 2023-03-06 Frequency: Biweekly  Progress: 0 Modality: individual  Related Interventions Support and encourage the client when he/she begins to express feelings of  rage, sadness, fear, and rejection relating to family abuse or neglect. Objective Identify how own parenting has been influenced by childhood experiences. Target Date: 2023-03-06 Frequency: Biweekly  Progress: 0 Modality: individual  Related Interventions Ask the client to compare his/her parenting behavior to that of parent figures of his/her childhood; encourage the client to be aware of how easily we repeat patterns that we grew up with. 2. Learn and implement coping skills that result in a reduction of anxiety and worry, and improved daily functioning. 3. Reduce overall frequency, intensity, and duration of the anxiety so that daily functioning is not impaired. Objective Describe situations, thoughts, feelings, and actions associated with anxieties and worries, their impact on functioning, and attempts to resolve them. Target Date: 2023-03-06 Frequency: Biweekly  Progress: 0 Modality: individual  Related Interventions Ask the client to describe his/her past experiences of anxiety and their impact on functioning; assess the focus, excessiveness, and uncontrollability of the worry and the type, frequency, intensity, and duration of his/her anxiety symptoms (consider using a structured interview such as The Anxiety Disorders Interview Schedule-Adult Version). Objective Learn and implement calming skills to reduce overall anxiety and manage anxiety symptoms. Target Date: 2023-03-06 Frequency: Biweekly  Progress: 0 Modality: individual  Related Interventions Teach the client calming/relaxation skills (e.g., applied relaxation, progressive muscle relaxation, cue controlled relaxation; mindful breathing; biofeedback) and how to discriminate better between relaxation and tension; teach the client how to apply these skills to his/her daily life (e.g., New Directions in Progressive Muscle Relaxation by Casper Harrison, and Hazlett-Stevens; Treating Generalized Anxiety Disorder by Rygh and  Amparo Bristol). Assign the client homework each session in which he/she practices relaxation exercises daily, gradually applying them progressively from non-anxiety-provoking to anxiety-provoking situations; review and reinforce success while providing corrective feedback toward improvement.  Assign the client to read about progressive muscle relaxation and other calming strategies in relevant books or treatment manuals (e.g., Progressive Relaxation Training by Gwynneth Aliment and Dani Gobble; Mastery of Your Anxiety and Worry: Workbook by Beckie Busing). Objective Learn and implement a strategy to limit the association between various environmental settings and worry, delaying the worry until a designated "worry time." Target Date: 2023-03-06 Frequency: Biweekly  Progress: 0 Modality: individual  Related Interventions Explain the rationale for using a worry time as well as how it is to be used; agree upon and implement a worry time with the client. Teach the client how to recognize, stop, and postpone worry to the agreed upon worry time using skills such as thought stopping, relaxation, and redirecting attention (or assign "Making Use of the Thought-Stopping Technique" and/or "Worry Time" in the Adult Psychotherapy Homework Planner by Jongsma to assist skill development); encourage use in daily life; review and reinforce success while providing corrective feedback toward improvement. Objective Identify, challenge, and replace biased, fearful self-talk with positive, realistic, and empowering self-talk. Target Date: 2023-03-06 Frequency: Biweekly  Progress: 0 Modality: individual  Related Interventions Explore the client's schema and self-talk that mediate his/her fear response; assist him/her in challenging the biases; replace the distorted messages with reality-based alternatives and positive, realistic self-talk that will increase his/her self-confidence in coping with irrational fears (see Cognitive Therapy  of Anxiety Disorders by Alison Stalling). Assign the client a homework exercise in which he/she identifies fearful self-talk, identifies biases in the self-talk, generates alternatives, and tests through behavioral experiments (or assign "Negative Thoughts Trigger Negative Feelings" in the Adult Psychotherapy Homework Planner by [employer]); review and reinforce success, providing corrective feedback toward improvement. Objective Learn and implement problem-solving strategies for realistically addressing worries. Target Date: 2023-03-06 Frequency: Biweekly  Progress: 0 Modality: individual  Related Interventions Teach the client problem-solving strategies involving specifically defining a problem, generating options for addressing it, evaluating the pros and cons of each option, selecting and implementing an optional action, and reevaluating and refining the action (or assign "Applying Problem-Solving to Interpersonal Conflict" in the Adult Psychotherapy Homework Planner by Bryn Gulling). Assign the client a homework exercise in which he/she problem-solves a current problem (see Mastery of Your Anxiety and Worry: Workbook by Adora Fridge and Eliot Ford or Generalized Anxiety Disorder by Eather Colas, and Eliot Ford); review, reinforce success, and provide corrective feedback toward improvement. Objective Identify and engage in pleasant activities on a daily basis. Target Date: 2023-03-06 Frequency: Biweekly  Progress: 0 Modality: individual  Related Interventions Engage the client in behavioral activation, increasing the client's contact with sources of reward, identifying processes that inhibit activation, and teaching skills to solve life problems (or assign "Identify and Schedule Pleasant Activities" in the Adult Psychotherapy Homework Planner by Jongsma); use behavioral techniques such as instruction, rehearsal, role-playing, role reversal as needed to assist adoption into the client's daily life; reinforce  success. 4. Resolve financial crisis with a path to eliminate debt. Objective Identify priorities that should control how money is spent. Target Date: 2023-03-06 Frequency: Biweekly  Progress: 0 Modality: individual  Related Interventions Ask the client to list the priorities that he/she believes should give direction to how his/her money is spent; process those priorities. Objective Describe the family-of-origin pattern of money management. Target Date: 2023-03-06 Frequency: Biweekly  Progress: 0 Modality: individual  Related Interventions Explore the client's family-of-origin patterns of earning, saving, and spending money, focusing on how those patterns are influencing his/her current financial decisions. Objective Write a budget that balances income with  expenses. Target Date: 2023-03-06 Frequency: Biweekly  Progress: 0 Modality: individual  Related Interventions Review the client's budget as to reasonableness and completeness. If financial planning is needed, refer to a professional planner or ask partners to write a current budget and a long-range savings and investment plan (consider assigning "Plan a Budget" from the Adult Psychotherapy Homework Planner by Bryn Gulling or The Budget Kit: The [employer] Money Management Workbook by [employer]). Objective Keep weekly and monthly records of financial income and expenses. Target Date: 2023-03-06 Frequency: Biweekly  Progress: 0 Modality: individual  Related Interventions Encourage the client to keep a weekly and monthly record of income and outflow; review his/her records weekly, and reinforce his/her responsible financial decision-making. Offer praise and ongoing encouragement of the client's progress toward debt resolution; recommend the client read The Total Money Makeover: A Proven Plan for Financial Fitness by Sherrell Puller). Objective Set financial goals and make budgetary decisions that lessen anxiety. Target Date: 2023-03-06 Frequency:  Biweekly  Progress: 0 Modality: individual  Related Interventions Reinforce changes in managing money that reflect responsible planning, and following of her budget.  Diagnosis:Depression, recurrent ([place])  Other mixed anxiety disorders  Plan:  -meet again on Thursday, March 13, 2022 at 1030am in-person at [employer] office. -assist pt in securing a therapist at [facility] that she can see in-person.

## 2022-03-06 NOTE — Progress Notes (Signed)
° ° ° ° ° ° ° ° ° ° ° ° ° ° °  Ulys Favia, LCMHC °

## 2022-03-08 ENCOUNTER — Ambulatory Visit: Payer: 59 | Admitting: Professional

## 2022-03-11 ENCOUNTER — Encounter (HOSPITAL_BASED_OUTPATIENT_CLINIC_OR_DEPARTMENT_OTHER): Payer: Self-pay

## 2022-03-11 ENCOUNTER — Other Ambulatory Visit (HOSPITAL_COMMUNITY)
Admission: RE | Admit: 2022-03-11 | Discharge: 2022-03-11 | Disposition: A | Discharge status: Home / Self Care | Payer: 59 | Source: Ambulatory Visit | Attending: Obstetrics & Gynecology | Admitting: Obstetrics & Gynecology

## 2022-03-11 ENCOUNTER — Ambulatory Visit (INDEPENDENT_AMBULATORY_CARE_PROVIDER_SITE_OTHER): Payer: 59 | Admitting: *Deleted

## 2022-03-11 ENCOUNTER — Inpatient Hospital Stay: Payer: 59

## 2022-03-11 ENCOUNTER — Inpatient Hospital Stay: Payer: 59 | Attending: Oncology | Admitting: Oncology

## 2022-03-11 VITALS — BP 136/87 | HR 64 | Temp 98.2°F | Resp 18 | Ht 66.5 in | Wt 181.8 lb

## 2022-03-11 DIAGNOSIS — D509 Iron deficiency anemia, unspecified: Secondary | ICD-10-CM

## 2022-03-11 DIAGNOSIS — I10 Essential (primary) hypertension: Secondary | ICD-10-CM | POA: Insufficient documentation

## 2022-03-11 DIAGNOSIS — N898 Other specified noninflammatory disorders of vagina: Secondary | ICD-10-CM | POA: Insufficient documentation

## 2022-03-11 DIAGNOSIS — N920 Excessive and frequent menstruation with regular cycle: Secondary | ICD-10-CM | POA: Insufficient documentation

## 2022-03-11 DIAGNOSIS — M5127 Other intervertebral disc displacement, lumbosacral region: Secondary | ICD-10-CM | POA: Diagnosis not present

## 2022-03-11 DIAGNOSIS — R5381 Other malaise: Secondary | ICD-10-CM | POA: Insufficient documentation

## 2022-03-11 DIAGNOSIS — Z86018 Personal history of other benign neoplasm: Secondary | ICD-10-CM | POA: Diagnosis not present

## 2022-03-11 DIAGNOSIS — Z79899 Other long term (current) drug therapy: Secondary | ICD-10-CM | POA: Diagnosis not present

## 2022-03-11 LAB — CBC WITH DIFFERENTIAL (CANCER CENTER ONLY)
Abs Immature Granulocytes: 0.03 10*3/uL (ref 0.00–0.07)
Basophils Absolute: 0 10*3/uL (ref 0.0–0.1)
Basophils Relative: 1 %
Eosinophils Absolute: 0.1 10*3/uL (ref 0.0–0.5)
Eosinophils Relative: 1 %
HCT: 37.6 % (ref 36.0–46.0)
Hemoglobin: 12.5 g/dL (ref 12.0–15.0)
Immature Granulocytes: 0 %
Lymphocytes Relative: 20 %
Lymphs Abs: 1.5 10*3/uL (ref 0.7–4.0)
MCH: 28.7 pg (ref 26.0–34.0)
MCHC: 33.2 g/dL (ref 30.0–36.0)
MCV: 86.4 fL (ref 80.0–100.0)
Monocytes Absolute: 0.7 10*3/uL (ref 0.1–1.0)
Monocytes Relative: 9 %
Neutro Abs: 5 10*3/uL (ref 1.7–7.7)
Neutrophils Relative %: 69 %
Platelet Count: 333 10*3/uL (ref 150–400)
RBC: 4.35 MIL/uL (ref 3.87–5.11)
RDW: 13.1 % (ref 11.5–15.5)
WBC Count: 7.3 10*3/uL (ref 4.0–10.5)
nRBC: 0 % (ref 0.0–0.2)

## 2022-03-11 LAB — FERRITIN: Ferritin: 42 ng/mL (ref 11–307)

## 2022-03-11 NOTE — Telephone Encounter (Signed)
Please advise 

## 2022-03-11 NOTE — Progress Notes (Signed)
Pt here for repeat aptima. Self swab instructed and performed. Advised that we would notify her of results once received.

## 2022-03-11 NOTE — Progress Notes (Signed)
  [facility] OFFICE PROGRESS NOTE   Diagnosis: Iron deficiency anemia  INTERVAL HISTORY:   Caitlyn Richardson returns as scheduled.  She is not taking iron.  No bleeding.  No difficulty with bowel or bladder function.  She reports pain in the posterior left leg following an injury.  She is followed by neurosurgery and reports the pain is felt to be radicular pain from the back.  She is seeing Dr. Christella Noa later today. She has chronic malaise.  No dyspnea.  Good appetite. Objective:  Vital signs in last 24 hours:  Blood pressure 136/87, pulse 64, temperature 98.2 F (36.8 C), resp. rate 18, height 5' 6.5" (1.689 m), weight 181 lb 12.8 oz (82.5 kg), last menstrual period 09/22/2021, SpO2 100 %.    Resp: Lungs clear bilaterally Cardio: Regular rate and rhythm GI: No hepatosplenomegaly Vascular: No leg edema Musculoskeletal: No spine tenderness  Lab Results:  Lab Results  Component Value Date   WBC 7.3 03/11/2022   HGB 12.5 03/11/2022   HCT 37.6 03/11/2022   MCV 86.4 03/11/2022   PLT 333 03/11/2022   NEUTROABS 5.0 03/11/2022    CMP  Lab Results  Component Value Date   NA 140 11/20/2021   K 3.4 (L) 11/20/2021   CL 101 11/20/2021   CO2 24 11/20/2021   GLUCOSE 92 11/20/2021   BUN 12 11/20/2021   CREATININE 0.84 11/20/2021   CALCIUM 9.9 11/20/2021   PROT 7.4 11/20/2021   ALBUMIN 4.6 11/20/2021   AST 13 11/20/2021   ALT 12 11/20/2021   ALKPHOS 98 11/20/2021   BILITOT 0.8 11/20/2021   GFRNONAA >60 10/28/2021   Medications: I have reviewed the patient's current medications.   Assessment/Plan: Iron deficiency anemia likely related to menstrual blood loss 10/09/2020 hemoglobin 8.3, MCV 73  10/31/2020 ferritin 3 Ferrous sulfate 325 mg 2-3 times daily 11/02/2020 Urinalysis negative for blood 11/02/2020 Stool cards negative for occult blood x3 12/10/2020 12/18/2020 hemoglobin 11.5  Ferrous sulfate 325 mg twice daily continued, changed to once daily dosing October  2022 11/08/2021 oral iron discontinued approximately 1 week ago 11/08/2021 hemoglobin 12.1, MCV 85   Menorrhagia, history of uterine fibroids, status post myomectomy Pelvic ultrasound 02/20/2021-multiple uterine fibroids Laparoscopic hysterectomy 10/30/2021 Hypertension     Disposition: Caitlyn Richardson has a history of iron deficiency anemia.  The anemia has corrected.  The hemoglobin, MCV, and ferritin level are normal today.  The anemia was likely secondary to menorrhagia.  She has undergone a hysterectomy.  She was discharged in the hematology clinic today.  I am available to see her in the future as needed.  She will continue follow-up with her primary provider to evaluate malaise and with neurosurgery to evaluate the left leg pain.  Betsy Coder, MD  03/11/2022  12:13 PM

## 2022-03-12 ENCOUNTER — Telehealth (HOSPITAL_BASED_OUTPATIENT_CLINIC_OR_DEPARTMENT_OTHER): Payer: Self-pay

## 2022-03-12 LAB — CERVICOVAGINAL ANCILLARY ONLY
Bacterial Vaginitis (gardnerella): NEGATIVE
Candida Glabrata: NEGATIVE
Candida Vaginitis: NEGATIVE
Comment: NEGATIVE
Comment: NEGATIVE
Comment: NEGATIVE

## 2022-03-12 NOTE — Telephone Encounter (Signed)
LVM for PT- to advise that paperwork was sent to [place]

## 2022-03-12 NOTE — Telephone Encounter (Signed)
Pt came by this morning and Gwinda Passe brought paper back FMLA for [place] to correct for patient to have 2 days per month for FMLA.

## 2022-03-12 NOTE — Telephone Encounter (Signed)
I showed Caitlyn Richardson how to pull from [employer] the FMLA that was completed prior.  Caitlyn Richardson updated the forms and the Updated FMLA was faxed to [employer] attn Lorin Mercy this AM. ( With signature and provider stamp of updated times)  Updated forms placed in [employer] with confirmation.

## 2022-03-13 ENCOUNTER — Ambulatory Visit: Payer: 59 | Admitting: Professional

## 2022-03-15 ENCOUNTER — Other Ambulatory Visit (HOSPITAL_BASED_OUTPATIENT_CLINIC_OR_DEPARTMENT_OTHER): Payer: Self-pay | Admitting: Nurse Practitioner

## 2022-03-15 ENCOUNTER — Other Ambulatory Visit (HOSPITAL_COMMUNITY): Payer: Self-pay

## 2022-03-15 DIAGNOSIS — M25531 Pain in right wrist: Secondary | ICD-10-CM | POA: Diagnosis not present

## 2022-03-18 ENCOUNTER — Ambulatory Visit: Payer: 59 | Admitting: Psychology

## 2022-03-19 ENCOUNTER — Encounter (HOSPITAL_BASED_OUTPATIENT_CLINIC_OR_DEPARTMENT_OTHER): Payer: Self-pay | Admitting: Nurse Practitioner

## 2022-03-19 ENCOUNTER — Other Ambulatory Visit (HOSPITAL_COMMUNITY): Payer: Self-pay

## 2022-03-19 MED ORDER — VENLAFAXINE HCL 75 MG PO TABS
75.0000 mg | ORAL_TABLET | Freq: Every day | ORAL | 3 refills | Status: DC
  Filled 2022-03-19: qty 90, 90d supply, fill #0
  Filled 2022-06-20: qty 90, 90d supply, fill #1
  Filled 2022-11-15 (×2): qty 90, 90d supply, fill #2
  Filled 2023-03-02: qty 90, 90d supply, fill #3

## 2022-03-20 DIAGNOSIS — S62354A Nondisplaced fracture of shaft of fourth metacarpal bone, right hand, initial encounter for closed fracture: Secondary | ICD-10-CM | POA: Diagnosis not present

## 2022-03-20 DIAGNOSIS — M25531 Pain in right wrist: Secondary | ICD-10-CM | POA: Diagnosis not present

## 2022-03-21 ENCOUNTER — Ambulatory Visit: Payer: 59 | Admitting: Professional

## 2022-03-21 ENCOUNTER — Encounter (HOSPITAL_BASED_OUTPATIENT_CLINIC_OR_DEPARTMENT_OTHER): Payer: Self-pay | Admitting: Nurse Practitioner

## 2022-03-25 ENCOUNTER — Ambulatory Visit (INDEPENDENT_AMBULATORY_CARE_PROVIDER_SITE_OTHER): Payer: 59 | Admitting: Psychology

## 2022-03-25 DIAGNOSIS — F413 Other mixed anxiety disorders: Secondary | ICD-10-CM

## 2022-03-25 DIAGNOSIS — F339 Major depressive disorder, recurrent, unspecified: Secondary | ICD-10-CM

## 2022-03-25 NOTE — Progress Notes (Signed)
? ? ? ? ? ? ? ? ? ? ? ? ? ? ?  Caitlyn Richardson, LCMHC ?
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
## 2022-04-01 ENCOUNTER — Ambulatory Visit: Payer: 59 | Admitting: Psychology

## 2022-04-04 ENCOUNTER — Ambulatory Visit: Payer: 59 | Admitting: Professional

## 2022-04-08 ENCOUNTER — Encounter: Payer: Self-pay | Admitting: Nurse Practitioner
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
## 2022-04-09 NOTE — Assessment & Plan Note (Signed)
Chronic. BP slightly elevated in office today. She is experiencing physical and emotional pain, which may be contributing. Recommend close monitoring of BP at home and notifying if readings are greater than 130/80 on regular basis so that we can adjust medication accordingly. No alarm sx present at this time.
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
## 2022-04-09 NOTE — Assessment & Plan Note (Signed)
Increased anxiety and difficulty sleeping associated with recent events in her life. Discussion of ways to help with relaxation to facilitate improved rest. Recommend hydroxyzine at bedtime. Avoid using xanax for sleep as this can create a dependence. F/U if no improvement.
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
## 2022-04-11 ENCOUNTER — Other Ambulatory Visit (HOSPITAL_COMMUNITY): Payer: Self-pay

## 2022-04-11 DIAGNOSIS — M5117 Intervertebral disc disorders with radiculopathy, lumbosacral region: Secondary | ICD-10-CM | POA: Diagnosis not present

## 2022-04-11 DIAGNOSIS — M5126 Other intervertebral disc displacement, lumbar region: Secondary | ICD-10-CM | POA: Diagnosis not present

## 2022-04-11 DIAGNOSIS — M5127 Other intervertebral disc displacement, lumbosacral region: Secondary | ICD-10-CM | POA: Diagnosis not present

## 2022-04-11 HISTORY — PX: MICRODISCECTOMY LUMBAR: SUR864

## 2022-04-11 MED ORDER — CYCLOBENZAPRINE HCL 10 MG PO TABS
10.0000 mg | ORAL_TABLET | Freq: Three times a day (TID) | ORAL | 1 refills | Status: DC | PRN
  Filled 2022-04-11: qty 60, 20d supply, fill #0

## 2022-04-11 MED ORDER — HYDROCODONE-ACETAMINOPHEN 5-325 MG PO TABS
1.0000 | ORAL_TABLET | Freq: Four times a day (QID) | ORAL | 0 refills | Status: DC | PRN
  Filled 2022-04-11: qty 45, 12d supply, fill #0

## 2022-04-15 ENCOUNTER — Ambulatory Visit (HOSPITAL_BASED_OUTPATIENT_CLINIC_OR_DEPARTMENT_OTHER): Payer: 59 | Admitting: Nurse Practitioner

## 2022-04-15 ENCOUNTER — Encounter (HOSPITAL_BASED_OUTPATIENT_CLINIC_OR_DEPARTMENT_OTHER): Payer: Self-pay | Admitting: Family Medicine

## 2022-04-15 ENCOUNTER — Ambulatory Visit (HOSPITAL_BASED_OUTPATIENT_CLINIC_OR_DEPARTMENT_OTHER): Payer: 59 | Admitting: Family Medicine

## 2022-04-15 DIAGNOSIS — F339 Major depressive disorder, recurrent, unspecified: Secondary | ICD-10-CM

## 2022-04-15 DIAGNOSIS — I10 Essential (primary) hypertension: Secondary | ICD-10-CM | POA: Diagnosis not present

## 2022-04-15 DIAGNOSIS — Z23 Encounter for immunization: Secondary | ICD-10-CM

## 2022-04-15 NOTE — Assessment & Plan Note (Signed)
Patient currently takes Effexor 75 mg, was started by prior PCP here at our office.  She reports that she has been doing fairly well with the medication.  It had been discussed to possibly increase the dose to 150 mg, however she has refrained from doing so.  She also continues with counseling and therapy and reports that this has been going well.  She denies any concerns today At this time, we can continue with 75 mg dose of Effexor, plan for follow-up in 3 to 4 months to monitor progress or sooner as needed

## 2022-04-15 NOTE — Assessment & Plan Note (Signed)
Blood pressure controlled in office today.  She continues with amlodipine as well as olmesartan-hydrochlorothiazide.  Denies any issues with medications at this time.  She does not check her blood pressure regularly at home.  No issues with chest pain or headaches. Given good control of blood pressure today as well as at prior office visits, can continue with current medication regimen Recommend intermittent monitoring of blood pressure at home, DASH diet

## 2022-04-15 NOTE — Progress Notes (Signed)
    Procedures performed today:    None.  Independent interpretation of notes and tests performed by another provider:   None.  Brief History, Exam, Impression, and Recommendations:    BP 120/88 (BP Location: Left Arm, Patient Position: Sitting, Cuff Size: Large)   Pulse 72   Ht 5' 6.5" (1.689 m)   Wt 185 lb (83.9 kg)   LMP 09/22/2021 (Approximate)   SpO2 100%   BMI 29.41 kg/m   Essential hypertension Blood pressure controlled in office today.  She continues with amlodipine as well as olmesartan-hydrochlorothiazide.  Denies any issues with medications at this time.  She does not check her blood pressure regularly at home.  No issues with chest pain or headaches. Given good control of blood pressure today as well as at prior office visits, can continue with current medication regimen Recommend intermittent monitoring of blood pressure at home, DASH diet  Depression, recurrent ([place]) Patient currently takes Effexor 75 mg, was started by prior PCP here at our office.  She reports that she has been doing fairly well with the medication.  It had been discussed to possibly increase the dose to 150 mg, however she has refrained from doing so.  She also continues with counseling and therapy and reports that this has been going well.  She denies any concerns today At this time, we can continue with 75 mg dose of Effexor, plan for follow-up in 3 to 4 months to monitor progress or sooner as needed  Return in about 4 months (around 08/15/2022) for HTN, depression.   ___________________________________________ Aireona Torelli de [place], MD, ABFM, CA[facility] [place]

## 2022-04-24 ENCOUNTER — Ambulatory Visit (INDEPENDENT_AMBULATORY_CARE_PROVIDER_SITE_OTHER): Payer: 59 | Admitting: Psychology

## 2022-04-24 DIAGNOSIS — F339 Major depressive disorder, recurrent, unspecified: Secondary | ICD-10-CM

## 2022-04-24 DIAGNOSIS — F413 Other mixed anxiety disorders: Secondary | ICD-10-CM | POA: Diagnosis not present

## 2022-04-24 NOTE — Progress Notes (Signed)
[facility] Counselor/Therapist Progress Note  Patient ID: Caitlyn Richardson, MRN: [id number],    Date: 04/24/2022  Time Spent: 11:01am-11:59am  Pt is seen for an in person visit. Treatment Type: Individual Therapy  Reported Symptoms: improved w/ pain since surgery.  Setting boundaries and recognizing not giving more than she has  Mental Status Exam: Appearance:  Well Groomed     Behavior: Appropriate  Motor: Normal  Speech/Language:  Normal Rate  Affect: Appropriate  Mood: anxious and depressed  Thought process: normal  Thought content:   WNL  Sensory/Perceptual disturbances:   WNL  Orientation: oriented to person, place, time/date, and situation  Attention: Good  Concentration: Good  Memory: WNL  Fund of knowledge:  Good  Insight:   Good  Judgment:  Good  Impulse Control: Good   Risk Assessment: Danger to Self:  No Self-injurious Behavior: No Danger to Others: No Duty to Warn:no Physical Aggression / Violence:No  Access to Firearms a concern: No  Gang Involvement:No   Subjective: Counselor assessed pt current functioning per pt report.  Processed w/ pt interactions w/ mom, daughters, ex.  Explored w/pt ways she is setting boundaries and reflected growth.  Discussed self compassion and giving to herself.   Pt affect wnl.  Pt reported she had her surgery and her post op and is healing well.  Pt reports her pain has improved.  Pt discussed asserting w/ her mom and positive outcome for.  Pt reported that w/ ex she has recognized falling into same pattern of giving and not receiving.  Pt reported that she recognizes need to focus on boundaries for there to be just a friendship.  Pt aware that in her relationships she tends to give and give more than she has or to negative for herself. Pt discussed ways of focus on giving to herself and growth she is seeing for herself this year despite challenges.   Interventions: Cognitive Behavioral Therapy, Insight-Oriented, and  Interpersonal  Diagnosis:Depression, recurrent ([place])  Other mixed anxiety disorders  Plan: Pt to f/u w/ counseling in 2 weeks.  Pt to f/u as scheduled w/ her PCP and specialists.     Treatment Plan 03/05/22  Individualized Treatment Plan Strengths: seeking support, independent  Supports: good relationship w/ daughters. Pt has friends   Fish farm manager of Needs: "become more aware of myself and figure out who I am.  Always have lived for other people what they wanted and how they see me".    Treatment Level:outpatient counseling  Symptoms:anxiety, irritability  Client Treatment Preferences:biweekly counseling   Goals  1. Develop an awareness of how childhood issues have affected and continue to affect one's family life.  Objective  Describe what it was like to grow up in the home environment. Target Date: 2023-03-06 Frequency: Biweekly  Progress: 0 Modality: individual  Related Interventions  Develop the client's family genogram and/or symptom line and help identify patterns of dysfunction within the family. Objective  Describe each family member and identify the role each played within the family. Target Date: 2023-03-06 Frequency: Biweekly  Progress: 0 Modality: individual  Related Interventions  Assist the client in clarifying his/her role within the family and his/her feelings connected to that role. Objective  Identify feelings associated with major traumatic incidents in childhood and with parental child-rearing patterns. Target Date: 2023-03-06 Frequency: Biweekly  Progress: 0 Modality: individual  Related Interventions  Support and encourage the client when he/she begins to express feelings of rage, sadness, fear, and rejection relating to family abuse or  neglect. Objective  Identify how own parenting has been influenced by childhood experiences. Target Date: 2023-03-06 Frequency: Biweekly  Progress: 0 Modality: individual  Related Interventions  Ask the  client to compare his/her parenting behavior to that of parent figures of his/her childhood; encourage the client to be aware of how easily we repeat patterns that we grew up with.   2. Learn and implement coping skills that result in a reduction of anxiety and worry, and improved daily functioning.   3. Reduce overall frequency, intensity, and duration of the anxiety so that daily functioning is not impaired.  Objective  Describe situations, thoughts, feelings, and actions associated with anxieties and worries, their impact on functioning, and attempts to resolve them. Target Date: 2023-03-06 Frequency: Biweekly  Progress: 0 Modality: individual  Related Interventions  Ask the client to describe his/her past experiences of anxiety and their impact on functioning; assess the focus, excessiveness, and uncontrollability of the worry and the type, frequency, intensity, and duration of his/her anxiety symptoms (consider using a structured interview such as The Anxiety Disorders Interview Schedule-Adult Version). Objective  Learn and implement calming skills to reduce overall anxiety and manage anxiety symptoms. Target Date: 2023-03-06 Frequency: Biweekly  Progress: 0 Modality: individual  Related Interventions  Teach the client calming/relaxation skills (e.g., applied relaxation, progressive muscle relaxation, cue controlled relaxation; mindful breathing; biofeedback) and how to discriminate better between relaxation and tension; teach the client how to apply these skills to his/her daily life (e.g., New Directions in Progressive Muscle Relaxation by Casper Harrison, and Hazlett-Stevens; Treating Generalized Anxiety Disorder by Rygh and Amparo Bristol). Assign the client homework each session in which he/she practices relaxation exercises daily, gradually applying them progressively from non-anxiety-provoking to anxiety-provoking situations; review and reinforce success while providing corrective  feedback toward improvement. Assign the client to read about progressive muscle relaxation and other calming strategies in relevant books or treatment manuals (e.g., Progressive Relaxation Training by Gwynneth Aliment and Dani Gobble; Mastery of Your Anxiety and Worry: Workbook by Beckie Busing). Objective  Learn and implement a strategy to limit the association between various environmental settings and worry, delaying the worry until a designated "worry time." Target Date: 2023-03-06 Frequency: Biweekly  Progress: 0 Modality: individual  Related Interventions  Explain the rationale for using a worry time as well as how it is to be used; agree upon and implement a worry time with the client. Teach the client how to recognize, stop, and postpone worry to the agreed upon worry time using skills such as thought stopping, relaxation, and redirecting attention (or assign "Making Use of the Thought-Stopping Technique" and/or "Worry Time" in the Adult Psychotherapy Homework Planner by Jongsma to assist skill development); encourage use in daily life; review and reinforce success while providing corrective feedback toward improvement. Objective  Identify, challenge, and replace biased, fearful self-talk with positive, realistic, and empowering self-talk. Target Date: 2023-03-06 Frequency: Biweekly  Progress: 0 Modality: individual  Related Interventions  Explore the client's schema and self-talk that mediate his/her fear response; assist him/her in challenging the biases; replace the distorted messages with reality-based alternatives and positive, realistic self-talk that will increase his/her self-confidence in coping with irrational fears (see Cognitive Therapy of Anxiety Disorders by Alison Stalling). Assign the client a homework exercise in which he/she identifies fearful self-talk, identifies biases in the self-talk, generates alternatives, and tests through behavioral experiments (or assign "Negative  Thoughts Trigger Negative Feelings" in the Adult Psychotherapy Homework Planner by [facility]); review and reinforce success, providing corrective feedback toward improvement.  Objective  Learn and implement problem-solving strategies for realistically addressing worries. Target Date: 2023-03-06 Frequency: Biweekly  Progress: 0 Modality: individual  Related Interventions  Teach the client problem-solving strategies involving specifically defining a problem, generating options for addressing it, evaluating the pros and cons of each option, selecting and implementing an optional action, and reevaluating and refining the action (or assign "Applying Problem-Solving to Interpersonal Conflict" in the Adult Psychotherapy Homework Planner by Bryn Gulling). Assign the client a homework exercise in which he/she problem-solves a current problem (see Mastery of Your Anxiety and Worry: Workbook by Adora Fridge and Eliot Ford or Generalized Anxiety Disorder by Eather Colas, and Eliot Ford); review, reinforce success, and provide corrective feedback toward improvement. Objective  Identify and engage in pleasant activities on a daily basis. Target Date: 2023-03-06 Frequency: Biweekly  Progress: 0 Modality: individual  Related Interventions  Engage the client in behavioral activation, increasing the client's contact with sources of reward, identifying processes that inhibit activation, and teaching skills to solve life problems (or assign "Identify and Schedule Pleasant Activities" in the Adult Psychotherapy Homework Planner by Jongsma); use behavioral techniques such as instruction, rehearsal, role-playing, role reversal as needed to assist adoption into the client's daily life; reinforce success.  4. Resolve financial crisis with a path to eliminate debt.  Objective  Identify priorities that should control how money is spent. Target Date: 2023-03-06 Frequency: Biweekly  Progress: 0 Modality: individual  Related  Interventions  Ask the client to list the priorities that he/she believes should give direction to how his/her money is spent; process those priorities. Objective  Describe the family-of-origin pattern of money management. Target Date: 2023-03-06 Frequency: Biweekly  Progress: 0 Modality: individual  Related Interventions  Explore the client's family-of-origin patterns of earning, saving, and spending money, focusing on how those patterns are influencing his/her current financial decisions. Objective  Write a budget that balances income with expenses. Target Date: 2023-03-06 Frequency: Biweekly  Progress: 0 Modality: individual  Related Interventions  Review the client's budget as to reasonableness and completeness. If financial planning is needed, refer to a professional planner or ask partners to write a current budget and a long-range savings and investment plan (consider assigning "Plan a Budget" from the Adult Psychotherapy Homework Planner by Bryn Gulling or The Budget Kit: The Common Cents Money Management Workbook by [employer]). Objective  Keep weekly and monthly records of financial income and expenses. Target Date: 2023-03-06 Frequency: Biweekly  Progress: 0 Modality: individual  Related Interventions  Encourage the client to keep a weekly and monthly record of income and outflow; review his/her records weekly, and reinforce his/her responsible financial decision-making. Offer praise and ongoing encouragement of the client's progress toward debt resolution; recommend the client read The Total Money Makeover: A Proven Plan for Financial Fitness by Sherrell Puller). Objective  Set financial goals and make budgetary decisions that lessen anxiety. Target Date: 2023-03-06 Frequency: Biweekly  Progress: 0 Modality: individual  Related Interventions  Reinforce changes in managing money that reflect responsible planning, and following of her budget.  This plan has been reviewed and created  by the following participants:  This plan will be reviewed at least every 12 months. Date Behavioral Health Clinician Date Guardian/Patient   03/25/22 Jan Fireman, [facility] 03/25/22 Verbal Consent Provided                     Jan Fireman, [facility]

## 2022-05-08 ENCOUNTER — Ambulatory Visit (INDEPENDENT_AMBULATORY_CARE_PROVIDER_SITE_OTHER): Payer: 59 | Admitting: Psychology

## 2022-05-08 DIAGNOSIS — F413 Other mixed anxiety disorders: Secondary | ICD-10-CM

## 2022-05-08 DIAGNOSIS — F339 Major depressive disorder, recurrent, unspecified: Secondary | ICD-10-CM

## 2022-05-08 NOTE — Progress Notes (Signed)
[place] Counselor/Therapist Progress Note  Patient ID: Caitlyn Richardson, MRN: [id number],    Date: 05/08/2022  Time Spent: 9:00am-9:59am  Pt is seen for an in person visit. Treatment Type: Individual Therapy  Reported Symptoms: worry w/ return of some pain yesterday and some stressors.  Setting boundaries w/ friendships and family, mindset improving.  Mental Status Exam: Appearance:  Well Groomed     Behavior: Appropriate  Motor: Normal  Speech/Language:  Normal Rate  Affect: Appropriate  Mood: anxious and depressed  Thought process: normal  Thought content:   WNL  Sensory/Perceptual disturbances:   WNL  Orientation: oriented to person, place, time/date, and situation  Attention: Good  Concentration: Good  Memory: WNL  Fund of knowledge:  Good  Insight:   Good  Judgment:  Good  Impulse Control: Good   Risk Assessment: Danger to Self:  No Self-injurious Behavior: No Danger to Others: No Duty to Warn:no Physical Aggression / Violence:No  Access to Firearms a concern: No  Gang Involvement:No   Subjective: Counselor assessed pt current functioning per pt report.  Processed w/ pt positives, stressors and mood.  Explored w/pt things that are worries and assisting pt in focus on what she knows/facts, present actions can take and reminding self of that.  Explored w/pt positives of boundaries setting for self and focusing on what is priority and important for her.  Pt affect wnl.  Pt reported some stressors at new year- water out at apartment, others trying to worry her.  Pt reported on financial stressors and decision making.  Pt informed some pain in leg returned yesterday which concern w/ her return to work date and did put call into her doctor re:Marland Kitchen  Pt recongized that she is taking better steps for self and making decisions for healthy boundaries.  Pt reiterated better mindset for self.     Interventions: Cognitive Behavioral Therapy, Insight-Oriented, and  Interpersonal  Diagnosis:Depression, recurrent ([place])  Other mixed anxiety disorders  Plan: Pt to f/u w/ counseling in 2 weeks.  Pt to f/u as scheduled w/ her PCP and specialists.     Treatment Plan 03/05/22  Individualized Treatment Plan Strengths: seeking support, independent  Supports: good relationship w/ daughters. Pt has friends   Fish farm manager of Needs: "become more aware of myself and figure out who I am.  Always have lived for other people what they wanted and how they see me".    Treatment Level:outpatient counseling  Symptoms:anxiety, irritability  Client Treatment Preferences:biweekly counseling   Goals  1. Develop an awareness of how childhood issues have affected and continue to affect one's family life.  Objective  Describe what it was like to grow up in the home environment. Target Date: 2023-03-06 Frequency: Biweekly  Progress: 0 Modality: individual  Related Interventions  Develop the client's family genogram and/or symptom line and help identify patterns of dysfunction within the family. Objective  Describe each family member and identify the role each played within the family. Target Date: 2023-03-06 Frequency: Biweekly  Progress: 0 Modality: individual  Related Interventions  Assist the client in clarifying his/her role within the family and his/her feelings connected to that role. Objective  Identify feelings associated with major traumatic incidents in childhood and with parental child-rearing patterns. Target Date: 2023-03-06 Frequency: Biweekly  Progress: 0 Modality: individual  Related Interventions  Support and encourage the client when he/she begins to express feelings of rage, sadness, fear, and rejection relating to family abuse or neglect. Objective  Identify how own parenting has  been influenced by childhood experiences. Target Date: 2023-03-06 Frequency: Biweekly  Progress: 0 Modality: individual  Related Interventions  Ask the  client to compare his/her parenting behavior to that of parent figures of his/her childhood; encourage the client to be aware of how easily we repeat patterns that we grew up with.   2. Learn and implement coping skills that result in a reduction of anxiety and worry, and improved daily functioning.   3. Reduce overall frequency, intensity, and duration of the anxiety so that daily functioning is not impaired.  Objective  Describe situations, thoughts, feelings, and actions associated with anxieties and worries, their impact on functioning, and attempts to resolve them. Target Date: 2023-03-06 Frequency: Biweekly  Progress: 0 Modality: individual  Related Interventions  Ask the client to describe his/her past experiences of anxiety and their impact on functioning; assess the focus, excessiveness, and uncontrollability of the worry and the type, frequency, intensity, and duration of his/her anxiety symptoms (consider using a structured interview such as The Anxiety Disorders Interview Schedule-Adult Version). Objective  Learn and implement calming skills to reduce overall anxiety and manage anxiety symptoms. Target Date: 2023-03-06 Frequency: Biweekly  Progress: 0 Modality: individual  Related Interventions  Teach the client calming/relaxation skills (e.g., applied relaxation, progressive muscle relaxation, cue controlled relaxation; mindful breathing; biofeedback) and how to discriminate better between relaxation and tension; teach the client how to apply these skills to his/her daily life (e.g., New Directions in Progressive Muscle Relaxation by Casper Harrison, and Hazlett-Stevens; Treating Generalized Anxiety Disorder by Rygh and Amparo Bristol). Assign the client homework each session in which he/she practices relaxation exercises daily, gradually applying them progressively from non-anxiety-provoking to anxiety-provoking situations; review and reinforce success while providing corrective  feedback toward improvement. Assign the client to read about progressive muscle relaxation and other calming strategies in relevant books or treatment manuals (e.g., Progressive Relaxation Training by Gwynneth Aliment and Dani Gobble; Mastery of Your Anxiety and Worry: Workbook by Beckie Busing). Objective  Learn and implement a strategy to limit the association between various environmental settings and worry, delaying the worry until a designated "worry time." Target Date: 2023-03-06 Frequency: Biweekly  Progress: 0 Modality: individual  Related Interventions  Explain the rationale for using a worry time as well as how it is to be used; agree upon and implement a worry time with the client. Teach the client how to recognize, stop, and postpone worry to the agreed upon worry time using skills such as thought stopping, relaxation, and redirecting attention (or assign "Making Use of the Thought-Stopping Technique" and/or "Worry Time" in the Adult Psychotherapy Homework Planner by Jongsma to assist skill development); encourage use in daily life; review and reinforce success while providing corrective feedback toward improvement. Objective  Identify, challenge, and replace biased, fearful self-talk with positive, realistic, and empowering self-talk. Target Date: 2023-03-06 Frequency: Biweekly  Progress: 0 Modality: individual  Related Interventions  Explore the client's schema and self-talk that mediate his/her fear response; assist him/her in challenging the biases; replace the distorted messages with reality-based alternatives and positive, realistic self-talk that will increase his/her self-confidence in coping with irrational fears (see Cognitive Therapy of Anxiety Disorders by Alison Stalling). Assign the client a homework exercise in which he/she identifies fearful self-talk, identifies biases in the self-talk, generates alternatives, and tests through behavioral experiments (or assign "Negative  Thoughts Trigger Negative Feelings" in the Adult Psychotherapy Homework Planner by [facility]); review and reinforce success, providing corrective feedback toward improvement. Objective  Learn and implement problem-solving strategies for  realistically addressing worries. Target Date: 2023-03-06 Frequency: Biweekly  Progress: 0 Modality: individual  Related Interventions  Teach the client problem-solving strategies involving specifically defining a problem, generating options for addressing it, evaluating the pros and cons of each option, selecting and implementing an optional action, and reevaluating and refining the action (or assign "Applying Problem-Solving to Interpersonal Conflict" in the Adult Psychotherapy Homework Planner by Bryn Gulling). Assign the client a homework exercise in which he/she problem-solves a current problem (see Mastery of Your Anxiety and Worry: Workbook by Adora Fridge and Eliot Ford or Generalized Anxiety Disorder by Eather Colas, and Eliot Ford); review, reinforce success, and provide corrective feedback toward improvement. Objective  Identify and engage in pleasant activities on a daily basis. Target Date: 2023-03-06 Frequency: Biweekly  Progress: 0 Modality: individual  Related Interventions  Engage the client in behavioral activation, increasing the client's contact with sources of reward, identifying processes that inhibit activation, and teaching skills to solve life problems (or assign "Identify and Schedule Pleasant Activities" in the Adult Psychotherapy Homework Planner by Jongsma); use behavioral techniques such as instruction, rehearsal, role-playing, role reversal as needed to assist adoption into the client's daily life; reinforce success.  4. Resolve financial crisis with a path to eliminate debt.  Objective  Identify priorities that should control how money is spent. Target Date: 2023-03-06 Frequency: Biweekly  Progress: 0 Modality: individual  Related  Interventions  Ask the client to list the priorities that he/she believes should give direction to how his/her money is spent; process those priorities. Objective  Describe the family-of-origin pattern of money management. Target Date: 2023-03-06 Frequency: Biweekly  Progress: 0 Modality: individual  Related Interventions  Explore the client's family-of-origin patterns of earning, saving, and spending money, focusing on how those patterns are influencing his/her current financial decisions. Objective  Write a budget that balances income with expenses. Target Date: 2023-03-06 Frequency: Biweekly  Progress: 0 Modality: individual  Related Interventions  Review the client's budget as to reasonableness and completeness. If financial planning is needed, refer to a professional planner or ask partners to write a current budget and a long-range savings and investment plan (consider assigning "Plan a Budget" from the Adult Psychotherapy Homework Planner by Bryn Gulling or The Budget Kit: The Common Cents Money Management Workbook by [employer]). Objective  Keep weekly and monthly records of financial income and expenses. Target Date: 2023-03-06 Frequency: Biweekly  Progress: 0 Modality: individual  Related Interventions  Encourage the client to keep a weekly and monthly record of income and outflow; review his/her records weekly, and reinforce his/her responsible financial decision-making. Offer praise and ongoing encouragement of the client's progress toward debt resolution; recommend the client read The Total Money Makeover: A Proven Plan for Financial Fitness by Sherrell Puller). Objective  Set financial goals and make budgetary decisions that lessen anxiety. Target Date: 2023-03-06 Frequency: Biweekly  Progress: 0 Modality: individual  Related Interventions  Reinforce changes in managing money that reflect responsible planning, and following of her budget.  This plan has been reviewed and created  by the following participants:  This plan will be reviewed at least every 12 months. Date Behavioral Health Clinician Date Guardian/Patient   03/25/22 Jan Fireman, [facility] 03/25/22 Verbal Consent Provided                       Jan Fireman, [facility]

## 2022-05-18 ENCOUNTER — Other Ambulatory Visit (HOSPITAL_BASED_OUTPATIENT_CLINIC_OR_DEPARTMENT_OTHER): Payer: Self-pay | Admitting: Nurse Practitioner

## 2022-05-18 DIAGNOSIS — F4323 Adjustment disorder with mixed anxiety and depressed mood: Secondary | ICD-10-CM

## 2022-05-19 ENCOUNTER — Other Ambulatory Visit: Payer: Self-pay

## 2022-05-19 ENCOUNTER — Other Ambulatory Visit (HOSPITAL_COMMUNITY): Payer: Self-pay

## 2022-05-19 MED ORDER — HYDROXYZINE PAMOATE 25 MG PO CAPS
25.0000 mg | ORAL_CAPSULE | Freq: Every evening | ORAL | 3 refills | Status: DC | PRN
  Filled 2022-05-19: qty 180, 90d supply, fill #0

## 2022-05-20 ENCOUNTER — Ambulatory Visit: Payer: 59 | Admitting: Psychology

## 2022-05-27 ENCOUNTER — Other Ambulatory Visit (HOSPITAL_COMMUNITY): Payer: Self-pay | Admitting: Neurosurgery

## 2022-05-27 DIAGNOSIS — M5127 Other intervertebral disc displacement, lumbosacral region: Secondary | ICD-10-CM

## 2022-06-04 ENCOUNTER — Ambulatory Visit (HOSPITAL_COMMUNITY)
Admission: RE | Admit: 2022-06-04 | Discharge: 2022-06-04 | Disposition: A | Discharge status: Home / Self Care | Payer: 59 | Source: Ambulatory Visit | Attending: Neurosurgery | Admitting: Neurosurgery

## 2022-06-04 DIAGNOSIS — M5127 Other intervertebral disc displacement, lumbosacral region: Secondary | ICD-10-CM | POA: Diagnosis not present

## 2022-06-04 MED ORDER — GADOBUTROL 1 MMOL/ML IV SOLN
8.0000 mL | Freq: Once | INTRAVENOUS | Status: AC | PRN
  Administered 2022-06-04: 8 mL via INTRAVENOUS

## 2022-06-20 ENCOUNTER — Other Ambulatory Visit (HOSPITAL_COMMUNITY): Payer: Self-pay

## 2022-07-16 ENCOUNTER — Ambulatory Visit
Admission: RE | Admit: 2022-07-16 | Discharge: 2022-07-16 | Disposition: A | Discharge status: Home / Self Care | Payer: 59 | Source: Ambulatory Visit | Attending: Obstetrics & Gynecology | Admitting: Obstetrics & Gynecology

## 2022-07-16 DIAGNOSIS — R921 Mammographic calcification found on diagnostic imaging of breast: Secondary | ICD-10-CM

## 2022-07-18 ENCOUNTER — Other Ambulatory Visit: Payer: Self-pay | Admitting: Obstetrics & Gynecology

## 2022-07-18 DIAGNOSIS — R921 Mammographic calcification found on diagnostic imaging of breast: Secondary | ICD-10-CM

## 2022-07-29 ENCOUNTER — Ambulatory Visit (INDEPENDENT_AMBULATORY_CARE_PROVIDER_SITE_OTHER): Payer: 59

## 2022-07-29 ENCOUNTER — Ambulatory Visit (INDEPENDENT_AMBULATORY_CARE_PROVIDER_SITE_OTHER): Payer: 59 | Admitting: Family Medicine

## 2022-07-29 ENCOUNTER — Encounter (HOSPITAL_BASED_OUTPATIENT_CLINIC_OR_DEPARTMENT_OTHER): Payer: Self-pay | Admitting: Family Medicine

## 2022-07-29 VITALS — BP 131/82 | HR 73 | Ht 66.5 in | Wt 186.6 lb

## 2022-07-29 DIAGNOSIS — M25512 Pain in left shoulder: Secondary | ICD-10-CM | POA: Insufficient documentation

## 2022-07-29 DIAGNOSIS — I10 Essential (primary) hypertension: Secondary | ICD-10-CM | POA: Diagnosis not present

## 2022-07-29 DIAGNOSIS — R21 Rash and other nonspecific skin eruption: Secondary | ICD-10-CM

## 2022-07-29 NOTE — Assessment & Plan Note (Signed)
She indicates having rash on face which has been present for some time, however feels that it has been worsening to degree and she is requesting referral to dermatologist for further evaluation and recommendations. Patient request, can proceed with referral to dermatologist locally, order placed today

## 2022-07-29 NOTE — Progress Notes (Signed)
    Procedures performed today:    None.  Independent interpretation of notes and tests performed by another provider:   None.  Brief History, Exam, Impression, and Recommendations:    BP 131/82 (BP Location: Left Arm, Patient Position: Sitting, Cuff Size: Large)   Pulse 73   Ht 5' 6.5" (1.689 m)   Wt 186 lb 9.6 oz (84.6 kg)   LMP 09/22/2021 (Approximate)   SpO2 100%   BMI 29.67 kg/m   Essential hypertension Blood pressure at goal in office today.  She continues with amlodipine and olmesartan-hydrochlorothiazide.  No issues medication. Given good control blood pressure, recommend continuing current regimen.  Recommend intermittent monitoring at home, DASH diet  Rash of face She indicates having rash on face which has been present for some time, however feels that it has been worsening to degree and she is requesting referral to dermatologist for further evaluation and recommendations. Patient request, can proceed with referral to dermatologist locally, order placed today  Left shoulder pain Acute on chronic issue for patient.  She has been having intermittent left shoulder pain for some time, typically symptoms will last for short duration of several days or so.  Current symptoms have persisted somewhat longer, thus leading to discussion today.  Pain is primarily over anterior shoulder.  She reports that she is right-handed.  Denies any prior injuries to left shoulder, no recent aggravating factors reported.  Pain is worse with overhead motion. On exam, Left shoulder: Obvious swelling, bruising or erythema: absent Deformity of the shoulder: absent There is tenderness to palpation over bicipital groove Active ROM: full range with pain Passive ROM: full range of motion Strength: normal/normal Empty can: Negative Hawkins: Negative Neer's: Positive Mild pain with resisted supination Neurovascular exam: intact  Exam suggests possible biceps tendinitis, potential subacromial  impingement as well.  Discussed treatment options.  Recommend proceeding with initial x-ray imaging given chronicity of symptoms.  Additionally, feel patient would benefit from physical therapy, referral placed today.  Could also consider injection, would initially focus on targeting biceps tendon sheath.  She would prefer to hold off on injection for now which is reasonable.  If symptoms do persist despite above, consider injection therapy, consider advanced imaging.  Return in about 2 months (around 09/28/2022).   ___________________________________________ Saje Gallop de [place], MD, ABFM, CAQSM [facility] [place]

## 2022-07-29 NOTE — Assessment & Plan Note (Signed)
Blood pressure at goal in office today.  She continues with amlodipine and olmesartan-hydrochlorothiazide.  No issues medication. Given good control blood pressure, recommend continuing current regimen.  Recommend intermittent monitoring at home, DASH diet

## 2022-07-29 NOTE — Assessment & Plan Note (Signed)
Acute on chronic issue for patient.  She has been having intermittent left shoulder pain for some time, typically symptoms will last for short duration of several days or so.  Current symptoms have persisted somewhat longer, thus leading to discussion today.  Pain is primarily over anterior shoulder.  She reports that she is right-handed.  Denies any prior injuries to left shoulder, no recent aggravating factors reported.  Pain is worse with overhead motion. On exam, Left shoulder: Obvious swelling, bruising or erythema: absent Deformity of the shoulder: absent There is tenderness to palpation over bicipital groove Active ROM: full range with pain Passive ROM: full range of motion Strength: normal/normal Empty can: Negative Hawkins: Negative Neer's: Positive Mild pain with resisted supination Neurovascular exam: intact  Exam suggests possible biceps tendinitis, potential subacromial impingement as well.  Discussed treatment options.  Recommend proceeding with initial x-ray imaging given chronicity of symptoms.  Additionally, feel patient would benefit from physical therapy, referral placed today.  Could also consider injection, would initially focus on targeting biceps tendon sheath.  She would prefer to hold off on injection for now which is reasonable.  If symptoms do persist despite above, consider injection therapy, consider advanced imaging.

## 2022-08-21 ENCOUNTER — Encounter: Payer: Self-pay | Admitting: Physical Therapy

## 2022-08-21 ENCOUNTER — Ambulatory Visit: Payer: 59 | Attending: Family Medicine | Admitting: Physical Therapy

## 2022-08-21 ENCOUNTER — Other Ambulatory Visit: Payer: Self-pay

## 2022-08-21 DIAGNOSIS — G8929 Other chronic pain: Secondary | ICD-10-CM | POA: Insufficient documentation

## 2022-08-21 DIAGNOSIS — M25512 Pain in left shoulder: Secondary | ICD-10-CM | POA: Insufficient documentation

## 2022-08-21 DIAGNOSIS — M6281 Muscle weakness (generalized): Secondary | ICD-10-CM | POA: Insufficient documentation
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
## 2022-08-21 NOTE — Therapy (Signed)
Eval started, but pt was advised 15 min in that she would not be covered as her insurance was inactive. Pt requested to terminate the appt and left. Pt encouraged to follow up with her insurance and come back when she feels comfortable.

## 2022-10-01 ENCOUNTER — Ambulatory Visit (HOSPITAL_BASED_OUTPATIENT_CLINIC_OR_DEPARTMENT_OTHER): Payer: 59 | Admitting: Family Medicine

## 2022-10-14 ENCOUNTER — Other Ambulatory Visit (HOSPITAL_BASED_OUTPATIENT_CLINIC_OR_DEPARTMENT_OTHER): Payer: Self-pay

## 2022-10-14 ENCOUNTER — Encounter (HOSPITAL_BASED_OUTPATIENT_CLINIC_OR_DEPARTMENT_OTHER): Payer: Self-pay | Admitting: Family Medicine

## 2022-10-14 ENCOUNTER — Ambulatory Visit (HOSPITAL_BASED_OUTPATIENT_CLINIC_OR_DEPARTMENT_OTHER): Payer: 59 | Admitting: Family Medicine

## 2022-10-14 DIAGNOSIS — E538 Deficiency of other specified B group vitamins: Secondary | ICD-10-CM

## 2022-10-14 DIAGNOSIS — E876 Hypokalemia: Secondary | ICD-10-CM | POA: Diagnosis not present

## 2022-10-14 DIAGNOSIS — Z8249 Family history of ischemic heart disease and other diseases of the circulatory system: Secondary | ICD-10-CM

## 2022-10-14 DIAGNOSIS — R079 Chest pain, unspecified: Secondary | ICD-10-CM | POA: Insufficient documentation

## 2022-10-14 DIAGNOSIS — E559 Vitamin D deficiency, unspecified: Secondary | ICD-10-CM

## 2022-10-14 DIAGNOSIS — F339 Major depressive disorder, recurrent, unspecified: Secondary | ICD-10-CM | POA: Diagnosis not present

## 2022-10-14 MED ORDER — TRAZODONE HCL 50 MG PO TABS
25.0000 mg | ORAL_TABLET | Freq: Every day | ORAL | 1 refills | Status: DC
Start: 2022-10-14 — End: 2023-06-11
  Filled 2022-10-14: qty 30, 60d supply, fill #0

## 2022-10-14 NOTE — Assessment & Plan Note (Signed)
Noted in the past, has not been using vitamin D supplementation recently.  We can update labs today and proceed with supplementation as indicated

## 2022-10-14 NOTE — Assessment & Plan Note (Signed)
Observed on labs during recent emergency department evaluation.  She indicates that she has taken some potassium supplements since that time.  We can proceed with recheck of labs today

## 2022-10-14 NOTE — Patient Instructions (Signed)
  Medication Instructions:  Your physician recommends that you continue on your current medications as directed. Please refer to the Current Medication list given to you today. --If you need a refill on any your medications before your next appointment, please call your pharmacy first. If no refills are authorized on file call the office.-- Lab Work: Your physician has recommended that you have lab work today: Yes If you have labs (blood work) drawn today and your tests are completely normal, you will receive your results via MyChart message OR a phone call from our staff.  Please ensure you check your voicemail in the event that you authorized detailed messages to be left on a delegated number. If you have any lab test that is abnormal or we need to change your treatment, we will call you to review the results.  Referrals/Procedures/Imaging: Yes  Follow-Up: Your next appointment:   Your physician recommends that you schedule a follow-up appointment in: 6 weeks with Dr. de [place].  You will receive a text message or e-mail with a link to a survey about your care and experience with [place] today! We would greatly appreciate your feedback!   Thanks for letting us be apart of your health journey!!  Primary Care and Sports Medicine   Dr. Ceasar Mons [place]   We encourage you to activate your patient portal called "MyChart".  Sign up information is provided on this After Visit Summary.  MyChart is used to connect with patients for Virtual Visits (Telemedicine).  Patients are able to view lab/test results, encounter notes, upcoming appointments, etc.  Non-urgent messages can be sent to your provider as well. To learn more about what you can do with MyChart, please visit --  [email or web address].

## 2022-10-14 NOTE — Assessment & Plan Note (Signed)
Has had new life circumstances/stressors occur with recent passing of her father as well as cousin.  She previously was managing well with use of Effexor and use of Xanax as needed, which previously was fairly infrequent.  She was also utilizing hydroxyzine to help with sleep at night.  She reports that sleep has been negatively affected with these changes, does not find the hydroxyzine to be very helpful.  She has engaged in counseling and therapy in the past, however has struggled to find a good fit in this regard. We discussed options today pertaining to medication management, establishing with new counselor/therapist, consideration for meeting with specialist regarding medication management. At this time, we will allow for trial of low-dose of trazodone and monitor response to this, patient feels that she is uses in the past, however does not recall how she did with this medication.  We can continue with Effexor at current dose. Referral placed today for patient to establish with new counseling/therapist office.  She would also be interested in meeting with a psychiatrist.  Referral placed for this.  Referrals were sent to the same office where both medication management and counseling are available.

## 2022-10-14 NOTE — Assessment & Plan Note (Signed)
Patient had recent visit to emergency department related to chest pain.  Evaluation at that time was generally reassuring and it was felt that cardiac etiology was less likely.  She has been having other ongoing life stressors and circumstances as discussed below which could be contributing to the symptoms.  She does have family history of heart disease and as such had been suggested to have evaluation cardiology during most recent chest pain evaluation.  She continues to remain chest pain-free at this time. We discussed considerations today, feel it would be reasonable to have evaluation with cardiology, although in reviewing prior workup, I do feel it is less likely that etiology of symptoms would be cardiac related.  Referral to cardiology placed today

## 2022-10-14 NOTE — Progress Notes (Signed)
    Procedures performed today:    None.  Independent interpretation of notes and tests performed by another provider:   None.  Brief History, Exam, Impression, and Recommendations:    BP (!) 151/90 (BP Location: Right Arm, Patient Position: Sitting, Cuff Size: Normal)   Pulse 65   Ht 5' 6.5" (1.689 m)   Wt 190 lb 4.8 oz (86.3 kg)   LMP 09/22/2021 (Approximate)   SpO2 100%   BMI 30.26 kg/m   Chest pain Patient had recent visit to emergency department related to chest pain.  Evaluation at that time was generally reassuring and it was felt that cardiac etiology was less likely.  She has been having other ongoing life stressors and circumstances as discussed below which could be contributing to the symptoms.  She does have family history of heart disease and as such had been suggested to have evaluation cardiology during most recent chest pain evaluation.  She continues to remain chest pain-free at this time. We discussed considerations today, feel it would be reasonable to have evaluation with cardiology, although in reviewing prior workup, I do feel it is less likely that etiology of symptoms would be cardiac related.  Referral to cardiology placed today  Hypokalemia Observed on labs during recent emergency department evaluation.  She indicates that she has taken some potassium supplements since that time.  We can proceed with recheck of labs today  Vitamin D deficiency Noted in the past, has not been using vitamin D supplementation recently.  We can update labs today and proceed with supplementation as indicated  Depression, recurrent (HCC) Has had new life circumstances/stressors occur with recent passing of her father as well as cousin.  She previously was managing well with use of Effexor and use of Xanax as needed, which previously was fairly infrequent.  She was also utilizing hydroxyzine to help with sleep at night.  She reports that sleep has been negatively affected with these  changes, does not find the hydroxyzine to be very helpful.  She has engaged in counseling and therapy in the past, however has struggled to find a good fit in this regard. We discussed options today pertaining to medication management, establishing with new counselor/therapist, consideration for meeting with specialist regarding medication management. At this time, we will allow for trial of low-dose of trazodone and monitor response to this, patient feels that she is uses in the past, however does not recall how she did with this medication.  We can continue with Effexor at current dose. Referral placed today for patient to establish with new counseling/therapist office.  She would also be interested in meeting with a psychiatrist.  Referral placed for this.  Referrals were sent to the same office where both medication management and counseling are available.  Return in about 6 weeks (around 11/25/2022) for referrals, labs, meds.   ___________________________________________ Coralynn Gaona de [place], MD, ABFM, CAQSM Primary Care and Sports Medicine [facility]

## 2022-10-15 LAB — VITAMIN D 25 HYDROXY (VIT D DEFICIENCY, FRACTURES): Vit D, 25-Hydroxy: 18.6 ng/mL — ABNORMAL LOW (ref 30.0–100.0)

## 2022-10-15 LAB — BASIC METABOLIC PANEL
BUN/Creatinine Ratio: 19 (ref 9–23)
BUN: 15 mg/dL (ref 6–24)
CO2: 26 mmol/L (ref 20–29)
Calcium: 10.6 mg/dL — ABNORMAL HIGH (ref 8.7–10.2)
Chloride: 103 mmol/L (ref 96–106)
Creatinine, Ser: 0.81 mg/dL (ref 0.57–1.00)
Glucose: 71 mg/dL (ref 70–99)
Potassium: 3.7 mmol/L (ref 3.5–5.2)
Sodium: 141 mmol/L (ref 134–144)
eGFR: 92 mL/min/{1.73_m2} (ref >59)

## 2022-10-15 LAB — VITAMIN B12: Vitamin B-12: 335 pg/mL (ref 232–1245)

## 2022-10-15 LAB — TSH RFX ON ABNORMAL TO FREE T4: TSH: 2.63 u[IU]/mL (ref 0.450–4.500)

## 2022-10-17 ENCOUNTER — Other Ambulatory Visit (HOSPITAL_BASED_OUTPATIENT_CLINIC_OR_DEPARTMENT_OTHER): Payer: Self-pay | Admitting: *Deleted

## 2022-10-17 DIAGNOSIS — E538 Deficiency of other specified B group vitamins: Secondary | ICD-10-CM

## 2022-10-17 DIAGNOSIS — E876 Hypokalemia: Secondary | ICD-10-CM

## 2022-10-17 DIAGNOSIS — E559 Vitamin D deficiency, unspecified: Secondary | ICD-10-CM

## 2022-10-21 LAB — SPECIMEN STATUS REPORT

## 2022-10-21 LAB — PTH, INTACT AND CALCIUM: Calcium: 10.4 mg/dL — ABNORMAL HIGH (ref 8.7–10.2)

## 2022-10-23 ENCOUNTER — Other Ambulatory Visit (HOSPITAL_BASED_OUTPATIENT_CLINIC_OR_DEPARTMENT_OTHER): Payer: Self-pay | Admitting: Family Medicine

## 2022-10-28 ENCOUNTER — Encounter: Payer: Self-pay | Admitting: Obstetrics & Gynecology

## 2022-11-03 DIAGNOSIS — H40023 Open angle with borderline findings, high risk, bilateral: Secondary | ICD-10-CM | POA: Diagnosis not present

## 2022-11-03 DIAGNOSIS — H5213 Myopia, bilateral: Secondary | ICD-10-CM | POA: Diagnosis not present

## 2022-11-04 ENCOUNTER — Ambulatory Visit (HOSPITAL_BASED_OUTPATIENT_CLINIC_OR_DEPARTMENT_OTHER): Payer: 59 | Admitting: Obstetrics & Gynecology

## 2022-11-05 ENCOUNTER — Encounter (HOSPITAL_BASED_OUTPATIENT_CLINIC_OR_DEPARTMENT_OTHER): Payer: Self-pay | Admitting: Family Medicine

## 2022-11-15 ENCOUNTER — Other Ambulatory Visit (HOSPITAL_COMMUNITY): Payer: Self-pay

## 2022-11-25 ENCOUNTER — Encounter (HOSPITAL_BASED_OUTPATIENT_CLINIC_OR_DEPARTMENT_OTHER): Payer: Self-pay | Admitting: Obstetrics & Gynecology

## 2022-11-25 ENCOUNTER — Other Ambulatory Visit (HOSPITAL_BASED_OUTPATIENT_CLINIC_OR_DEPARTMENT_OTHER): Payer: Self-pay

## 2022-11-25 ENCOUNTER — Ambulatory Visit (HOSPITAL_BASED_OUTPATIENT_CLINIC_OR_DEPARTMENT_OTHER): Payer: 59 | Admitting: Family Medicine

## 2022-11-25 ENCOUNTER — Other Ambulatory Visit (HOSPITAL_COMMUNITY)
Admission: RE | Admit: 2022-11-25 | Discharge: 2022-11-25 | Disposition: A | Discharge status: Home / Self Care | Payer: 59 | Source: Ambulatory Visit | Attending: Obstetrics & Gynecology | Admitting: Obstetrics & Gynecology

## 2022-11-25 ENCOUNTER — Ambulatory Visit (INDEPENDENT_AMBULATORY_CARE_PROVIDER_SITE_OTHER): Payer: 59 | Admitting: Obstetrics & Gynecology

## 2022-11-25 VITALS — BP 133/82 | HR 67 | Ht 66.5 in | Wt 193.6 lb

## 2022-11-25 DIAGNOSIS — L821 Other seborrheic keratosis: Secondary | ICD-10-CM | POA: Diagnosis not present

## 2022-11-25 DIAGNOSIS — N898 Other specified noninflammatory disorders of vagina: Secondary | ICD-10-CM

## 2022-11-25 DIAGNOSIS — N9089 Other specified noninflammatory disorders of vulva and perineum: Secondary | ICD-10-CM | POA: Insufficient documentation
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
## 2022-11-26 ENCOUNTER — Encounter (HOSPITAL_BASED_OUTPATIENT_CLINIC_OR_DEPARTMENT_OTHER): Payer: Self-pay | Admitting: Obstetrics & Gynecology

## 2022-11-26 LAB — CERVICOVAGINAL ANCILLARY ONLY
Bacterial Vaginitis (gardnerella): POSITIVE — AB
Candida Glabrata: NEGATIVE
Candida Vaginitis: NEGATIVE
Chlamydia: NEGATIVE
Comment: NEGATIVE
Comment: NEGATIVE
Comment: NEGATIVE
Comment: NEGATIVE
Comment: NEGATIVE
Comment: NORMAL
Neisseria Gonorrhea: NEGATIVE
Trichomonas: NEGATIVE

## 2022-11-26 LAB — PTH, INTACT AND CALCIUM
Calcium: 9.1 mg/dL (ref 8.7–10.2)
PTH: 22 pg/mL (ref 15–65)

## 2022-11-27 ENCOUNTER — Other Ambulatory Visit (HOSPITAL_BASED_OUTPATIENT_CLINIC_OR_DEPARTMENT_OTHER): Payer: Self-pay

## 2022-11-27 ENCOUNTER — Other Ambulatory Visit (HOSPITAL_BASED_OUTPATIENT_CLINIC_OR_DEPARTMENT_OTHER): Payer: Self-pay | Admitting: *Deleted

## 2022-11-27 LAB — SURGICAL PATHOLOGY

## 2022-11-27 MED ORDER — METRONIDAZOLE 500 MG PO TABS
500.0000 mg | ORAL_TABLET | Freq: Two times a day (BID) | ORAL | 0 refills | Status: AC
  Filled 2022-11-27: qty 14, 7d supply, fill #0

## 2022-11-27 NOTE — Progress Notes (Signed)
Rx sent to pharmacy for treatment of BV 

## 2022-12-03 ENCOUNTER — Telehealth (HOSPITAL_BASED_OUTPATIENT_CLINIC_OR_DEPARTMENT_OTHER): Payer: Self-pay | Admitting: *Deleted

## 2022-12-03 NOTE — Telephone Encounter (Signed)
Patient called back and left a message for Selena Batten to call her back.

## 2022-12-04 ENCOUNTER — Ambulatory Visit (HOSPITAL_BASED_OUTPATIENT_CLINIC_OR_DEPARTMENT_OTHER): Payer: 59 | Admitting: Family Medicine

## 2022-12-04 ENCOUNTER — Other Ambulatory Visit (HOSPITAL_BASED_OUTPATIENT_CLINIC_OR_DEPARTMENT_OTHER): Payer: Self-pay

## 2022-12-04 ENCOUNTER — Encounter (HOSPITAL_BASED_OUTPATIENT_CLINIC_OR_DEPARTMENT_OTHER): Payer: Self-pay | Admitting: Family Medicine

## 2022-12-04 ENCOUNTER — Ambulatory Visit (HOSPITAL_BASED_OUTPATIENT_CLINIC_OR_DEPARTMENT_OTHER): Payer: 59 | Admitting: Obstetrics & Gynecology

## 2022-12-04 DIAGNOSIS — E559 Vitamin D deficiency, unspecified: Secondary | ICD-10-CM

## 2022-12-04 DIAGNOSIS — R400 Somnolence: Secondary | ICD-10-CM | POA: Diagnosis not present

## 2022-12-04 MED ORDER — VITAMIN D3 25 MCG (1000 UNIT) PO TABS
1000.0000 [IU] | ORAL_TABLET | Freq: Every day | ORAL | 1 refills | Status: DC
Start: 2022-12-04 — End: 2023-08-01
  Filled 2022-12-04: qty 100, 100d supply, fill #0
  Filled 2023-04-13: qty 100, 100d supply, fill #1

## 2022-12-04 NOTE — Progress Notes (Signed)
    Procedures performed today:    None.  Independent interpretation of notes and tests performed by another provider:   None.  Brief History, Exam, Impression, and Recommendations:    BP 136/85 (BP Location: Left Arm, Patient Position: Sitting, Cuff Size: Normal)   Pulse 66   Ht 5' 6.5" (1.689 m)   Wt 265 lb (120.2 kg)   LMP 09/22/2021 (Approximate)   SpO2 100%   BMI 42.13 kg/m   Vitamin D deficiency Assessment & Plan: Noted on prior labs with recommendation for vitamin D supplementation.  She has not been taking any vitamin D supplement currently.  Discussed prior lab findings and recommendations, prescription sent for vitamin D supplement for patient to compare to prior with over-the-counter option. Plan to recheck vitamin D level around time of next office visit in a few months  Orders: -     Vitamin D3; Take 1 tablet (1,000 Units total) by mouth daily.  Dispense: 100 tablet; Refill: 1 -     VITAMIN D 25 Hydroxy (Vit-D Deficiency, Fractures); Future  Daytime somnolence -     Ambulatory referral to Pulmonology  Patient has been having occasional headaches.  Has tried Tylenol without significant relief of symptoms.  Has been more noticeable over recent months.  As above, she does have underlying concerns related to mental health as well as possible issues with sleep apnea.  Not aware of any specific triggers.  No thunderclap headache.  No nighttime awakenings due to headache.  No other red flags on history Discussed options, feel it would be prudent to continue with referrals and further evaluation pertaining to possible sleep apnea this could certainly exacerbate any underlying headache syndrome.  Additionally, recommend further evaluation with psychiatrist. Discussed role of headache diary, recommend keeping a log of headaches, pregnancy, severity, any interventions tried and whether they helped or not, dietary intake, sleep patterns.  Additionally can utilize ibuprofen in  conjunction with Tylenol  Return in about 3 months (around 03/06/2023).  Spent 32 minutes on this patient encounter, including preparation, chart review, face-to-face counseling with patient and coordination of care, and documentation of encounter   ___________________________________________ Kimley Apsey de Peru, MD, ABFM, [facility] Primary [facility] and Sports Medicine [facility]

## 2022-12-04 NOTE — Assessment & Plan Note (Signed)
Patient has been having some increased fatigue and has not been feeling rested upon waking despite feeling that she has been getting enough hours of sleep.  She does have ongoing concerns related to depressive symptoms, is in process of scheduling appointments with psychiatry as well as counselor.  We did also discuss possibility of underlying sleep apnea.  Epworth Sleepiness Scale with score of 6 today which would suggest that this is possibly less likely.  We discussed options and patient amenable to meeting with sleep medicine specialist to further review and determine if sleep study would be appropriate Referral placed today

## 2022-12-04 NOTE — Assessment & Plan Note (Signed)
Noted on prior labs with recommendation for vitamin D supplementation.  She has not been taking any vitamin D supplement currently.  Discussed prior lab findings and recommendations, prescription sent for vitamin D supplement for patient to compare to prior with over-the-counter option. Plan to recheck vitamin D level around time of next office visit in a few months

## 2022-12-05 ENCOUNTER — Other Ambulatory Visit (HOSPITAL_BASED_OUTPATIENT_CLINIC_OR_DEPARTMENT_OTHER): Payer: Self-pay

## 2022-12-08 DIAGNOSIS — H40023 Open angle with borderline findings, high risk, bilateral: Secondary | ICD-10-CM | POA: Diagnosis not present

## 2022-12-15 ENCOUNTER — Encounter (HOSPITAL_BASED_OUTPATIENT_CLINIC_OR_DEPARTMENT_OTHER): Payer: Self-pay | Admitting: Family Medicine

## 2022-12-18 ENCOUNTER — Other Ambulatory Visit (HOSPITAL_BASED_OUTPATIENT_CLINIC_OR_DEPARTMENT_OTHER): Payer: Self-pay

## 2022-12-18 ENCOUNTER — Encounter (HOSPITAL_BASED_OUTPATIENT_CLINIC_OR_DEPARTMENT_OTHER): Payer: Self-pay | Admitting: Cardiology

## 2022-12-18 ENCOUNTER — Ambulatory Visit (INDEPENDENT_AMBULATORY_CARE_PROVIDER_SITE_OTHER): Payer: 59 | Admitting: Cardiology

## 2022-12-18 VITALS — BP 124/86 | HR 63 | Ht 66.5 in | Wt 193.9 lb

## 2022-12-18 DIAGNOSIS — R072 Precordial pain: Secondary | ICD-10-CM

## 2022-12-18 DIAGNOSIS — I10 Essential (primary) hypertension: Secondary | ICD-10-CM

## 2022-12-18 DIAGNOSIS — E782 Mixed hyperlipidemia: Secondary | ICD-10-CM

## 2022-12-18 DIAGNOSIS — M25531 Pain in right wrist: Secondary | ICD-10-CM | POA: Diagnosis not present

## 2022-12-18 DIAGNOSIS — Z7189 Other specified counseling: Secondary | ICD-10-CM | POA: Diagnosis not present

## 2022-12-18 DIAGNOSIS — S62354A Nondisplaced fracture of shaft of fourth metacarpal bone, right hand, initial encounter for closed fracture: Secondary | ICD-10-CM | POA: Diagnosis not present

## 2022-12-18 DIAGNOSIS — Z8249 Family history of ischemic heart disease and other diseases of the circulatory system: Secondary | ICD-10-CM | POA: Diagnosis not present

## 2022-12-18 MED ORDER — METOPROLOL TARTRATE 25 MG PO TABS
12.5000 mg | ORAL_TABLET | Freq: Once | ORAL | 0 refills | Status: DC
  Filled 2022-12-18 – 2022-12-29 (×2): qty 1, 2d supply, fill #0

## 2022-12-18 NOTE — Progress Notes (Signed)
Cardiology Office Note:  .    Date:  12/18/2022  ID:  Caitlyn Richardson, DOB Sep 08, 1980, MRN [id number] PCP: de Peru, Raymond J, MD  [facility] Providers Cardiologist:  Jodelle Red, MD     History of Present Illness: .    Caitlyn Richardson is a 42 y.o. female with a hx of hypertension, hypokalemia, vitamin D deficiency, depression, here for the evaluation of chest pain.  She presented to the ED 09/20/2022 complaining of left anterior chest pain with radiation to left shoulder. Serial EKGs were normal; her first troponin was drawn at about 4 hours post onset chest pain and was normal. Found to be hypokalemic 2.6, likely secondary to olmesartan/HCTZ. Prescribed potassium supplementation. She followed up with Dr. Marcy Salvo de Peru 10/2022. She was referred to cardiology for further evaluation given her symptoms, life stressors, and family history of heart disease.  Chest pain: -Initial onset: A couple months ago, since then she has intermittent episodes.  -Quality: A dull, aching pain, not sharp. Always localized to the same area of the left chest. -Frequency: Maybe once a week, every other week. Overall her episodes seem to be less frequent and less intense than they were at onset.  -Duration: Can last up to 10 minutes or longer. -Associated symptoms: Sometimes feels lightheaded, but not always. No syncopal episodes. -Aggravating/alleviating factors: No known alleviating factors. Usually thinking at the onset of her symptoms. However, does note experiencing up and down stress levels in the past 2 years. If she lies on her left side while sleeping, she may feel short of breath with some chest tightness.  -Prior cardiac history: None. -Prior ECG: Normal in ER 09/2022. -Prior workup: EKGs and troponin normal in ER 09/2022. -Prior treatment: None -Alcohol: Lately, no alcohol consumption. Otherwise, may have 2-3 glasses of wine in a week at most. -Tobacco: None. -Comorbidities:  Hypertension. Not diabetic. -Exercise level: Has felt fatigued constantly; more limited by this rather than her chest pain. -Cardiac ROS: No fevers or chills. No chills since hysterectomy. She denies any palpitations, peripheral edema, lightheadedness, headaches, syncope, or PND. -Family history: Her mother had CAD s/p MI between age 67-61. She does not know her biological father, had a stroke, occipital thrombus.   Was taking some OTC supplements a couple years ago, now discontinued    ROS:  Please see the history of present illness. ROS otherwise negative except as noted.  (+) Dull, aching chest pain (+) Occasional lightheadedness (+) Constant fatigue (+) Stress (+) Orthopnea while lying on left side  Studies Reviewed: Marland Kitchen    EKG Interpretation Date/Time:  Thursday December 18 2022 15:41:41 EDT Ventricular Rate:  63 PR Interval:  170 QRS Duration:  98 QT Interval:  420 QTC Calculation: 429 R Axis:   54  Text Interpretation: Normal sinus rhythm Normal ECG When compared with ECG of 28-Oct-2021 13:34, No significant change was found Confirmed by Jodelle Red [phone]) on 12/18/2022 3:55:28 PM    Physical Exam:    VS:  BP 124/86   Pulse 63   Ht 5' 6.5" (1.689 m)   Wt 193 lb 14.4 oz (88 kg)   LMP 09/22/2021 (Approximate)   SpO2 97%   BMI 30.83 kg/m    Wt Readings from Last 3 Encounters:  12/18/22 193 lb 14.4 oz (88 kg)  12/04/22 265 lb (120.2 kg)  11/25/22 193 lb 9.6 oz (87.8 kg)    GEN: Well nourished, well developed in no acute distress HEENT: Normal, moist mucous membranes NECK: No JVD  CARDIAC: regular rhythm, normal S1 and S2, no rubs or gallops. No murmur. VASCULAR: Radial and DP pulses 2+ bilaterally. No carotid bruits RESPIRATORY:  Clear to auscultation without rales, wheezing or rhonchi  ABDOMEN: Soft, non-tender, non-distended MUSCULOSKELETAL:  Ambulates independently SKIN: Warm and dry, no edema NEUROLOGIC:  Alert and oriented x 3. No focal neuro  deficits noted. PSYCHIATRIC:  Normal affect   ASSESSMENT AND PLAN: .    Chest pain, with risk factors of: Hypertension Mixed hyperlipidemia Family history of heart disease -discussed treadmill stress, nuclear stress/lexiscan, and CT coronary angiography. Discussed pros and cons of each, including but not limited to false positive/false negative risk, radiation risk, and risk of IV contrast dye. Based on shared decision making, decision was made to pursue CT coronary angiography. -will give one time dose of metoprolol 2 hours prior to scheduled test -counseled on need to get BMET prior to test -counseled on use of sublingual nitroglycerin and its importance to a good test -reviewed red flag warning signs that need immediate medical attention   CV risk counseling and prevention CV risk counseling and prevention -recommend heart healthy/Mediterranean diet, with whole grains, fruits, vegetable, fish, lean meats, nuts, and olive oil. Limit salt. -recommend moderate walking, 3-5 times/week for 30-50 minutes each session. Aim for at least 150 minutes.week. Goal should be pace of 3 miles/hours, or walking 1.5 miles in 30 minutes -recommend avoidance of tobacco products. Avoid excess alcohol. -ASCVD risk score: The 10-year ASCVD risk score (Arnett DK, et al., 2019) is: 5.3%   Values used to calculate the score:     Age: 63 years     Sex: Female     Is Non-Hispanic African American: Yes     Diabetic: No     Tobacco smoker: No     Systolic Blood Pressure: 124 mmHg     Is BP treated: Yes     HDL Cholesterol: 33 mg/dL     Total Cholesterol: 228 mg/dL    Dispo: Follow-up TBD based on results of coronary CT, or sooner as needed.  I,Mathew Stumpf,acting as a Neurosurgeon for [employer], MD.,have documented all relevant documentation on the behalf of Jodelle Red, MD,as directed by  Jodelle Red, MD while in the presence of Jodelle Red, MD.  I, Jodelle Red, MD, have reviewed all documentation for this visit. The documentation on 12/18/22 for the exam, diagnosis, procedures, and orders are all accurate and complete.   Signed, Jodelle Red, MD

## 2022-12-18 NOTE — Patient Instructions (Signed)
Medication Instructions:  TAKE- Metoprolol 25 mg by mouth 2 hour prior to your CTA HOLD- Olmesartan/hydrochlorothiazide the morning of your procedure  *If you need a refill on your cardiac medications before your next appointment, please call your pharmacy*   Lab Work: BMP 1 week prior to CTA  If you have labs (blood work) drawn today and your tests are completely normal, you will receive your results only by: MyChart Message (if you have MyChart) OR A paper copy in the mail If you have any lab test that is abnormal or we need to change your treatment, we will call you to review the results.   Testing/Procedures: Non-Cardiac CT Angiography (CTA), is a special type of CT scan that uses a computer to produce multi-dimensional views of major blood vessels throughout the body. In CT angiography, a contrast material is injected through an IV to help visualize the blood vessels   Follow-Up: At [facility], you and your health needs are our priority.  As part of our continuing mission to provide you with exceptional heart care, we have created designated Provider Care Teams.  These Care Teams include your primary Cardiologist (physician) and Advanced Practice Providers (APPs -  Physician Assistants and Nurse Practitioners) who all work together to provide you with the care you need, when you need it.  We recommend signing up for the patient portal called "MyChart".  Sign up information is provided on this After Visit Summary.  MyChart is used to connect with patients for Virtual Visits (Telemedicine).  Patients are able to view lab/test results, encounter notes, upcoming appointments, etc.  Non-urgent messages can be sent to your provider as well.   To learn more about what you can do with MyChart, go to [email or web address].    Your next appointment:   To Be determined  Other Instructions   Your cardiac CT will be scheduled at one of the below locations:   [facility] [address] [place], [place] [address] [phone]  OR  [facility] [address] [place], [place] [address] [phone]  OR   [facility] [address] [place], [place] [address] [phone]  If scheduled at [facility], please arrive at the [facility] and Children's Entrance (Entrance C2) of [facility] 30 minutes prior to test start time. You can use the FREE valet parking offered at entrance C (encouraged to control the heart rate for the test)  Proceed to the [facility] Radiology Department (first floor) to check-in and test prep.  All radiology patients and guests should use entrance C2 at [facility], accessed from [facility], even though the hospital's physical address listed is [address]    If scheduled at [facility] or [facility], please arrive 15 mins early for check-in and test prep.  There is spacious parking and easy access to the radiology department from the [facility] Heart and Vascular entrance. Please enter here and check-in with the desk attendant.   Please follow these instructions carefully (unless otherwise directed):   On the Night Before the Test: Be sure to Drink plenty of water. Do not consume any caffeinated/decaffeinated beverages or chocolate 12 hours prior to your test. Do not take any antihistamines 12 hours prior to your test.   On the Day of the Test: Drink plenty of water until 1 hour prior to the test. Do not eat any food  1 hour prior to test. You may take your regular medications prior to the test.  Take metoprolol (Lopressor) two hours prior to test. If you take Furosemide/Hydrochlorothiazide/Spironolactone, please HOLD on the morning of the test. FEMALES- please wear underwire-free bra if available, avoid dresses & tight clothing  After the  Test: Drink plenty of water. After receiving IV contrast, you may experience a mild flushed feeling. This is normal. On occasion, you may experience a mild rash up to 24 hours after the test. This is not dangerous. If this occurs, you can take Benadryl 25 mg and increase your fluid intake. If you experience trouble breathing, this can be serious. If it is severe call 911 IMMEDIATELY. If it is mild, please call our office. If you take any of these medications: Glipizide/Metformin, Avandament, Glucavance, please do not take 48 hours after completing test unless otherwise instructed.  We will call to schedule your test 2-4 weeks out understanding that some insurance companies will need an authorization prior to the service being performed.   For more information and frequently asked questions, please visit our website : [email or web address]  For non-scheduling related questions, please contact the cardiac imaging nurse navigator should you have any questions/concerns: Cardiac Imaging Nurse Navigators Direct Office Dial: [phone]   For scheduling needs, including cancellations and rescheduling, please call [place], [phone].

## 2022-12-24 ENCOUNTER — Ambulatory Visit: Payer: 59 | Admitting: Licensed Clinical Social Worker

## 2022-12-25 DIAGNOSIS — R072 Precordial pain: Secondary | ICD-10-CM | POA: Diagnosis not present

## 2022-12-26 ENCOUNTER — Ambulatory Visit (HOSPITAL_BASED_OUTPATIENT_CLINIC_OR_DEPARTMENT_OTHER): Payer: 59

## 2022-12-26 ENCOUNTER — Encounter (HOSPITAL_COMMUNITY): Payer: Self-pay

## 2022-12-26 ENCOUNTER — Encounter (HOSPITAL_BASED_OUTPATIENT_CLINIC_OR_DEPARTMENT_OTHER): Payer: Self-pay

## 2022-12-26 LAB — BASIC METABOLIC PANEL
BUN/Creatinine Ratio: 16 (ref 9–23)
BUN: 12 mg/dL (ref 6–24)
CO2: 27 mmol/L (ref 20–29)
Calcium: 9.9 mg/dL (ref 8.7–10.2)
Chloride: 102 mmol/L (ref 96–106)
Creatinine, Ser: 0.73 mg/dL (ref 0.57–1.00)
Glucose: 92 mg/dL (ref 70–99)
Potassium: 3.5 mmol/L (ref 3.5–5.2)
Sodium: 141 mmol/L (ref 134–144)
eGFR: 105 mL/min/{1.73_m2} (ref >59)

## 2022-12-26 NOTE — Telephone Encounter (Signed)
Will you advise that labs are ok for CT Tuesday?

## 2022-12-29 ENCOUNTER — Other Ambulatory Visit (HOSPITAL_BASED_OUTPATIENT_CLINIC_OR_DEPARTMENT_OTHER): Payer: Self-pay

## 2022-12-30 ENCOUNTER — Encounter (HOSPITAL_BASED_OUTPATIENT_CLINIC_OR_DEPARTMENT_OTHER): Payer: Self-pay | Admitting: Obstetrics & Gynecology

## 2022-12-30 ENCOUNTER — Ambulatory Visit (HOSPITAL_COMMUNITY)
Admission: RE | Admit: 2022-12-30 | Discharge: 2022-12-30 | Disposition: A | Discharge status: Home / Self Care | Payer: 59 | Source: Ambulatory Visit | Attending: Cardiology | Admitting: Cardiology

## 2022-12-30 ENCOUNTER — Other Ambulatory Visit: Payer: Self-pay

## 2022-12-30 ENCOUNTER — Ambulatory Visit (INDEPENDENT_AMBULATORY_CARE_PROVIDER_SITE_OTHER): Payer: 59 | Admitting: Licensed Clinical Social Worker

## 2022-12-30 ENCOUNTER — Other Ambulatory Visit (HOSPITAL_BASED_OUTPATIENT_CLINIC_OR_DEPARTMENT_OTHER): Payer: Self-pay

## 2022-12-30 ENCOUNTER — Ambulatory Visit (INDEPENDENT_AMBULATORY_CARE_PROVIDER_SITE_OTHER): Payer: 59 | Admitting: Obstetrics & Gynecology

## 2022-12-30 ENCOUNTER — Other Ambulatory Visit (HOSPITAL_COMMUNITY)
Admission: RE | Admit: 2022-12-30 | Discharge: 2022-12-30 | Disposition: A | Discharge status: Home / Self Care | Payer: 59 | Source: Ambulatory Visit | Attending: Obstetrics & Gynecology | Admitting: Obstetrics & Gynecology

## 2022-12-30 VITALS — BP 135/91 | HR 70 | Ht 66.5 in | Wt 194.6 lb

## 2022-12-30 DIAGNOSIS — K649 Unspecified hemorrhoids: Secondary | ICD-10-CM | POA: Diagnosis not present

## 2022-12-30 DIAGNOSIS — F411 Generalized anxiety disorder: Secondary | ICD-10-CM

## 2022-12-30 DIAGNOSIS — F339 Major depressive disorder, recurrent, unspecified: Secondary | ICD-10-CM | POA: Diagnosis not present

## 2022-12-30 DIAGNOSIS — Z90710 Acquired absence of both cervix and uterus: Secondary | ICD-10-CM

## 2022-12-30 DIAGNOSIS — Z01419 Encounter for gynecological examination (general) (routine) without abnormal findings: Secondary | ICD-10-CM

## 2022-12-30 DIAGNOSIS — Z8742 Personal history of other diseases of the female genital tract: Secondary | ICD-10-CM | POA: Insufficient documentation

## 2022-12-30 DIAGNOSIS — R072 Precordial pain: Secondary | ICD-10-CM

## 2022-12-30 DIAGNOSIS — Z202 Contact with and (suspected) exposure to infections with a predominantly sexual mode of transmission: Secondary | ICD-10-CM | POA: Insufficient documentation

## 2022-12-30 DIAGNOSIS — M5127 Other intervertebral disc displacement, lumbosacral region: Secondary | ICD-10-CM | POA: Diagnosis not present

## 2022-12-30 DIAGNOSIS — Z6831 Body mass index (BMI) 31.0-31.9, adult: Secondary | ICD-10-CM | POA: Diagnosis not present

## 2022-12-30 MED ORDER — IOHEXOL 350 MG/ML SOLN
95.0000 mL | Freq: Once | INTRAVENOUS | Status: AC | PRN
  Administered 2022-12-30: 95 mL via INTRAVENOUS

## 2022-12-30 MED ORDER — LIDOCAINE 5 % EX OINT
1.0000 | TOPICAL_OINTMENT | Freq: Three times a day (TID) | CUTANEOUS | 0 refills | Status: DC | PRN
Start: 2022-12-30 — End: 2023-12-09
  Filled 2022-12-30: qty 35.44, 12d supply, fill #0

## 2022-12-30 MED ORDER — HYDROCORTISONE ACE-PRAMOXINE 1-1 % EX CREA
1.0000 | TOPICAL_CREAM | Freq: Two times a day (BID) | CUTANEOUS | 0 refills | Status: DC
  Filled 2022-12-30: qty 30, 15d supply, fill #0

## 2022-12-30 MED ORDER — NITROGLYCERIN 0.4 MG SL SUBL
0.8000 mg | SUBLINGUAL_TABLET | Freq: Once | SUBLINGUAL | Status: AC
  Administered 2022-12-30: 0.8 mg via SUBLINGUAL

## 2022-12-30 MED ORDER — NITROGLYCERIN 0.4 MG SL SUBL
SUBLINGUAL_TABLET | SUBLINGUAL | Status: AC
  Filled 2022-12-30: qty 2
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
## 2022-12-31 ENCOUNTER — Other Ambulatory Visit (HOSPITAL_COMMUNITY): Payer: Self-pay | Admitting: Neurosurgery

## 2022-12-31 DIAGNOSIS — M5127 Other intervertebral disc displacement, lumbosacral region: Secondary | ICD-10-CM

## 2022-12-31 LAB — CERVICOVAGINAL ANCILLARY ONLY
Bacterial Vaginitis (gardnerella): NEGATIVE
Candida Glabrata: NEGATIVE
Candida Vaginitis: NEGATIVE
Chlamydia: NEGATIVE
Comment: NEGATIVE
Comment: NEGATIVE
Comment: NEGATIVE
Comment: NEGATIVE
Comment: NEGATIVE
Comment: NORMAL
Neisseria Gonorrhea: NEGATIVE
Trichomonas: NEGATIVE

## 2023-01-02 DIAGNOSIS — M25531 Pain in right wrist: Secondary | ICD-10-CM | POA: Diagnosis not present

## 2023-01-06 ENCOUNTER — Other Ambulatory Visit (HOSPITAL_BASED_OUTPATIENT_CLINIC_OR_DEPARTMENT_OTHER): Payer: Self-pay

## 2023-01-09 ENCOUNTER — Encounter (HOSPITAL_BASED_OUTPATIENT_CLINIC_OR_DEPARTMENT_OTHER): Payer: Self-pay

## 2023-01-09 ENCOUNTER — Ambulatory Visit (HOSPITAL_BASED_OUTPATIENT_CLINIC_OR_DEPARTMENT_OTHER): Payer: 59

## 2023-01-20 ENCOUNTER — Ambulatory Visit (HOSPITAL_COMMUNITY)
Admission: RE | Admit: 2023-01-20 | Discharge: 2023-01-20 | Disposition: A | Discharge status: Home / Self Care | Payer: 59 | Source: Ambulatory Visit | Attending: Neurosurgery | Admitting: Neurosurgery

## 2023-01-20 ENCOUNTER — Ambulatory Visit (INDEPENDENT_AMBULATORY_CARE_PROVIDER_SITE_OTHER): Payer: 59 | Admitting: Licensed Clinical Social Worker

## 2023-01-20 ENCOUNTER — Ambulatory Visit
Admission: RE | Admit: 2023-01-20 | Discharge: 2023-01-20 | Disposition: A | Discharge status: Home / Self Care | Payer: 59 | Source: Ambulatory Visit | Attending: Obstetrics & Gynecology | Admitting: Obstetrics & Gynecology

## 2023-01-20 DIAGNOSIS — M48061 Spinal stenosis, lumbar region without neurogenic claudication: Secondary | ICD-10-CM | POA: Diagnosis not present

## 2023-01-20 DIAGNOSIS — F339 Major depressive disorder, recurrent, unspecified: Secondary | ICD-10-CM | POA: Diagnosis not present

## 2023-01-20 DIAGNOSIS — M5127 Other intervertebral disc displacement, lumbosacral region: Secondary | ICD-10-CM | POA: Diagnosis not present

## 2023-01-20 DIAGNOSIS — M5126 Other intervertebral disc displacement, lumbar region: Secondary | ICD-10-CM | POA: Diagnosis not present

## 2023-01-20 DIAGNOSIS — R921 Mammographic calcification found on diagnostic imaging of breast: Secondary | ICD-10-CM

## 2023-01-20 DIAGNOSIS — M5136 Other intervertebral disc degeneration, lumbar region: Secondary | ICD-10-CM | POA: Diagnosis not present

## 2023-01-20 DIAGNOSIS — M47816 Spondylosis without myelopathy or radiculopathy, lumbar region: Secondary | ICD-10-CM | POA: Diagnosis not present

## 2023-01-20 MED ORDER — GADOBUTROL 1 MMOL/ML IV SOLN
8.0000 mL | Freq: Once | INTRAVENOUS | Status: AC | PRN
  Administered 2023-01-20: 8 mL via INTRAVENOUS
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
## 2023-01-27 ENCOUNTER — Ambulatory Visit (INDEPENDENT_AMBULATORY_CARE_PROVIDER_SITE_OTHER): Payer: 59 | Admitting: Licensed Clinical Social Worker

## 2023-01-27 DIAGNOSIS — F339 Major depressive disorder, recurrent, unspecified: Secondary | ICD-10-CM
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
## 2023-01-28 ENCOUNTER — Ambulatory Visit: Payer: 59 | Admitting: Dermatology

## 2023-01-28 ENCOUNTER — Other Ambulatory Visit (HOSPITAL_BASED_OUTPATIENT_CLINIC_OR_DEPARTMENT_OTHER): Payer: Self-pay

## 2023-01-28 ENCOUNTER — Encounter: Payer: Self-pay | Admitting: Dermatology

## 2023-01-28 DIAGNOSIS — D23112 Other benign neoplasm of skin of right lower eyelid, including canthus: Secondary | ICD-10-CM | POA: Diagnosis not present

## 2023-01-28 DIAGNOSIS — L280 Lichen simplex chronicus: Secondary | ICD-10-CM | POA: Diagnosis not present

## 2023-01-28 DIAGNOSIS — L810 Postinflammatory hyperpigmentation: Secondary | ICD-10-CM

## 2023-01-28 DIAGNOSIS — D2310 Other benign neoplasm of skin of unspecified eyelid, including canthus: Secondary | ICD-10-CM

## 2023-01-28 MED ORDER — TACROLIMUS 0.1 % EX OINT
TOPICAL_OINTMENT | Freq: Two times a day (BID) | CUTANEOUS | 1 refills | Status: AC
  Filled 2023-01-28: qty 30, 30d supply, fill #0
  Filled 2023-08-19: qty 30, 30d supply, fill #1

## 2023-01-28 NOTE — Progress Notes (Signed)
   New Patient Visit   Subjective  Caitlyn Richardson is a 42 y.o. female who presents for the following: Area under her right eye gets itchy and dry. It started as just a dark patch. She has used OTC products in the past but they did not help. Recently started a cream from [employer] (2 1/2 weeks). She does have seasonal allergies.    The following portions of the chart were reviewed this encounter and updated as appropriate: medications, allergies, medical history  Review of Systems:  No other skin or systemic complaints except as noted in HPI or Assessment and Plan.  Objective  Well appearing patient in no apparent distress; mood and affect are within normal limits.   A focused examination was performed of the following areas: Face  Relevant exam findings are noted in the Assessment and Plan.    Assessment & Plan   LICHEN SIMPLEX CHRONICUS Exam: Excoriated lichenified papules and/or plaques at right infraorbital       Lichen simplex chronicus (LSC) is a persistent itchy area of thickened skin that is induced by chronic rubbing and/or scratching (chronic dermatitis).  These areas may be pink, hyperpigmented and may have excoriations and bumps (prurigo nodules- PN).  LSC/PN is commonly observed in uncontrolled atopic dermatitis and other forms of eczema, and in other itchy skin conditions (eg, insect bites, scabies).  Sometimes it is not possible to know initial cause of LSC/PN if it has been present for a long time.  It generally responds well to treatment with high potency topical steroids.  It is important to stop rubbing/scratching the area in order to break the itch-scratch-rash-itch cycle, in order for the rash to resolve.   Treatment Plan: Start Tacrolimus 0.1% ointment twice daily to affected area under right eye.  May continue D[employer]ica moisturizer.  May consider a topical retinoid in the future. Avoid picking/rubbing/scratching   Syringomas Exam: Flesh colored papules  of right infraorbital  Treatment Plan: May consider treating with ED and/or topical retinoid in the future.   Return in about 6 weeks (around 03/11/2023).  I, Joanie Coddington, CMA, am acting as scribe for [employer], DO .   Documentation: I have reviewed the above documentation for accuracy and completeness, and I agree with the above.  Langston Reusing, DO

## 2023-01-28 NOTE — Patient Instructions (Addendum)
Hello Caitlyn Richardson,  Thank you for visiting [place] today. We appreciate your commitment to improving your health and addressing your dermatological concerns. Here is a summary of the key instructions from today's consultation:  - Medication: Begin applying Tacrolimus ointment twice daily to affected area on eye, in the morning and at night, for six weeks. This is to help reduce inflammation and lighten the affected area.   - Duration: Continue this treatment for the full six-week period.  - Skin Care: Maintain your current regimen using the dermatologica face wash and face cream with ceramides. We may explore the addition of a mild retinoid to your routine in the future.  - Daily Routine: Stick to using gentle wipes at night, followed by a rinse with water, as part of a double cleanse method.  - Insurance and Pharmacy: Your Tacrolimus prescription will be sent to your pharmacy. Should there be any issues with insurance coverage, we may consider switching to Pimecrolimus or utilizing a specialty pharmacy to ensure the treatment remains affordable.  - Follow-Up: We have scheduled a return visit in six weeks to assess your progress and discuss further treatment options. Initial photographs have been taken to monitor changes over time.  We look forward to seeing the positive changes at your next visit. If you have any questions or concerns before then, please do not hesitate to contact our office.  Warm regards,  Dr. Langston Reusing,  Dermatology Important Information  Due to recent changes in healthcare laws, you may see results of your pathology and/or laboratory studies on MyChart before the doctors have had a chance to review them. We understand that in some cases there may be results that are confusing or concerning to you. Please understand that not all results are received at the same time and often the doctors may need to interpret multiple results in order to provide you with the best plan of care or  course of treatment. Therefore, we ask that you please give [place] 2 business days to thoroughly review all your results before contacting the office for clarification. Should we see a critical lab result, you will be contacted sooner.   If You Need Anything After Your Visit  If you have any questions or concerns for your doctor, please call our main line at [phone] If no one answers, please leave a voicemail as directed and we will return your call as soon as possible. Messages left after 4 pm will be answered the following business day.   You may also send [place] a message via MyChart. We typically respond to MyChart messages within 1-2 business days.  For prescription refills, please ask your pharmacy to contact our office. Our fax number is [phone].  If you have an urgent issue when the clinic is closed that cannot wait until the next business day, you can page your doctor at the number below.    Please note that while we do our best to be available for urgent issues outside of office hours, we are not available 24/7.   If you have an urgent issue and are unable to reach [place], you may choose to seek medical care at your doctor's office, retail clinic, urgent care center, or emergency room.  If you have a medical emergency, please immediately call 911 or go to the emergency department. In the event of inclement weather, please call our main line at [phone] for an update on the status of any delays or closures.  Dermatology Medication Tips: Please keep the  boxes that topical medications come in in order to help keep track of the instructions about where and how to use these. Pharmacies typically print the medication instructions only on the boxes and not directly on the medication tubes.   If your medication is too expensive, please contact our office at [phone] or send [place] a message through MyChart.   We are unable to tell what your co-pay for medications will be in advance as  this is different depending on your insurance coverage. However, we may be able to find a substitute medication at lower cost or fill out paperwork to get insurance to cover a needed medication.   If a prior authorization is required to get your medication covered by your insurance company, please allow [place] 1-2 business days to complete this process.  Drug prices often vary depending on where the prescription is filled and some pharmacies may offer cheaper prices.  The website www.goodrx.com contains coupons for medications through different pharmacies. The prices here do not account for what the cost may be with help from insurance (it may be cheaper with your insurance), but the website can give you the price if you did not use any insurance.  - You can print the associated coupon and take it with your prescription to the pharmacy.  - You may also stop by our office during regular business hours and pick up a GoodRx coupon card.  - If you need your prescription sent electronically to a different pharmacy, notify our office through [facility] or by phone at [phone]

## 2023-02-02 DIAGNOSIS — G5621 Lesion of ulnar nerve, right upper limb: Secondary | ICD-10-CM | POA: Diagnosis not present

## 2023-02-02 DIAGNOSIS — G5601 Carpal tunnel syndrome, right upper limb: Secondary | ICD-10-CM | POA: Diagnosis not present

## 2023-02-06 DIAGNOSIS — M25531 Pain in right wrist: Secondary | ICD-10-CM | POA: Diagnosis not present

## 2023-02-11 ENCOUNTER — Encounter: Payer: Self-pay | Admitting: Primary Care

## 2023-02-11 ENCOUNTER — Ambulatory Visit: Payer: 59 | Admitting: Primary Care

## 2023-02-11 ENCOUNTER — Ambulatory Visit: Payer: 59 | Admitting: Licensed Clinical Social Worker

## 2023-02-11 VITALS — BP 140/80 | HR 75 | Ht 66.5 in | Wt 198.6 lb

## 2023-02-11 DIAGNOSIS — R0683 Snoring: Secondary | ICD-10-CM

## 2023-02-11 NOTE — Patient Instructions (Addendum)
Sleep apnea is defined as period of 10 seconds or longer when you stop breathing at night. This can happen multiple times a night. Dx sleep apnea is when this occurs more than 5 times an hour.    Mild OSA 5-15 apneic events an hour Moderate OSA 15-30 apneic events an hour Severe OSA > 30 apneic events an hour   Untreated sleep apnea puts you at higher risk for cardiac arrhythmias, pulmonary HTN, stroke and diabetes   Treatment options include weight loss, side sleeping position, oral appliance, CPAP therapy or referral to ENT for possible surgical options    Recommendations: Focus on side sleeping position or elevate head with wedge pillow 30 degrees Work on weight loss efforts if able  Do not drive if experiencing excessive daytime sleepiness of fatigue    Orders: Home sleep study re: loud snoring (ordered)    Follow-up: Please call to schedule follow-up 1-2 weeks after completing home sleep study to review results and treatment if needed (can be virtual)  Sleep Apnea Sleep apnea affects breathing during sleep. It causes breathing to stop for 10 seconds or more, or to become shallow. People with sleep apnea usually snore loudly. It can also increase the risk of: Heart attack. Stroke. Being very overweight (obese). Diabetes. Heart failure. Irregular heartbeat. High blood pressure. The goal of treatment is to help you breathe normally again. What are the causes?  The most common cause of this condition is a collapsed or blocked airway. There are three kinds of sleep apnea: Obstructive sleep apnea. This is caused by a blocked or collapsed airway. Central sleep apnea. This happens when the brain does not send the right signals to the muscles that control breathing. Mixed sleep apnea. This is a combination of obstructive and central sleep apnea. What increases the risk? Being overweight. Smoking. Having a small airway. Being older. Being female. Drinking alcohol. Taking  medicines to calm yourself (sedatives or tranquilizers). Having family members with the condition. Having a tongue or tonsils that are larger than normal. What are the signs or symptoms? Trouble staying asleep. Loud snoring. Headaches in the morning. Waking up gasping. Dry mouth or sore throat in the morning. Being sleepy or tired during the day. If you are sleepy or tired during the day, you may also: Not be able to focus your mind (concentrate). Forget things. Get angry a lot and have mood swings. Feel sad (depressed). Have changes in your personality. Have less interest in sex, if you are female. Be unable to have an erection, if you are female. How is this treated?  Sleeping on your side. Using a medicine to get rid of mucus in your nose (decongestant). Avoiding the use of alcohol, medicines to help you relax, or certain pain medicines (narcotics). Losing weight, if needed. Changing your diet. Quitting smoking. Using a machine to open your airway while you sleep, such as: An oral appliance. This is a mouthpiece that shifts your lower jaw forward. A CPAP device. This device blows air through a mask when you breathe out (exhale). An EPAP device. This has valves that you put in each nostril. A BIPAP device. This device blows air through a mask when you breathe in (inhale) and breathe out. Having surgery if other treatments do not work. Follow these instructions at home: Lifestyle Make changes that your doctor recommends. Eat a healthy diet. Lose weight if needed. Avoid alcohol, medicines to help you relax, and some pain medicines. Do not smoke or use any products   that contain nicotine or tobacco. If you need help quitting, ask your doctor. General instructions Take over-the-counter and prescription medicines only as told by your doctor. If you were given a machine to use while you sleep, use it only as told by your doctor. If you are having surgery, make sure to tell your  doctor you have sleep apnea. You may need to bring your device with you. Keep all follow-up visits. Contact a doctor if: The machine that you were given to use during sleep bothers you or does not seem to be working. You do not get better. You get worse. Get help right away if: Your chest hurts. You have trouble breathing in enough air. You have an uncomfortable feeling in your back, arms, or stomach. You have trouble talking. One side of your body feels weak. A part of your face is hanging down. These symptoms may be an emergency. Get help right away. Call your local emergency services (911 in the [place] Do not wait to see if the symptoms will go away. Do not drive yourself to the hospital. Summary This condition affects breathing during sleep. The most common cause is a collapsed or blocked airway. The goal of treatment is to help you breathe normally while you sleep. This information is not intended to replace advice given to you by your health care provider. Make sure you discuss any questions you have with your health care provider. Document Revised: 11/28/2020 Document Reviewed: 03/30/2020 Elsevier Patient Education  2024 [employer] Inc[employer]   

## 2023-02-11 NOTE — Assessment & Plan Note (Addendum)
-  Patient has symptoms of loud snoring, restless/disrupted sleep and daytime sleepiness. She has difficulty falling and staying asleep. Not currently taking any sleep aids. Epworth score 8.  BMI 31.  Patient is at risk for obstructive sleep apnea due to obesity, needs home sleep study to evaluate.  We reviewed risks of untreated sleep apnea including cardiac arrhythmias, pulmonary hypertension, diabetes and stroke.  We also discussed treatment options including weight loss, oral appliance, CPAP therapy or referral to ENT for possible surgical options.  Encourage side sleeping position.  Caution against driving if experiencing excessive daytime sleepiness or fatigue.  Discouraged alcohol use or sedatives prior to bedtime as these can worsen underlying sleep apnea.  Follow-up 1 to 2 weeks after sleep study review results and treatment options if needed.
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
## 2023-02-12 ENCOUNTER — Ambulatory Visit: Payer: 59 | Admitting: Licensed Clinical Social Worker

## 2023-02-20 DIAGNOSIS — R0683 Snoring: Secondary | ICD-10-CM

## 2023-02-23 DIAGNOSIS — Z6831 Body mass index (BMI) 31.0-31.9, adult: Secondary | ICD-10-CM | POA: Diagnosis not present

## 2023-02-23 DIAGNOSIS — M5416 Radiculopathy, lumbar region: Secondary | ICD-10-CM | POA: Diagnosis not present

## 2023-02-23 DIAGNOSIS — M5127 Other intervertebral disc displacement, lumbosacral region: Secondary | ICD-10-CM | POA: Diagnosis not present

## 2023-02-25 ENCOUNTER — Ambulatory Visit (INDEPENDENT_AMBULATORY_CARE_PROVIDER_SITE_OTHER): Payer: 59 | Admitting: Licensed Clinical Social Worker

## 2023-02-25 DIAGNOSIS — F339 Major depressive disorder, recurrent, unspecified: Secondary | ICD-10-CM | POA: Diagnosis not present

## 2023-02-25 DIAGNOSIS — F411 Generalized anxiety disorder: Secondary | ICD-10-CM

## 2023-02-25 NOTE — Progress Notes (Signed)
[facility] Counselor/Therapist Progress Note  Patient ID: Caitlyn Richardson, MRN: [id number]    Date: 02/25/23  Time Spent: 0403  pm - 0500 pm : 57 Minutes  Treatment Type: Individual Therapy.  Reported Symptoms: Symptoms of depression and loneliness.  Mental Status Exam: Appearance:  Casual     Behavior: Appropriate  Motor: Normal  Speech/Language:  Clear and Coherent  Affect: Appropriate  Mood: normal  Thought process: normal  Thought content:   WNL  Sensory/Perceptual disturbances:   WNL  Orientation: oriented to person, place, time/date, situation, day of week, month of year, and year  Attention: Good  Concentration: Good  Memory: WNL  Fund of knowledge:  Good  Insight:   Good  Judgment:  Good  Impulse Control: Good   Risk Assessment: Danger to Self:  No Self-injurious Behavior: No Danger to Others: No Duty to Warn:no Physical Aggression / Violence:No  Access to Firearms a concern: No  Gang Involvement:No   Subjective:   Caitlyn Richardson participated from office located at [facility] with Clinician present.  Caitlyn Richardson consented to treatment.    Patient presented reporting that she had a rough weekend. Patient identified that her situation hasn't changed and that having her mother living with her is at times too much. Patient reports that she desires to have a companion but her Mother has always interfered in that. Patient shared that she needs time for herself and she has never had that in her life. Clinician and patient processed the importance of communication and how this is needed in the situation with her mother. Clinician assisted patient in identifying that open communication could lead to a solution that everyone can be happy with. Clinician identified that patients depressive symptoms could begin to improve once the situation is addressed and changes are made.  Interventions: Cognitive Behavioral Therapy, Assertiveness/Communication, and  Solution-Oriented/Positive Psychology  Diagnosis: Depression, recurrent   Plan: Caitlyn Richardson is to use CBT, mindfulness and coping skills to help manage decrease symptoms associated with their diagnosis.   Long-term goal:   Caitlyn Richardson will reduce overall level, frequency, and intensity of the feelings of depression, anxiety evidenced by       decreased irritability, negative self talk, and helpless feelings from 6 to 7 days/week to 0 to 2 days/week per client report for at least 3 consecutive months. Treatment plan to be reviewed by 12/30/2023.  Short-term goal:  Caitlyn Richardson will verbally express understanding of the relationship between feelings of depression, anxiety and their impact on thinking patterns and behaviors. Verbalize an understanding of the role that distorted thinking plays in creating fears, excessive worry, and ruminations.  [name] MSW, LCSW DATE: 02/25/2023

## 2023-02-26 ENCOUNTER — Ambulatory Visit: Payer: 59 | Admitting: Licensed Clinical Social Worker

## 2023-03-02 ENCOUNTER — Other Ambulatory Visit (HOSPITAL_BASED_OUTPATIENT_CLINIC_OR_DEPARTMENT_OTHER): Payer: Self-pay | Admitting: Nurse Practitioner

## 2023-03-02 ENCOUNTER — Other Ambulatory Visit (HOSPITAL_BASED_OUTPATIENT_CLINIC_OR_DEPARTMENT_OTHER): Payer: Self-pay

## 2023-03-02 DIAGNOSIS — I10 Essential (primary) hypertension: Secondary | ICD-10-CM

## 2023-03-03 ENCOUNTER — Other Ambulatory Visit: Payer: Self-pay

## 2023-03-03 ENCOUNTER — Other Ambulatory Visit (HOSPITAL_BASED_OUTPATIENT_CLINIC_OR_DEPARTMENT_OTHER): Payer: Self-pay

## 2023-03-03 MED ORDER — AMLODIPINE BESYLATE 5 MG PO TABS
5.0000 mg | ORAL_TABLET | Freq: Every day | ORAL | 3 refills | Status: DC
Start: 2023-03-03 — End: 2023-03-27
  Filled 2023-03-03: qty 90, 90d supply, fill #0

## 2023-03-03 MED ORDER — OLMESARTAN MEDOXOMIL-HCTZ 40-25 MG PO TABS
1.0000 | ORAL_TABLET | Freq: Every day | ORAL | 3 refills | Status: DC
  Filled 2023-03-03: qty 90, 90d supply, fill #0

## 2023-03-05 ENCOUNTER — Telehealth (HOSPITAL_BASED_OUTPATIENT_CLINIC_OR_DEPARTMENT_OTHER): Payer: Self-pay | Admitting: *Deleted

## 2023-03-05 NOTE — Telephone Encounter (Signed)
Pt was called back in regards to wanting to speak with a nurse. She stated she had some concerns in regards to her right nipple leaking blood. Pt was offered an appt to come in tomorrow for evaluation at 9:15am.

## 2023-03-05 NOTE — Telephone Encounter (Signed)
Patient called and would like for the nurse to please call her. 

## 2023-03-06 ENCOUNTER — Encounter (HOSPITAL_BASED_OUTPATIENT_CLINIC_OR_DEPARTMENT_OTHER): Payer: Self-pay | Admitting: Certified Nurse Midwife

## 2023-03-06 ENCOUNTER — Telehealth (HOSPITAL_BASED_OUTPATIENT_CLINIC_OR_DEPARTMENT_OTHER): Payer: Self-pay | Admitting: Family Medicine

## 2023-03-06 ENCOUNTER — Ambulatory Visit (INDEPENDENT_AMBULATORY_CARE_PROVIDER_SITE_OTHER): Payer: 59 | Admitting: Certified Nurse Midwife

## 2023-03-06 VITALS — BP 146/91 | HR 55 | Ht 66.5 in | Wt 196.2 lb

## 2023-03-06 DIAGNOSIS — N6452 Nipple discharge: Secondary | ICD-10-CM

## 2023-03-06 NOTE — Telephone Encounter (Signed)
Flu Shot was given on Oct 25 th at her work

## 2023-03-06 NOTE — Telephone Encounter (Signed)
Flu shot documented

## 2023-03-06 NOTE — Progress Notes (Signed)
Subjective:     Caitlyn Richardson is an 42 y.o. female who presents for evaluation of right nipple discharge. Onset was a few days ago. Reports she is leaking from a few different ducts right nipple. Discharge is sometimes bloody, sometimes serous with blood.   Her most recent mammogram was 01/20/23 and indicated stable probably benign right breast calcifications: Grossly unchanged calcifications upper-outer right breast. No new masses, calcifications or nonsurgical distortion identified within either breast.  The following portions of the patient's history were reviewed and updated as appropriate: allergies, current medications, past family history, past medical history, past social history, past surgical history, and problem list.  Review of Systems Pertinent items are noted in HPI.     Objective:    Breasts: No nipple retraction or dimpling, Normal to palpation without dominant masses, positive findings: nipple discharge on the right  (bloody)   Assessment:    Right Nipple Discharge-bloody    Plan:    Arranged for mammogram. Arranged for ultrasound. Prolactin level pending Plan of care reviewed by Dr. Hyacinth Meeker Will follow-up with patient once results of right breast diagnostic mammogram and right breast US finalized. Pt aware nipple discharge may represent right breast  intraductal papilloma. We will continue to evaluate.  Letta Kocher

## 2023-03-07 LAB — PROLACTIN: Prolactin: 17.1 ng/mL (ref 4.8–33.4)

## 2023-03-11 ENCOUNTER — Ambulatory Visit: Payer: 59 | Admitting: Dermatology

## 2023-03-11 ENCOUNTER — Ambulatory Visit: Payer: 59 | Admitting: Licensed Clinical Social Worker

## 2023-03-11 DIAGNOSIS — F339 Major depressive disorder, recurrent, unspecified: Secondary | ICD-10-CM

## 2023-03-11 DIAGNOSIS — L700 Acne vulgaris: Secondary | ICD-10-CM

## 2023-03-11 DIAGNOSIS — F411 Generalized anxiety disorder: Secondary | ICD-10-CM | POA: Diagnosis not present

## 2023-03-11 DIAGNOSIS — L810 Postinflammatory hyperpigmentation: Secondary | ICD-10-CM | POA: Diagnosis not present

## 2023-03-11 DIAGNOSIS — L280 Lichen simplex chronicus: Secondary | ICD-10-CM

## 2023-03-11 MED ORDER — SAFETY SEAL MISCELLANEOUS MISC
2 refills | Status: DC
Start: 2023-03-11 — End: 2024-01-01

## 2023-03-11 NOTE — Progress Notes (Signed)
[facility] Counselor/Therapist Progress Note  Patient ID: Caitlyn Richardson, MRN: [id number]    Date: 03/11/23  Time Spent: 0400  pm - 0455 pm : 55 Minutes  Treatment Type: Individual Therapy.  Reported Symptoms: Depression and anxiety related to setting boundaries with her mother.  Mental Status Exam: Appearance:  Casual     Behavior: Appropriate  Motor: Normal  Speech/Language:  Clear and Coherent  Affect: Appropriate  Mood: normal  Thought process: normal  Thought content:   WNL  Sensory/Perceptual disturbances:   WNL  Orientation: oriented to person, place, time/date, situation, day of week, month of year, and year  Attention: Good  Concentration: Good  Memory: WNL  Fund of knowledge:  Good  Insight:   Good  Judgment:  Good  Impulse Control: Good   Risk Assessment: Danger to Self:  No Self-injurious Behavior: No Danger to Others: No Duty to Warn:no Physical Aggression / Violence:No  Access to Firearms a concern: No  Gang Involvement:No   Subjective:   Caitlyn Richardson participated from office, located at [facility] with Clinician present. Caitlyn Richardson consented to treatment.   Caitlyn Richardson presented for her session. Patient shared that she is still not making progress with having her Mother make changes. Patient identified that she feels there is a negative environment at home since her Mother is there. Clinician and patient processed the importance of setting boundaries with both her mother and her relationships. Clinician provided ideas related to setting boundaries and modeled conversations that can lead to healthy communication with difficult topics. We discussed that a negative environment at home has many negative effects on ones physical and mental well being.  Interventions: Cognitive Behavioral Therapy, Assertiveness/Communication, and Solution-Oriented/Positive Psychology  Diagnosis: Depression, recurrent (HCC)  Generalized anxiety  disorder   Plan: Caitlyn Richardson  is to use CBT, mindfulness and coping skills to help manage decrease symptoms associated with their diagnosis.   Long-term goal:   Caitlyn Richardson will reduce overall level, frequency, and intensity of the feelings of depression, anxiety  evidenced by       decreased irritability, negative self talk, and helpless feelings from 6 to 7 days/week to 0 to 2 days/week per client report for at least 3 consecutive months. Treatment plan to be reviewed by 12/30/2023.  Short-term goal:  Caitlyn Richardson will verbally express understanding of the relationship between feelings of depression, anxiety and their impact on thinking patterns and behaviors. Verbalize an understanding of the role that distorted thinking plays in creating fears, excessive worry, and ruminations.  Caitlyn Richardson MSW, LCSW DATE: 03/11/2023

## 2023-03-11 NOTE — Patient Instructions (Addendum)
Hello Ms. Caitlyn Richardson,  Thank you for visiting [place] today. We appreciate your commitment to improving your skin health. Here is a summary of the key instructions from today's consultation:  - Tacrolimus Ointment: Continue using in the mornings if it's not too greasy.  - Lightening Cream: Start using at night. This cream contains tranexamic acid, kojic acid, vitamin C, and tretinoin.   - Application: Apply a pea-sized amount, dabbing and rubbing it in gently. - Initial: Start every other night for 2 weeks.   - Progression: Then nightly if tolerated. Reduce frequency if dryness occurs. - Pre-Treatment: Use Hyaluronic Acid (recommendation: [place]) to prevent dryness and irritation from tretinoin, especially near the eyes.   - Moisturizers:   - Continue using Dermatologic  moisturizer.   - Try the sample of La Roche-Posay Triple Moisture Repair for additional hydration.  - Improvement Timeline: Expect slow improvement in pigmentation, approximately over 3 months.  - Prescription Pickup: [facility] will contact you for verification and payment (approximately $40) before mailing your prescription.  - Follow-Up: We will reassess your treatment in three months to determine if adjustments are necessary.  Please feel free to reach out if you have any questions or concerns about your regimen. We look forward to seeing your progress at your next appointment.  Best regards,  Dr. Langston Reusing Dermatology     Important Information  Due to recent changes in healthcare laws, you may see results of your pathology and/or laboratory studies on MyChart before the doctors have had a chance to review them. We understand that in some cases there may be results that are confusing or concerning to you. Please understand that not all results are received at the same time and often the doctors may need to interpret multiple results in order to provide you with the best plan of care or course of treatment.  Therefore, we ask that you please give [place] 2 business days to thoroughly review all your results before contacting the office for clarification. Should we see a critical lab result, you will be contacted sooner.   If You Need Anything After Your Visit  If you have any questions or concerns for your doctor, please call our main line at [phone] If no one answers, please leave a voicemail as directed and we will return your call as soon as possible. Messages left after 4 pm will be answered the following business day.   You may also send [place] a message via MyChart. We typically respond to MyChart messages within 1-2 business days.  For prescription refills, please ask your pharmacy to contact our office. Our fax number is [phone].  If you have an urgent issue when the clinic is closed that cannot wait until the next business day, you can page your doctor at the number below.    Please note that while we do our best to be available for urgent issues outside of office hours, we are not available 24/7.   If you have an urgent issue and are unable to reach [place], you may choose to seek medical care at your doctor's office, retail clinic, urgent care center, or emergency room.  If you have a medical emergency, please immediately call 911 or go to the emergency department. In the event of inclement weather, please call our main line at [phone] for an update on the status of any delays or closures.  Dermatology Medication Tips: Please keep the boxes that topical medications come in in order to help keep track  of the instructions about where and how to use these. Pharmacies typically print the medication instructions only on the boxes and not directly on the medication tubes.   If your medication is too expensive, please contact our office at [phone] or send [place] a message through MyChart.   We are unable to tell what your co-pay for medications will be in advance as this is different  depending on your insurance coverage. However, we may be able to find a substitute medication at lower cost or fill out paperwork to get insurance to cover a needed medication.   If a prior authorization is required to get your medication covered by your insurance company, please allow [place] 1-2 business days to complete this process.  Drug prices often vary depending on where the prescription is filled and some pharmacies may offer cheaper prices.  The website www.goodrx.com contains coupons for medications through different pharmacies. The prices here do not account for what the cost may be with help from insurance (it may be cheaper with your insurance), but the website can give you the price if you did not use any insurance.  - You can print the associated coupon and take it with your prescription to the pharmacy.  - You may also stop by our office during regular business hours and pick up a GoodRx coupon card.  - If you need your prescription sent electronically to a different pharmacy, notify our office through [facility] or by phone at [phone]

## 2023-03-11 NOTE — Progress Notes (Signed)
   Follow-Up Visit   Subjective  Caitlyn Richardson is a 42 y.o. female who presents for the following:  Patient present today for follow up visit for follow up. Patient was last evaluated on 01/28/23. Patient reports sxs are better. She has been using the Tacrolimus 0.1%ointment and she has seen improvement. Patient denies medication changes.  The following portions of the chart were reviewed this encounter and updated as appropriate: medications, allergies, medical  Review of Systems:  No other skin or systemic complaints except as noted in HPI or Assessment and Plan.  Objective  Well appearing patient in no apparent distress; mood and affect are within normal limits.    A focused examination was performed of the following areas: face   Relevant exam findings are noted in the Assessment and Plan.       Assessment & Plan   Lichen Simplex Chronicus (Improved) - Continue Tacrolimus 0.1% ointment to continue calming inflammation - Melaxemic lightening cream Rx'd  - Pt instructed to use pea sized amount all over face. Rx sent to [facility]. - Use hyaluronic acid to assist with facial hydration - Samples of La Roche Posay Triple Moisture provided.  - Follow up in 13mo  Post-Inflammatory Hyperpigmentation (Peri-ocular) Plan: Counseling I counseled the patient regarding the following: Skin care: Recommend minimizing sun exposure, wearing sunscreen and protective clothing. / Expectations: Post Inflammatory pigmentary change is lighter or darker discoloration than surrounding skin resulting from trauma or rashes. Areas tend to normalize over time, but can take months to years.  I recommended the following: Broad Spectrum Sunscreen SPF 30+ Topical Retinoids  Acne    - Assessment: Scatted papules / open comedones.    - Plan: Utilize the tretinoin component of the prescribed lightening cream to address acne concerns. No oral acne medication is deemed necessary at this time.  Return in  about 3 months (around 06/11/2023).    Documentation: I have reviewed the above documentation for accuracy and completeness, and I agree with the above.   I, Shirron Marcha Solders, CMA, am acting as scribe for [employer], DO.   Langston Reusing, DO

## 2023-03-12 ENCOUNTER — Ambulatory Visit (HOSPITAL_BASED_OUTPATIENT_CLINIC_OR_DEPARTMENT_OTHER): Payer: 59 | Admitting: Family Medicine

## 2023-03-12 ENCOUNTER — Encounter (HOSPITAL_BASED_OUTPATIENT_CLINIC_OR_DEPARTMENT_OTHER): Payer: Self-pay | Admitting: Family Medicine

## 2023-03-12 ENCOUNTER — Ambulatory Visit: Payer: 59 | Admitting: Licensed Clinical Social Worker

## 2023-03-12 VITALS — BP 138/91 | HR 65 | Ht 66.5 in | Wt 195.8 lb

## 2023-03-12 DIAGNOSIS — Z0000 Encounter for general adult medical examination without abnormal findings: Secondary | ICD-10-CM

## 2023-03-12 DIAGNOSIS — M25561 Pain in right knee: Secondary | ICD-10-CM | POA: Diagnosis not present

## 2023-03-12 DIAGNOSIS — F339 Major depressive disorder, recurrent, unspecified: Secondary | ICD-10-CM

## 2023-03-12 DIAGNOSIS — M5416 Radiculopathy, lumbar region: Secondary | ICD-10-CM | POA: Insufficient documentation

## 2023-03-12 DIAGNOSIS — E559 Vitamin D deficiency, unspecified: Secondary | ICD-10-CM

## 2023-03-12 NOTE — Progress Notes (Signed)
    Procedures performed today:    None.  Independent interpretation of notes and tests performed by another provider:   None.  Brief History, Exam, Impression, and Recommendations:    BP (!) 138/91 (BP Location: Right Arm, Patient Position: Sitting, Cuff Size: Normal)   Pulse 65   Ht 5' 6.5" (1.689 m)   Wt 195 lb 12.8 oz (88.8 kg)   LMP 09/22/2021 (Approximate)   SpO2 100%   BMI 31.13 kg/m   Depression, recurrent (HCC) Assessment & Plan: At prior visit, patient had new life circumstances/stressors occur with recent passing of her father as well as cousin.  She previously was managing well with use of Effexor and use of Xanax as needed, which previously was fairly infrequent.  She was also utilizing hydroxyzine to help with sleep at night.  She reports that sleep has been negatively affected with these changes, does not find the hydroxyzine to be very helpful.   We did refer patient for psychiatry and psychology - she was able to arrange evaluation with psychology and continues with this. However, she indicates that she has not heard from psychiatry about scheduling this appointment. She continues to feel that symptoms are not adequately controlled at present. We will look further into previously placed referral to assist with arranging psychiatry evaluation   Wellness examination -     CBC with Differential/Platelet; Future -     Comprehensive metabolic panel; Future -     Hemoglobin A1c; Future -     Lipid panel; Future -     TSH Rfx on Abnormal to Free T4; Future  Vitamin D deficiency Assessment & Plan: Has been taking prescribed vit D supplement. Did not have lab checked prior to today's appointment. We will check vit D today. Plan to adjust supplementation based on result.  Orders: -     VITAMIN D 25 Hydroxy (Vit-D Deficiency, Fractures)  Acute pain of right knee Assessment & Plan: Patient reports that she has been having knee pain for about 1 month.  She does not  recall any inciting event.  Pain is primarily over anterior/medial aspect of knee.  She will note some stiffness and soreness after prolonged sitting.  Some worsening with increased activity.  Knee has felt tight, no swelling noted however.  Has not tried any specific interventions.  Denies any history of knee issues or injuries in the past. On exam, Right knee: No obvious deformity. No effusion.  Positive patellar grind.  Negative crepitus. Full ROM for flexion and extension.  Strength 5 out of 5 for flexion and extension. Anterior drawer: Negative Posterior drawer: Negative Lachman: Negative Varus stress test: Negative Valgus stress test: Negative McMurray's: Negative Neurovascularly intact.  No evidence of lymphatic disease.  Suspect possible underlying PFPS.  Given this, recommend initial evaluation and treatment with physical therapy.  Patient amenable to this, referral placed.  Advised on home exercise program as per PT.  Can consider using anti-inflammatory to help with controlling pain.  Will plan for monitoring progress at next appointment.  Could consider proceeding with imaging if not responding as expected  Orders: -     Ambulatory referral to Physical Therapy  Return in about 3 months (around 06/12/2023) for CPE with fasting labs 1 week prior.   ___________________________________________ Katrell Milhorn de [place], MD, ABFM, CAQSM Primary Care and Sports Medicine [facility]

## 2023-03-12 NOTE — Assessment & Plan Note (Signed)
Has been taking prescribed vit D supplement. Did not have lab checked prior to today's appointment. We will check vit D today. Plan to adjust supplementation based on result.

## 2023-03-12 NOTE — Assessment & Plan Note (Signed)
Patient reports that she has been having knee pain for about 1 month.  She does not recall any inciting event.  Pain is primarily over anterior/medial aspect of knee.  She will note some stiffness and soreness after prolonged sitting.  Some worsening with increased activity.  Knee has felt tight, no swelling noted however.  Has not tried any specific interventions.  Denies any history of knee issues or injuries in the past. On exam, Right knee: No obvious deformity. No effusion.  Positive patellar grind.  Negative crepitus. Full ROM for flexion and extension.  Strength 5 out of 5 for flexion and extension. Anterior drawer: Negative Posterior drawer: Negative Lachman: Negative Varus stress test: Negative Valgus stress test: Negative McMurray's: Negative Neurovascularly intact.  No evidence of lymphatic disease.  Suspect possible underlying PFPS.  Given this, recommend initial evaluation and treatment with physical therapy.  Patient amenable to this, referral placed.  Advised on home exercise program as per PT.  Can consider using anti-inflammatory to help with controlling pain.  Will plan for monitoring progress at next appointment.  Could consider proceeding with imaging if not responding as expected

## 2023-03-12 NOTE — Assessment & Plan Note (Signed)
At prior visit, patient had new life circumstances/stressors occur with recent passing of her father as well as cousin.  She previously was managing well with use of Effexor and use of Xanax as needed, which previously was fairly infrequent.  She was also utilizing hydroxyzine to help with sleep at night.  She reports that sleep has been negatively affected with these changes, does not find the hydroxyzine to be very helpful.   We did refer patient for psychiatry and psychology - she was able to arrange evaluation with psychology and continues with this. However, she indicates that she has not heard from psychiatry about scheduling this appointment. She continues to feel that symptoms are not adequately controlled at present. We will look further into previously placed referral to assist with arranging psychiatry evaluation

## 2023-03-13 LAB — VITAMIN D 25 HYDROXY (VIT D DEFICIENCY, FRACTURES): Vit D, 25-Hydroxy: 31.1 ng/mL (ref 30.0–100.0)

## 2023-03-19 ENCOUNTER — Ambulatory Visit
Admission: RE | Admit: 2023-03-19 | Discharge: 2023-03-19 | Disposition: A | Discharge status: Home / Self Care | Payer: 59 | Source: Ambulatory Visit | Attending: Certified Nurse Midwife | Admitting: Certified Nurse Midwife

## 2023-03-19 ENCOUNTER — Other Ambulatory Visit (HOSPITAL_BASED_OUTPATIENT_CLINIC_OR_DEPARTMENT_OTHER): Payer: Self-pay | Admitting: Certified Nurse Midwife

## 2023-03-19 DIAGNOSIS — N6452 Nipple discharge: Secondary | ICD-10-CM

## 2023-03-19 DIAGNOSIS — N6310 Unspecified lump in the right breast, unspecified quadrant: Secondary | ICD-10-CM

## 2023-03-19 DIAGNOSIS — N6311 Unspecified lump in the right breast, upper outer quadrant: Secondary | ICD-10-CM | POA: Diagnosis not present

## 2023-03-20 ENCOUNTER — Encounter (HOSPITAL_BASED_OUTPATIENT_CLINIC_OR_DEPARTMENT_OTHER): Payer: Self-pay | Admitting: Family Medicine

## 2023-03-25 ENCOUNTER — Ambulatory Visit: Payer: 59 | Admitting: Licensed Clinical Social Worker

## 2023-03-25 NOTE — Pre-Procedure Instructions (Signed)
Surgical Instructions   Your procedure is scheduled on April 01, 2023. Report to [facility] Main Entrance "A" at 5:30 A.M., then check in with the Admitting office. Any questions or running late day of surgery: call [phone]  Questions prior to your surgery date: call [phone], Monday-Friday, 8am-4pm. If you experience any cold or flu symptoms such as cough, fever, chills, shortness of breath, etc. between now and your scheduled surgery, please notify us at the above number.     Remember:  Do not eat after midnight the night before your surgery   You may drink clear liquids until 4:30 AM the morning of your surgery.   Clear liquids allowed are: Water, Non-Citrus Juices (without pulp), Carbonated Beverages, Clear Tea (no milk, honey, etc.), Black Coffee Only (NO MILK, CREAM OR POWDERED CREAMER of any kind), and Gatorade.    Take these medicines the morning of surgery with A SIP OF WATER: amLODipine (NORVASC)  venlafaxine [facility])    May take these medicines IF NEEDED: ALPRAZolam Prudy Feeler)    One week prior to surgery, STOP taking any Aspirin (unless otherwise instructed by your surgeon) Aleve, Naproxen, Ibuprofen, Motrin, Advil, Goody's, BC's, all herbal medications, fish oil, and non-prescription vitamins.                     Do NOT Smoke (Tobacco/Vaping) for 24 hours prior to your procedure.  If you use a CPAP at night, you may bring your mask/headgear for your overnight stay.   You will be asked to remove any contacts, glasses, piercing's, hearing aid's, dentures/partials prior to surgery. Please bring cases for these items if needed.    Patients discharged the day of surgery will not be allowed to drive home, and someone needs to stay with them for 24 hours.  SURGICAL WAITING ROOM VISITATION Patients may have no more than 2 support people in the waiting area - these visitors may rotate.   Pre-op nurse will coordinate an appropriate time for 1 ADULT support  person, who may not rotate, to accompany patient in pre-op.  Children under the age of 36 must have an adult with them who is not the patient and must remain in the main waiting area with an adult.  If the patient needs to stay at the hospital during part of their recovery, the visitor guidelines for inpatient rooms apply.  Please refer to the [facility] website for the visitor guidelines for any additional information.   If you received a COVID test during your pre-op visit  it is requested that you wear a mask when out in public, stay away from anyone that may not be feeling well and notify your surgeon if you develop symptoms. If you have been in contact with anyone that has tested positive in the last 10 days please notify you surgeon.      Pre-operative CHG Bathing Instructions   You can play a key role in reducing the risk of infection after surgery. Your skin needs to be as free of germs as possible. You can reduce the number of germs on your skin by washing with CHG (chlorhexidine gluconate) soap before surgery. CHG is an antiseptic soap that kills germs and continues to kill germs even after washing.   DO NOT use if you have an allergy to chlorhexidine/CHG or antibacterial soaps. If your skin becomes reddened or irritated, stop using the CHG and notify one of our RNs at [phone].  TAKE A SHOWER THE NIGHT BEFORE SURGERY AND THE DAY OF SURGERY    Please keep in mind the following:  DO NOT shave, including legs and underarms, 48 hours prior to surgery.   You may shave your face before/day of surgery.  Place clean sheets on your bed the night before surgery Use a clean washcloth (not used since being washed) for each shower. DO NOT sleep with pet's night before surgery.  CHG Shower Instructions:  Wash your face and private area with normal soap. If you choose to wash your hair, wash first with your normal shampoo.  After you use shampoo/soap, rinse your hair and  body thoroughly to remove shampoo/soap residue.  Turn the water OFF and apply half the bottle of CHG soap to a CLEAN washcloth.  Apply CHG soap ONLY FROM YOUR NECK DOWN TO YOUR TOES (washing for 3-5 minutes)  DO NOT use CHG soap on face, private areas, open wounds, or sores.  Pay special attention to the area where your surgery is being performed.  If you are having back surgery, having someone wash your back for you may be helpful. Wait 2 minutes after CHG soap is applied, then you may rinse off the CHG soap.  Pat dry with a clean towel  Put on clean pajamas    Additional instructions for the day of surgery: DO NOT APPLY any lotions, deodorants, cologne, or perfumes.   Do not wear jewelry or makeup Do not wear nail polish, gel polish, artificial nails, or any other type of covering on natural nails (fingers and toes) Do not bring valuables to the hospital. [facility] is not responsible for valuables/personal belongings. Put on clean/comfortable clothes.  Please brush your teeth.  Ask your nurse before applying any prescription medications to the skin.

## 2023-03-26 ENCOUNTER — Other Ambulatory Visit: Payer: Self-pay

## 2023-03-26 ENCOUNTER — Encounter (HOSPITAL_COMMUNITY): Payer: Self-pay | Admitting: Vascular Surgery

## 2023-03-26 ENCOUNTER — Ambulatory Visit
Admission: RE | Admit: 2023-03-26 | Discharge: 2023-03-26 | Disposition: A | Discharge status: Home / Self Care | Payer: 59 | Source: Ambulatory Visit | Attending: Certified Nurse Midwife | Admitting: Certified Nurse Midwife

## 2023-03-26 ENCOUNTER — Other Ambulatory Visit (HOSPITAL_BASED_OUTPATIENT_CLINIC_OR_DEPARTMENT_OTHER): Payer: Self-pay | Admitting: *Deleted

## 2023-03-26 ENCOUNTER — Encounter (HOSPITAL_COMMUNITY): Payer: Self-pay

## 2023-03-26 ENCOUNTER — Telehealth (HOSPITAL_BASED_OUTPATIENT_CLINIC_OR_DEPARTMENT_OTHER): Payer: Self-pay | Admitting: Family Medicine

## 2023-03-26 ENCOUNTER — Encounter (HOSPITAL_COMMUNITY)
Admission: RE | Admit: 2023-03-26 | Discharge: 2023-03-26 | Disposition: A | Discharge status: Home / Self Care | Payer: 59 | Source: Ambulatory Visit | Attending: Orthopedic Surgery | Admitting: Orthopedic Surgery

## 2023-03-26 VITALS — BP 151/88 | HR 67 | Temp 98.8°F | Resp 17 | Ht 66.5 in | Wt 193.9 lb

## 2023-03-26 DIAGNOSIS — N6311 Unspecified lump in the right breast, upper outer quadrant: Secondary | ICD-10-CM | POA: Diagnosis not present

## 2023-03-26 DIAGNOSIS — N6310 Unspecified lump in the right breast, unspecified quadrant: Secondary | ICD-10-CM

## 2023-03-26 DIAGNOSIS — E876 Hypokalemia: Secondary | ICD-10-CM

## 2023-03-26 DIAGNOSIS — I2510 Atherosclerotic heart disease of native coronary artery without angina pectoris: Secondary | ICD-10-CM | POA: Diagnosis not present

## 2023-03-26 DIAGNOSIS — Z01818 Encounter for other preprocedural examination: Secondary | ICD-10-CM | POA: Diagnosis present

## 2023-03-26 DIAGNOSIS — D241 Benign neoplasm of right breast: Secondary | ICD-10-CM | POA: Diagnosis not present

## 2023-03-26 DIAGNOSIS — Z01812 Encounter for preprocedural laboratory examination: Secondary | ICD-10-CM | POA: Diagnosis not present

## 2023-03-26 HISTORY — PX: BREAST BIOPSY: SHX20

## 2023-03-26 LAB — BASIC METABOLIC PANEL
Anion gap: 11 (ref 5–15)
BUN: 12 mg/dL (ref 6–20)
CO2: 27 mmol/L (ref 22–32)
Calcium: 9.7 mg/dL (ref 8.9–10.3)
Chloride: 99 mmol/L (ref 98–111)
Creatinine, Ser: 0.76 mg/dL (ref 0.44–1.00)
GFR, Estimated: >60 mL/min (ref >60)
Glucose, Bld: 84 mg/dL (ref 70–99)
Potassium: 2.6 mmol/L — CL (ref 3.5–5.1)
Sodium: 137 mmol/L (ref 135–145)

## 2023-03-26 LAB — CBC
HCT: 39.5 % (ref 36.0–46.0)
Hemoglobin: 13.3 g/dL (ref 12.0–15.0)
MCH: 29.8 pg (ref 26.0–34.0)
MCHC: 33.7 g/dL (ref 30.0–36.0)
MCV: 88.6 fL (ref 80.0–100.0)
Platelets: 367 10*3/uL (ref 150–400)
RBC: 4.46 MIL/uL (ref 3.87–5.11)
RDW: 12.3 % (ref 11.5–15.5)
WBC: 8.2 10*3/uL (ref 4.0–10.5)
nRBC: 0 % (ref 0.0–0.2)

## 2023-03-26 NOTE — Progress Notes (Addendum)
PCP - Dr. Ceasar Mons [place] Cardiologist - Dr. Jodelle Red - Last office visit 12/18/2022 - Recently established for CP. All work-up normal. PRN follow-up  PPM/ICD - Denies Device Orders - n/a Rep Notified - n/a  Chest x-ray - 09/20/2022 EKG - 12/18/2022 Stress Test - Denies ECHO - Denies Cardiac Cath - Denies Coronary CT - 12/30/2022  Sleep Study - Pt completed a home sleep study October 2024 and is awaiting results.  No DM  Last dose of GLP1 agonist- n/a GLP1 instructions: n/a  Blood Thinner Instructions: n/a Aspirin Instructions: n/a  ERAS Protcol - Clear liquids until 0430 morning of surgery PRE-SURGERY Ensure or G2- n/a  COVID TEST- n/a   Anesthesia review: Yes. Recently established with Cardiology with cardiac testing. Critical Potassium result of 2.6. PCP notified.  Patient denies shortness of breath, fever, cough and chest pain at PAT appointment. Pt denies any respiratory illness/infection in the last two months.   All instructions explained to the patient, with a verbal understanding of the material. Patient agrees to go over the instructions while at home for a better understanding. Patient also instructed to self quarantine after being tested for COVID-19. The opportunity to ask questions was provided.

## 2023-03-26 NOTE — Progress Notes (Addendum)
Critical Potassium result of 2.6. Kenn File with Dr. Glenna Durand office made aware and she will notify surgeon. Per Aundra Millet, they do not replace abnormal potassium levels and result will need to be relayed to pts PCP. Dr. De [place] made aware of critical potassium level.

## 2023-03-26 NOTE — Telephone Encounter (Signed)
Received notification from lab.  Patient's surgeon had labs completed preoperatively.  BMP returned with critically low potassium and laboratory attempted to notify surgeon who ordered the labs.  Surgeon advised the lab that they do not do any electrolyte repletion if abnormalities are noted on the labs ordered by them.  They indicated that if laboratory abnormalities were observed on labs which they had ordered and requested, then to contact our office as patient PCP to address the abnormalities which were found from their preoperative testing. Reviewed lab findings from labs which were ordered by her surgeon, but does not address electrolyte abnormalities if observed.  Generally, labs were reassuring except for notably low potassium.  CMA did reach out to patient regarding laboratory abnormality.  She is not currently having any symptoms.  We will plan to recheck labs to verify lab findings.  We will also check magnesium level as this could be present in settings of low potassium.  If electrolyte disturbance persists, we will plan to replete as indicated.

## 2023-03-27 ENCOUNTER — Other Ambulatory Visit (HOSPITAL_BASED_OUTPATIENT_CLINIC_OR_DEPARTMENT_OTHER): Payer: Self-pay

## 2023-03-27 ENCOUNTER — Encounter (HOSPITAL_BASED_OUTPATIENT_CLINIC_OR_DEPARTMENT_OTHER): Payer: Self-pay

## 2023-03-27 DIAGNOSIS — E876 Hypokalemia: Secondary | ICD-10-CM

## 2023-03-27 DIAGNOSIS — I10 Essential (primary) hypertension: Secondary | ICD-10-CM

## 2023-03-27 LAB — MAGNESIUM: Magnesium: 2.2 mg/dL (ref 1.6–2.3)

## 2023-03-27 LAB — BASIC METABOLIC PANEL
BUN/Creatinine Ratio: 15 (ref 9–23)
BUN: 14 mg/dL (ref 6–24)
CO2: 25 mmol/L (ref 20–29)
Calcium: 10 mg/dL (ref 8.7–10.2)
Chloride: 99 mmol/L (ref 96–106)
Creatinine, Ser: 0.94 mg/dL (ref 0.57–1.00)
Glucose: 92 mg/dL (ref 70–99)
Potassium: 2.9 mmol/L — ABNORMAL LOW (ref 3.5–5.2)
Sodium: 145 mmol/L — ABNORMAL HIGH (ref 134–144)
eGFR: 77 mL/min/{1.73_m2} (ref >59)

## 2023-03-27 LAB — SURGICAL PATHOLOGY

## 2023-03-27 MED ORDER — AMLODIPINE BESYLATE 5 MG PO TABS
10.0000 mg | ORAL_TABLET | Freq: Every day | ORAL | Status: DC
Start: 2023-03-27 — End: 2023-04-14

## 2023-03-27 MED ORDER — OLMESARTAN MEDOXOMIL 40 MG PO TABS
40.0000 mg | ORAL_TABLET | Freq: Every day | ORAL | 1 refills | Status: DC
  Filled 2023-03-27: qty 30, 30d supply, fill #0

## 2023-03-27 MED ORDER — POTASSIUM CHLORIDE ER 10 MEQ PO TBCR
20.0000 meq | EXTENDED_RELEASE_TABLET | Freq: Two times a day (BID) | ORAL | 0 refills | Status: DC
  Filled 2023-03-27: qty 12, 3d supply, fill #0

## 2023-03-27 NOTE — Telephone Encounter (Signed)
Discussed with [place]. History of hypokalemia dating back to 06/2021. Has difficulty with size of KClor and last took July or August. BP has not been controlled at office visits since 12/30/22. Unfortunately, recently surgery was cancelled due to hypokalemia 03/26/23 K 2.6 and on recheck 2.9.  STOP Olmesartan-hydrochlorothiazide START Olmesartan one 40mg  tablet daily CHANGE Amlodipine to two 5mg  tablets daily START Potassium two tablets daily for 3 days   Prescriptions sent to pharmacy. PCP has already ordered repeat labs for Monday to reassess.   If BP not controlled on this regimen, plan to add Spironolactone.   Alver Sorrow, NP

## 2023-03-30 ENCOUNTER — Encounter (HOSPITAL_BASED_OUTPATIENT_CLINIC_OR_DEPARTMENT_OTHER): Payer: Self-pay | Admitting: Family Medicine

## 2023-03-30 NOTE — Addendum Note (Signed)
Addended by: Alver Sorrow on: 03/30/2023 01:11 PM   Modules accepted: Orders

## 2023-04-01 ENCOUNTER — Encounter (HOSPITAL_COMMUNITY): Admission: RE | Payer: Self-pay | Source: Home / Self Care

## 2023-04-01 ENCOUNTER — Ambulatory Visit (HOSPITAL_COMMUNITY): Admission: RE | Admit: 2023-04-01 | Payer: 59 | Source: Home / Self Care | Admitting: Orthopedic Surgery

## 2023-04-01 ENCOUNTER — Ambulatory Visit: Payer: 59 | Admitting: Licensed Clinical Social Worker

## 2023-04-01 SURGERY — WRIST ARTHROSCOPY WITH DEBRIDEMENT
Anesthesia: Regional | Site: Wrist | Laterality: Right

## 2023-04-13 ENCOUNTER — Other Ambulatory Visit: Payer: Self-pay | Admitting: General Surgery

## 2023-04-13 ENCOUNTER — Other Ambulatory Visit (HOSPITAL_BASED_OUTPATIENT_CLINIC_OR_DEPARTMENT_OTHER): Payer: Self-pay

## 2023-04-13 DIAGNOSIS — N6452 Nipple discharge: Secondary | ICD-10-CM | POA: Diagnosis not present

## 2023-04-13 DIAGNOSIS — D241 Benign neoplasm of right breast: Secondary | ICD-10-CM

## 2023-04-13 DIAGNOSIS — E876 Hypokalemia: Secondary | ICD-10-CM | POA: Diagnosis not present

## 2023-04-13 LAB — BASIC METABOLIC PANEL
BUN/Creatinine Ratio: 15 (ref 9–23)
BUN: 10 mg/dL (ref 6–24)
CO2: 23 mmol/L (ref 20–29)
Calcium: 9.2 mg/dL (ref 8.7–10.2)
Chloride: 105 mmol/L (ref 96–106)
Creatinine, Ser: 0.67 mg/dL (ref 0.57–1.00)
Glucose: 96 mg/dL (ref 70–99)
Potassium: 3.7 mmol/L (ref 3.5–5.2)
Sodium: 143 mmol/L (ref 134–144)
eGFR: 111 mL/min/{1.73_m2} (ref >59)

## 2023-04-13 NOTE — Telephone Encounter (Signed)
I did not receive surgery clearance not sure if [place]?

## 2023-04-14 ENCOUNTER — Telehealth (HOSPITAL_BASED_OUTPATIENT_CLINIC_OR_DEPARTMENT_OTHER): Payer: Self-pay

## 2023-04-14 ENCOUNTER — Other Ambulatory Visit (HOSPITAL_BASED_OUTPATIENT_CLINIC_OR_DEPARTMENT_OTHER): Payer: Self-pay

## 2023-04-14 MED ORDER — AMLODIPINE-OLMESARTAN 10-40 MG PO TABS
1.0000 | ORAL_TABLET | Freq: Every day | ORAL | 3 refills | Status: DC
  Filled 2023-04-14: qty 90, 90d supply, fill #0
  Filled 2023-07-15: qty 90, 90d supply, fill #1
  Filled 2023-10-13: qty 90, 90d supply, fill #2
  Filled 2023-12-31: qty 90, 90d supply, fill #3

## 2023-04-14 NOTE — Telephone Encounter (Signed)
sent in combo medication to preferred pharmacy.

## 2023-04-14 NOTE — Telephone Encounter (Signed)
-----   Message from Alver Sorrow sent at 04/14/2023  8:11 AM EST ----- Normal kidney function.  Potassium and sodium have normalized.  Recommend consolidate medications to Olmesartan-Amlodipine 40-10mg  daily.   If BP not at goal <130/80, recommend addition of Spironolactone 12.5mg  daily with repeat BMP in 1-2 weeks.  If BP close to goal, also reasonable to monitor for 2 weeks without medication changes.   Okay to remove potassium from med list as she has completed course (was only Rx'd for 3 days)

## 2023-04-16 ENCOUNTER — Telehealth: Payer: Self-pay | Admitting: Family

## 2023-04-16 ENCOUNTER — Ambulatory Visit: Payer: 59 | Admitting: Licensed Clinical Social Worker

## 2023-04-16 NOTE — Telephone Encounter (Signed)
Wrist arthroscopy with joint debridement previously cancelled for hypokalemia.    03/26/23 K 2.6 ? 03/31/23 K 2.9 ? Hydrochlorothiazide discontinued and Potassium supplement provided ? 04/13/23 K 3.7. Potassium has now normalized. Anticipate prior Rx hydrochlorothiazide was cause of hypokalemia.  Manual BP today 132/74 on present regimen Amlodipine-Olmesartan 10-40mg  daily.   Exercise tolerance >4 METS. Acceptable risk for planned procedure without additional cardiovascular testing.   Will route to surgical team for assistance in rescheduling her procedure. CCing Dr. Ihor Dow her PCP as [facility] Childes.   Alver Sorrow, NP

## 2023-04-18 NOTE — Telephone Encounter (Signed)
Beth, please advise if you have received pt's sleep study results.

## 2023-04-20 ENCOUNTER — Telehealth (HOSPITAL_BASED_OUTPATIENT_CLINIC_OR_DEPARTMENT_OTHER): Payer: Self-pay

## 2023-04-20 ENCOUNTER — Other Ambulatory Visit (HOSPITAL_BASED_OUTPATIENT_CLINIC_OR_DEPARTMENT_OTHER): Payer: Self-pay

## 2023-04-20 ENCOUNTER — Other Ambulatory Visit (HOSPITAL_BASED_OUTPATIENT_CLINIC_OR_DEPARTMENT_OTHER): Payer: Self-pay | Admitting: Certified Nurse Midwife

## 2023-04-20 DIAGNOSIS — I10 Essential (primary) hypertension: Secondary | ICD-10-CM

## 2023-04-20 MED ORDER — SPIRONOLACTONE 25 MG PO TABS
12.5000 mg | ORAL_TABLET | Freq: Every day | ORAL | 1 refills | Status: AC
  Filled 2023-04-20: qty 45, 90d supply, fill #0
  Filled 2023-08-01 (×2): qty 45, 90d supply, fill #1
  Filled 2023-10-28: qty 45, 90d supply, fill #2
  Filled 2023-12-31 – 2024-01-08 (×3): qty 45, 90d supply, fill #3

## 2023-04-20 NOTE — Telephone Encounter (Signed)
I do not see that I have results back  I will cc [facility] team

## 2023-04-20 NOTE — Telephone Encounter (Signed)
Will add Cleda Daub per pt's request with repeat BMP in 1-2 weeks per APP's recommendations.

## 2023-04-20 NOTE — Telephone Encounter (Signed)
HST showed mild OSA, average 9 apneic events per hours with mild oxygen desaturation with spo2 low 90%  Treatment options very including weight loss, oral appliance, CPAP therapy referral to ENT for possible surgical options.  If sleep is disrupted and patient complains of daytime sleepiness would recommend either referral to orthodontics for oral appliance or starting auto CPAP(pressure 5-15cm h20) nightly 4 to 6 hours with a follow-up in 31 to 90 days for compliance check.  If patient has any questions and wants to discuss treatment options in greater detail please book first available follow-up with me

## 2023-04-20 NOTE — Telephone Encounter (Signed)
-----   Message from Caitlyn Richardson sent at 04/14/2023  8:11 AM EST ----- Normal kidney function.  Potassium and sodium have normalized.  Recommend consolidate medications to Olmesartan-Amlodipine 40-10mg  daily.   If BP not at goal <130/80, recommend addition of Spironolactone 12.5mg  daily with repeat BMP in 1-2 weeks.  If BP close to goal, also reasonable to monitor for 2 weeks without medication changes.   Okay to remove potassium from med list as she has completed course (was only Rx'd for 3 days)

## 2023-04-21 ENCOUNTER — Encounter (HOSPITAL_BASED_OUTPATIENT_CLINIC_OR_DEPARTMENT_OTHER): Payer: Self-pay | Admitting: *Deleted

## 2023-04-21 ENCOUNTER — Encounter (HOSPITAL_BASED_OUTPATIENT_CLINIC_OR_DEPARTMENT_OTHER): Payer: Self-pay | Admitting: Family Medicine

## 2023-04-23 ENCOUNTER — Other Ambulatory Visit (HOSPITAL_BASED_OUTPATIENT_CLINIC_OR_DEPARTMENT_OTHER): Payer: Self-pay

## 2023-04-23 ENCOUNTER — Other Ambulatory Visit: Payer: Self-pay

## 2023-04-23 ENCOUNTER — Other Ambulatory Visit (HOSPITAL_BASED_OUTPATIENT_CLINIC_OR_DEPARTMENT_OTHER): Payer: Self-pay | Admitting: Family

## 2023-04-23 DIAGNOSIS — I10 Essential (primary) hypertension: Secondary | ICD-10-CM

## 2023-04-23 DIAGNOSIS — F339 Major depressive disorder, recurrent, unspecified: Secondary | ICD-10-CM

## 2023-04-23 MED ORDER — VENLAFAXINE HCL 75 MG PO TABS
75.0000 mg | ORAL_TABLET | Freq: Every day | ORAL | 0 refills | Status: DC
  Filled 2023-04-23: qty 1, 1d supply, fill #0

## 2023-04-23 MED ORDER — OLMESARTAN MEDOXOMIL 40 MG PO TABS
40.0000 mg | ORAL_TABLET | Freq: Every day | ORAL | 0 refills | Status: DC
  Filled 2023-04-23: qty 1, 1d supply, fill #0

## 2023-04-23 MED ORDER — AMLODIPINE BESYLATE 10 MG PO TABS
10.0000 mg | ORAL_TABLET | Freq: Every day | ORAL | 0 refills | Status: DC
  Filled 2023-04-23: qty 1, 1d supply, fill #0

## 2023-04-23 NOTE — Progress Notes (Signed)
Patient and staff member with missed doses of medications this AM. Single tab sent to pharmacy to ensure no missed doses. Further refills of Venlafaxine per PCP team.   Alver Sorrow, NP

## 2023-04-23 NOTE — Addendum Note (Signed)
Addended by: Alver Sorrow on: 04/23/2023 09:44 AM   Modules accepted: Orders

## 2023-04-30 ENCOUNTER — Ambulatory Visit: Payer: 59 | Admitting: Licensed Clinical Social Worker

## 2023-05-01 DIAGNOSIS — I10 Essential (primary) hypertension: Secondary | ICD-10-CM | POA: Diagnosis not present

## 2023-05-02 LAB — BASIC METABOLIC PANEL
BUN/Creatinine Ratio: 18 (ref 9–23)
BUN: 13 mg/dL (ref 6–24)
CO2: 25 mmol/L (ref 20–29)
Calcium: 9.9 mg/dL (ref 8.7–10.2)
Chloride: 100 mmol/L (ref 96–106)
Creatinine, Ser: 0.71 mg/dL (ref 0.57–1.00)
Glucose: 170 mg/dL — ABNORMAL HIGH (ref 70–99)
Potassium: 3.4 mmol/L — ABNORMAL LOW (ref 3.5–5.2)
Sodium: 142 mmol/L (ref 134–144)
eGFR: 108 mL/min/{1.73_m2} (ref >59)

## 2023-05-14 ENCOUNTER — Ambulatory Visit (INDEPENDENT_AMBULATORY_CARE_PROVIDER_SITE_OTHER): Payer: 59 | Admitting: Licensed Clinical Social Worker

## 2023-05-14 DIAGNOSIS — F339 Major depressive disorder, recurrent, unspecified: Secondary | ICD-10-CM

## 2023-05-14 DIAGNOSIS — F411 Generalized anxiety disorder: Secondary | ICD-10-CM | POA: Diagnosis not present
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
## 2023-05-19 ENCOUNTER — Encounter (HOSPITAL_BASED_OUTPATIENT_CLINIC_OR_DEPARTMENT_OTHER): Payer: Self-pay | Admitting: *Deleted

## 2023-05-22 ENCOUNTER — Telehealth (INDEPENDENT_AMBULATORY_CARE_PROVIDER_SITE_OTHER): Payer: 59 | Admitting: Primary Care

## 2023-05-22 DIAGNOSIS — G4730 Sleep apnea, unspecified: Secondary | ICD-10-CM | POA: Diagnosis not present

## 2023-05-22 NOTE — Progress Notes (Signed)
Virtual Visit via Video Note  I connected with Caitlyn Richardson on 05/22/23 at  2:15 PM EST by a video enabled telemedicine application and verified that I am speaking with the correct person using two identifiers.  Location: Patient: Home Provider: Office   I discussed the limitations of evaluation and management by telemedicine and the availability of in person appointments. The patient expressed understanding and agreed to proceed.  History of Present Illness: 43 year old female, never smoked/ passive exposure.  Past medical history significant for hypertension, insomnia, daytime somnolence, hyperlipidemia, vitamin D deficiency.  Previous [facility] pulmonary encounter:  02/11/2023 Patient is today for sleep consult.  She had has a hard time falling and staying asleep.  She has been told that she snores loudly.  She has never woken herself up gasping or choking.  Sleep can be restless, she wakes up between 1-3 times a night.  Typical bedtime is between 10 PM and 12 AM.  It takes her on average 30 to 35 minutes to fall asleep.  She starts her day at 5:45 AM.  No previous sleep studies.  She does not wear CPAP or oxygen.  No concern for narcolepsy, cataplexy or sleepwalking. Ep[place] 8   Social hx:  Patient is single, she has children.  Lives with her 2 daughters and mother.  She works as a Clinical biochemist.  No recent travel.  She drinks 1 to 2 glasses of wine per week.  No tobacco use.  05/22/2023- Interim hx  Discussed the use of AI scribe software for clinical note transcription with the patient, who gave verbal consent to proceed.  History of Present Illness   The patient underwent home sleep study on 02/20/2023 that showed mild obstructive sleep apnea, AHI 9/hour SpO2 low 90% (patient spent 0 minutes with oxygen level less than 88%). The patient does not report any significant daytime fatigue or somnolence. She has been managing her condition conservatively, focusing on maintaining a normal BMI and avoiding  sleeping on her back. The patient is primarily a stomach sleeper and has had some dental work done, which may affect the fitting of a mouth guard for sleep apnea. She has not reported any significant cardiac history. The patient's sleep apnea symptoms have not worsened, and she has not experienced any episodes of waking up gasping or choking.   Observations/Objective:  Appears well without overt respiratory symptoms   Assessment and Plan:  1. Mild sleep apnea (Primary)  Mild Sleep Apnea Patient had home sleep study on 02/20/2023 that showed mild obstructive sleep apnea, AHI 9/hour SpO2 low 90% (patient spent 0 minutes with oxygen level less than 88%).  She denies significant daytime fatigue. Reviewed sleep study results. Discussed the risks/benefits of various treatment options including weight loss, positional changes, dental mouth guard, and CPAP. She would not be a good candidate for oral appliance due to recent dental work. Patient is not interested in CPAP therapy at this time, agree conservative treatment is ok as she is largely asymptomatic. Encourage weight loss 15-20lbs and advised she focus on side sleeping position. Avoid excessive alcohol before bed and use safe driving precautions. Consider a wedge pillow for side sleepers (Med[employer]line).     Follow Up Instructions:  -Follow up if sleep symptoms worsen.    I discussed the assessment and treatment plan with the patient. The patient was provided an opportunity to ask questions and all were answered. The patient agreed with the plan and demonstrated an understanding of the instructions.   The patient  was advised to call back or seek an in-person evaluation if the symptoms worsen or if the condition fails to improve as anticipated.  I provided 25 minutes of non-face-to-face time during this encounter.   Glenford Bayley, NP

## 2023-05-28 ENCOUNTER — Ambulatory Visit: Payer: 59 | Admitting: Licensed Clinical Social Worker

## 2023-06-02 ENCOUNTER — Other Ambulatory Visit (HOSPITAL_BASED_OUTPATIENT_CLINIC_OR_DEPARTMENT_OTHER): Payer: Self-pay | Admitting: Family

## 2023-06-02 DIAGNOSIS — E876 Hypokalemia: Secondary | ICD-10-CM | POA: Diagnosis not present

## 2023-06-03 ENCOUNTER — Encounter (HOSPITAL_BASED_OUTPATIENT_CLINIC_OR_DEPARTMENT_OTHER): Payer: Self-pay

## 2023-06-03 LAB — BASIC METABOLIC PANEL
BUN/Creatinine Ratio: 21 (ref 9–23)
BUN: 13 mg/dL (ref 6–24)
CO2: 21 mmol/L (ref 20–29)
Calcium: 10.2 mg/dL (ref 8.7–10.2)
Chloride: 102 mmol/L (ref 96–106)
Creatinine, Ser: 0.62 mg/dL (ref 0.57–1.00)
Glucose: 81 mg/dL (ref 70–99)
Potassium: 3.9 mmol/L (ref 3.5–5.2)
Sodium: 139 mmol/L (ref 134–144)
eGFR: 113 mL/min/{1.73_m2} (ref >59)

## 2023-06-04 ENCOUNTER — Ambulatory Visit: Payer: 59 | Admitting: Licensed Clinical Social Worker

## 2023-06-05 ENCOUNTER — Other Ambulatory Visit (HOSPITAL_BASED_OUTPATIENT_CLINIC_OR_DEPARTMENT_OTHER): Payer: Self-pay | Admitting: Family Medicine

## 2023-06-05 ENCOUNTER — Other Ambulatory Visit (HOSPITAL_BASED_OUTPATIENT_CLINIC_OR_DEPARTMENT_OTHER): Payer: Self-pay | Admitting: Family

## 2023-06-05 ENCOUNTER — Other Ambulatory Visit (HOSPITAL_BASED_OUTPATIENT_CLINIC_OR_DEPARTMENT_OTHER): Payer: Self-pay | Admitting: Nurse Practitioner

## 2023-06-05 DIAGNOSIS — F339 Major depressive disorder, recurrent, unspecified: Secondary | ICD-10-CM

## 2023-06-05 DIAGNOSIS — F413 Other mixed anxiety disorders: Secondary | ICD-10-CM

## 2023-06-05 DIAGNOSIS — Z0000 Encounter for general adult medical examination without abnormal findings: Secondary | ICD-10-CM | POA: Diagnosis not present

## 2023-06-05 DIAGNOSIS — F5102 Adjustment insomnia: Secondary | ICD-10-CM

## 2023-06-05 NOTE — Telephone Encounter (Signed)
Not a patient.

## 2023-06-06 LAB — LIPID PANEL
Chol/HDL Ratio: 7.1 {ratio} — ABNORMAL HIGH (ref 0.0–4.4)
Cholesterol, Total: 226 mg/dL — ABNORMAL HIGH (ref 100–199)
HDL: 32 mg/dL — ABNORMAL LOW (ref >39)
LDL Chol Calc (NIH): 178 mg/dL — ABNORMAL HIGH (ref 0–99)
Triglycerides: 90 mg/dL (ref 0–149)
VLDL Cholesterol Cal: 16 mg/dL (ref 5–40)

## 2023-06-06 LAB — HEMOGLOBIN A1C
Est. average glucose Bld gHb Est-mCnc: 128 mg/dL
Hgb A1c MFr Bld: 6.1 % — ABNORMAL HIGH (ref 4.8–5.6)

## 2023-06-06 LAB — CBC WITH DIFFERENTIAL/PLATELET
Basophils Absolute: 0.1 10*3/uL (ref 0.0–0.2)
Basos: 1 %
EOS (ABSOLUTE): 0.1 10*3/uL (ref 0.0–0.4)
Eos: 1 %
Hematocrit: 38.4 % (ref 34.0–46.6)
Hemoglobin: 13 g/dL (ref 11.1–15.9)
Immature Grans (Abs): 0 10*3/uL (ref 0.0–0.1)
Immature Granulocytes: 0 %
Lymphocytes Absolute: 1.6 10*3/uL (ref 0.7–3.1)
Lymphs: 18 %
MCH: 30.5 pg (ref 26.6–33.0)
MCHC: 33.9 g/dL (ref 31.5–35.7)
MCV: 90 fL (ref 79–97)
Monocytes Absolute: 0.6 10*3/uL (ref 0.1–0.9)
Monocytes: 7 %
Neutrophils Absolute: 6.2 10*3/uL (ref 1.4–7.0)
Neutrophils: 73 %
Platelets: 320 10*3/uL (ref 150–450)
RBC: 4.26 x10E6/uL (ref 3.77–5.28)
RDW: 12.5 % (ref 11.7–15.4)
WBC: 8.6 10*3/uL (ref 3.4–10.8)

## 2023-06-06 LAB — COMPREHENSIVE METABOLIC PANEL
ALT: 19 [IU]/L (ref 0–32)
AST: 17 [IU]/L (ref 0–40)
Albumin: 4.5 g/dL (ref 3.9–4.9)
Alkaline Phosphatase: 95 [IU]/L (ref 44–121)
BUN/Creatinine Ratio: 15 (ref 9–23)
BUN: 11 mg/dL (ref 6–24)
Bilirubin Total: 0.9 mg/dL (ref 0.0–1.2)
CO2: 19 mmol/L — ABNORMAL LOW (ref 20–29)
Calcium: 9.5 mg/dL (ref 8.7–10.2)
Chloride: 103 mmol/L (ref 96–106)
Creatinine, Ser: 0.71 mg/dL (ref 0.57–1.00)
Globulin, Total: 2.8 g/dL (ref 1.5–4.5)
Glucose: 102 mg/dL — ABNORMAL HIGH (ref 70–99)
Potassium: 3.8 mmol/L (ref 3.5–5.2)
Sodium: 139 mmol/L (ref 134–144)
Total Protein: 7.3 g/dL (ref 6.0–8.5)
eGFR: 107 mL/min/{1.73_m2} (ref >59)

## 2023-06-06 LAB — TSH RFX ON ABNORMAL TO FREE T4: TSH: 1.93 u[IU]/mL (ref 0.450–4.500)

## 2023-06-09 ENCOUNTER — Encounter (HOSPITAL_BASED_OUTPATIENT_CLINIC_OR_DEPARTMENT_OTHER): Payer: Self-pay

## 2023-06-09 ENCOUNTER — Other Ambulatory Visit (HOSPITAL_BASED_OUTPATIENT_CLINIC_OR_DEPARTMENT_OTHER): Payer: Self-pay

## 2023-06-09 ENCOUNTER — Other Ambulatory Visit (HOSPITAL_BASED_OUTPATIENT_CLINIC_OR_DEPARTMENT_OTHER): Payer: Self-pay | Admitting: Family Medicine

## 2023-06-09 DIAGNOSIS — F339 Major depressive disorder, recurrent, unspecified: Secondary | ICD-10-CM

## 2023-06-10 ENCOUNTER — Ambulatory Visit: Payer: 59 | Admitting: Licensed Clinical Social Worker

## 2023-06-10 ENCOUNTER — Other Ambulatory Visit (HOSPITAL_BASED_OUTPATIENT_CLINIC_OR_DEPARTMENT_OTHER): Payer: Self-pay

## 2023-06-10 DIAGNOSIS — F411 Generalized anxiety disorder: Secondary | ICD-10-CM

## 2023-06-10 DIAGNOSIS — F339 Major depressive disorder, recurrent, unspecified: Secondary | ICD-10-CM

## 2023-06-10 MED ORDER — VENLAFAXINE HCL 75 MG PO TABS
75.0000 mg | ORAL_TABLET | Freq: Every day | ORAL | 0 refills | Status: DC
  Filled 2023-06-10: qty 30, 30d supply, fill #0
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
## 2023-06-11 ENCOUNTER — Other Ambulatory Visit (HOSPITAL_COMMUNITY): Payer: Self-pay

## 2023-06-11 ENCOUNTER — Encounter (HOSPITAL_BASED_OUTPATIENT_CLINIC_OR_DEPARTMENT_OTHER): Payer: Self-pay | Admitting: Family Medicine

## 2023-06-11 ENCOUNTER — Ambulatory Visit: Payer: 59 | Admitting: Dermatology

## 2023-06-11 ENCOUNTER — Ambulatory Visit (INDEPENDENT_AMBULATORY_CARE_PROVIDER_SITE_OTHER): Payer: 59 | Admitting: Family Medicine

## 2023-06-11 ENCOUNTER — Encounter: Payer: Self-pay | Admitting: Dermatology

## 2023-06-11 ENCOUNTER — Other Ambulatory Visit (HOSPITAL_BASED_OUTPATIENT_CLINIC_OR_DEPARTMENT_OTHER): Payer: Self-pay

## 2023-06-11 VITALS — BP 120/80 | HR 65

## 2023-06-11 VITALS — BP 148/82 | HR 70 | Ht 66.5 in | Wt 195.2 lb

## 2023-06-11 DIAGNOSIS — F413 Other mixed anxiety disorders: Secondary | ICD-10-CM | POA: Diagnosis not present

## 2023-06-11 DIAGNOSIS — F5102 Adjustment insomnia: Secondary | ICD-10-CM

## 2023-06-11 DIAGNOSIS — R7303 Prediabetes: Secondary | ICD-10-CM | POA: Diagnosis not present

## 2023-06-11 DIAGNOSIS — L304 Erythema intertrigo: Secondary | ICD-10-CM

## 2023-06-11 DIAGNOSIS — D2310 Other benign neoplasm of skin of unspecified eyelid, including canthus: Secondary | ICD-10-CM

## 2023-06-11 DIAGNOSIS — R221 Localized swelling, mass and lump, neck: Secondary | ICD-10-CM

## 2023-06-11 DIAGNOSIS — D239 Other benign neoplasm of skin, unspecified: Secondary | ICD-10-CM | POA: Diagnosis not present

## 2023-06-11 DIAGNOSIS — R220 Localized swelling, mass and lump, head: Secondary | ICD-10-CM | POA: Diagnosis not present

## 2023-06-11 DIAGNOSIS — Z0000 Encounter for general adult medical examination without abnormal findings: Secondary | ICD-10-CM

## 2023-06-11 DIAGNOSIS — F339 Major depressive disorder, recurrent, unspecified: Secondary | ICD-10-CM

## 2023-06-11 DIAGNOSIS — L280 Lichen simplex chronicus: Secondary | ICD-10-CM | POA: Diagnosis not present

## 2023-06-11 DIAGNOSIS — D23112 Other benign neoplasm of skin of right lower eyelid, including canthus: Secondary | ICD-10-CM

## 2023-06-11 MED ORDER — ALPRAZOLAM 0.5 MG PO TABS
0.2500 mg | ORAL_TABLET | Freq: Every evening | ORAL | 2 refills | Status: AC | PRN
Start: 2023-06-11 — End: ?
  Filled 2023-06-11: qty 20, 20d supply, fill #0
  Filled 2023-10-28: qty 20, 20d supply, fill #1

## 2023-06-11 MED ORDER — KETOCONAZOLE 2 % EX CREA
1.0000 | TOPICAL_CREAM | Freq: Two times a day (BID) | CUTANEOUS | 5 refills | Status: AC
  Filled 2023-06-11: qty 30, 15d supply, fill #0
  Filled 2023-08-19: qty 60, 30d supply, fill #1
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
## 2023-06-11 NOTE — Progress Notes (Signed)
 Follow-Up Visit   Subjective  Caitlyn Richardson is a 43 y.o. female who presents for the following: Acne and LSC  Patient present today for follow up visit for Acne and LSC. Patient was last evaluated on 03/11/23. At this visit pt was instructed to continue use of Tacrolimus  Ointment daily along with Melaxemic Cream. Patient reports sxs are unchanged. Patient stated her daughter was hospitalized in November so she got off track with her schedule with applying the medications. She hasn't applied the topicals since November. Patient denies medication changes.  Patient also has a spot that comes and goes underneath her right breast. She states the spot has come and gone for about a year and the most recent flare has been there several months. On itch scale she rates the area 8 out of 10. She has since changed her detergents to ALL free and clear and she bathes in Olay body wash.  The following portions of the chart were reviewed this encounter and updated as appropriate: medications, allergies, medical history  Review of Systems:  No other skin or systemic complaints except as noted in HPI or Assessment and Plan.  Objective  Well appearing patient in no apparent distress; mood and affect are within normal limits.  A focused examination was performed of the following areas: Face and   Relevant exam findings are noted in the Assessment and Plan.              Assessment & Plan   LICHEN SIMPLEX CHRONICUS/PRURIGO NODULARIS Exam: Excoriated lichenified papules and/or plaques at face  Improved but not at goal  Lichen simplex chronicus [facility]) is a persistent itchy area of thickened skin that is induced by chronic rubbing and/or scratching (chronic dermatitis).  These areas may be pink, hyperpigmented and may have excoriations and bumps (prurigo nodules- PN).  LSC/PN is commonly observed in uncontrolled atopic dermatitis and other forms of eczema, and in other itchy skin conditions (eg, insect bites,  scabies).  Sometimes it is not possible to know initial cause of LSC/PN if it has been present for a long time.  It generally responds well to treatment with high potency topical steroids.  It is important to stop rubbing/scratching the area in order to break the itch-scratch-rash-itch cycle, in order for the rash to resolve.   Treatment Plan: - Recommended taking a daily antihistamine and Nasal Antihistamine to help aid in prevent of Allergies that may be contributing to the excessive itching - Recommended continuing Melaxmic Cream nightly - Recommended continuing Tacrolimus  to apply daily the the effected areas on face - Recommended ROC Retinol for under eye darkness - Recommended washing with LRP or Cerave Hydrating Cleanser and following up with LRP Tolerine with UV facial moisturizer  - Avoid picking/rubbing/scratching  Syringoma   Exam: skin colored flat topped papules  Flared  Treatment Plan: - Quoted $300 to cosmetically treat the right lower eyelid with Electrodesiccation, advised to schedule    INTERTRIGO Exam: Macerated patches in body folds  Flared  Intertrigo is a chronic recurrent rash that occurs in skin fold areas that may be associated with friction; heat; moisture; yeast; fungus; and bacteria.  It is exacerbated by increased movement / activity; sweating; and higher atmospheric temperature.  Use of an absorbant powder such as Zeasorb AF powder or other OTC antifungal powder to the area daily can prevent rash recurrence. Other options to help keep the area dry include blow drying the area after bathing or using antiperspirant products such as Duradry sweat  minimizing gel.  Treatment Plan: -  We will plan to prescribe Ketoconazole  cream to apply to the effected areas until cleared  DERMATOFIBROMA Exam: Firm pink/brown papulenodule with dimple sign  Treatment Plan: A dermatofibroma is a benign growth possibly related to trauma, such as an insect bite, cut from shaving,  or inflamed acne-type bump.  Treatment options to remove include shave or excision with resulting scar and risk of recurrence.  Since benign-appearing and not bothersome, will observe for now.     Return in about 6 months (around 12/09/2023) for [facility], Syringoma & Intertrigo F/U.  Documentation: I have reviewed the above documentation for accuracy and completeness, and I agree with the above.  I, Jetta Ager, am acting as scribe for Delon Lenis, DO.  Delon Lenis, DO

## 2023-06-11 NOTE — Patient Instructions (Addendum)
 Hello Caitlyn Richardson,  Thank you for visiting us  today. We appreciate your dedication to enhancing your skin health. Below is a summary of the essential instructions from today's consultation:  Medications and Usage:   Melasma Cream: Continue using the melasma cream with tretinoin every other night during winter. If you experience dryness, adjust to every third night.   Tacrolimus : Apply as discussed, utilizing a Q-tip for precise application.   Roc Retinol Eye Cream: Consider for the eyelid areas, available over the counter.   Ketoconazole  Cream: For intertrigo, apply morning and night until it clears.  Allergy Management:   Antihistamines: Take daily as prescribed.   Nasal Care: Consider using Flonase spray or performing nasal rinses regularly to manage seasonal allergies.  Skin Care Routine:   Morning: Use a hydrating cleanser followed by moisturizer with sunscreen. Samples of La Roche-Posay Toleriane Moisturizer with UV Protection have been provided.    Night: Wash your face, apply melasma cream first, then tacrolimus , and moisturizer if needed. Switch to a foaming gel cleanser in summer.  Cosmetic Treatments:   Syringomas: Discuss potential treatment options, including gentle zapping with a possible need for a follow-up session.  Follow-Up:   Schedule your next appointment for late July or early August to evaluate progress and discuss potential additions to your treatment, such as salicylic acid peels.    Samples of La Roche-Posay's hydrating cleanser and moisturizer have been provided.   A Korean sunscreen brand, available on [employer], has been recommended.  Please adhere to these instructions carefully and reach out if you have any questions or concerns. We are looking forward to your next visit.  Best regards,  Dr. Delon Lenis,  Dermatology      Recommended Sunscreen: Misty He Sunscreen (Dark Blue Label)    Important Information   Due to recent changes in healthcare  laws, you may see results of your pathology and/or laboratory studies on MyChart before the doctors have had a chance to review them. We understand that in some cases there may be results that are confusing or concerning to you. Please understand that not all results are received at the same time and often the doctors may need to interpret multiple results in order to provide you with the best plan of care or course of treatment. Therefore, we ask that you please give us  2 business days to thoroughly review all your results before contacting the office for clarification. Should we see a critical lab result, you will be contacted sooner.     If You Need Anything After Your Visit   If you have any questions or concerns for your doctor, please call our main line at [phone]. If no one answers, please leave a voicemail as directed and we will return your call as soon as possible. Messages left after 4 pm will be answered the following business day.    You may also send us  a message via MyChart. We typically respond to MyChart messages within 1-2 business days.  For prescription refills, please ask your pharmacy to contact our office. Our fax number is [phone].  If you have an urgent issue when the clinic is closed that cannot wait until the next business day, you can page your doctor at the number below.     Please note that while we do our best to be available for urgent issues outside of office hours, we are not available 24/7.    If you have an urgent issue and are unable to reach us , you  may choose to seek medical care at your doctor's office, retail clinic, urgent care center, or emergency room.   If you have a medical emergency, please immediately call 911 or go to the emergency department. In the event of inclement weather, please call our main line at [phone] for an update on the status of any delays or closures.  Dermatology Medication Tips: Please keep the boxes that topical  medications come in in order to help keep track of the instructions about where and how to use these. Pharmacies typically print the medication instructions only on the boxes and not directly on the medication tubes.   If your medication is too expensive, please contact our office at [phone] or send us  a message through MyChart.    We are unable to tell what your co-pay for medications will be in advance as this is different depending on your insurance coverage. However, we may be able to find a substitute medication at lower cost or fill out paperwork to get insurance to cover a needed medication.    If a prior authorization is required to get your medication covered by your insurance company, please allow us  1-2 business days to complete this process.   Drug prices often vary depending on where the prescription is filled and some pharmacies may offer cheaper prices.   The website www.goodrx.com contains coupons for medications through different pharmacies. The prices here do not account for what the cost may be with help from insurance (it may be cheaper with your insurance), but the website can give you the price if you did not use any insurance.  - You can print the associated coupon and take it with your prescription to the pharmacy.  - You may also stop by our office during regular business hours and pick up a GoodRx coupon card.  - If you need your prescription sent electronically to a different pharmacy, notify our office through [facility] or by phone at [phone]

## 2023-06-11 NOTE — Assessment & Plan Note (Signed)
 Noted on recent labs, patient has been making lifestyle modifications since noting elevation in A1c.  Discussed recommendations related to lifestyle modifications, also provided handout today outlining these recommendations Will plan for follow-up in about 6 months to monitor progress and plan to recheck hemoglobin A1c around that time

## 2023-06-11 NOTE — Progress Notes (Deleted)
   Follow-Up Visit   Subjective  Fareedah Mahler is a 43 y.o. female who presents for the following: Acne  Patient present today for follow up visit for Acne. Patient was last evaluated on ***. Patient reports sxs are {DESC; BETTER/WORSE:18575}. Patient {Actions; denies-reports:120008} medication changes.  The following portions of the chart were reviewed this encounter and updated as appropriate: medications, allergies, medical history  Review of Systems:  No other skin or systemic complaints except as noted in HPI or Assessment and Plan.  Objective  Well appearing patient in no apparent distress; mood and affect are within normal limits.  ***A full examination was performed including scalp, head, eyes, ears, nose, lips, neck, chest, axillae, abdomen, back, buttocks, bilateral upper extremities, bilateral lower extremities, hands, feet, fingers, toes, fingernails, and toenails. All findings within normal limits unless otherwise noted below.   ***A focused examination was performed of the following areas: ***  Relevant exam findings are noted in the Assessment and Plan.    Assessment & Plan       No follow-ups on file.  ***  Documentation: I have reviewed the above documentation for accuracy and completeness, and I agree with the above.  Delon Lenis, DO

## 2023-06-11 NOTE — Patient Instructions (Signed)

## 2023-06-11 NOTE — Assessment & Plan Note (Signed)
 Had initial ultrasound imaging in July 2023 with observation of lymph nodes which were most likely etiology of observed swelling.  She does continue to have swelling in the area and thinks that swelling has possibly increased since last assessment. Can proceed with repeat ultrasound for further evaluation.  Pending results of ultrasound, could also consider further evaluation with ENT

## 2023-06-11 NOTE — Assessment & Plan Note (Signed)
 Routine HCM labs reviewed. HCM reviewed/discussed. Anticipatory guidance regarding healthy weight, lifestyle and choices given. Recommend healthy diet.  Recommend approximately 150 minutes/week of moderate intensity exercise Recommend regular dental and vision exams Always use seatbelt/lap and shoulder restraints Recommend using smoke alarms and checking batteries at least twice a year Recommend using sunscreen when outside Discussed tetanus immunization recommendations, patient is UTD

## 2023-06-15 DIAGNOSIS — Z20822 Contact with and (suspected) exposure to covid-19: Secondary | ICD-10-CM | POA: Diagnosis not present

## 2023-06-15 DIAGNOSIS — J101 Influenza due to other identified influenza virus with other respiratory manifestations: Secondary | ICD-10-CM | POA: Diagnosis not present

## 2023-06-15 DIAGNOSIS — M791 Myalgia, unspecified site: Secondary | ICD-10-CM | POA: Diagnosis not present

## 2023-06-16 ENCOUNTER — Other Ambulatory Visit: Payer: 59

## 2023-06-18 ENCOUNTER — Ambulatory Visit: Payer: 59 | Admitting: Licensed Clinical Social Worker

## 2023-06-22 ENCOUNTER — Telehealth (HOSPITAL_BASED_OUTPATIENT_CLINIC_OR_DEPARTMENT_OTHER): Payer: Self-pay | Admitting: Family Medicine

## 2023-06-22 NOTE — Telephone Encounter (Signed)
 Pt came in to ask about fmla paperwork being filled out and what she has to do for it. She was seen previously at urgent care for the flu and was told to get forms filled out. please contact pt

## 2023-06-22 NOTE — Telephone Encounter (Signed)
 If she was seen at urgent care for this they would be the ones to fill this out typically fmla is not filled out when someone has the flu. Please let the patient know

## 2023-06-26 ENCOUNTER — Other Ambulatory Visit: Payer: 59

## 2023-06-30 ENCOUNTER — Ambulatory Visit
Admission: RE | Admit: 2023-06-30 | Discharge: 2023-06-30 | Disposition: A | Discharge status: Home / Self Care | Payer: 59 | Source: Ambulatory Visit | Attending: Family Medicine

## 2023-06-30 DIAGNOSIS — R220 Localized swelling, mass and lump, head: Secondary | ICD-10-CM

## 2023-06-30 DIAGNOSIS — R590 Localized enlarged lymph nodes: Secondary | ICD-10-CM | POA: Diagnosis not present

## 2023-07-08 ENCOUNTER — Ambulatory Visit: Payer: 59 | Admitting: Licensed Clinical Social Worker

## 2023-07-08 ENCOUNTER — Encounter (HOSPITAL_BASED_OUTPATIENT_CLINIC_OR_DEPARTMENT_OTHER): Payer: Self-pay | Admitting: Family Medicine

## 2023-07-08 ENCOUNTER — Other Ambulatory Visit (HOSPITAL_BASED_OUTPATIENT_CLINIC_OR_DEPARTMENT_OTHER): Payer: Self-pay | Admitting: Family Medicine

## 2023-07-08 DIAGNOSIS — F339 Major depressive disorder, recurrent, unspecified: Secondary | ICD-10-CM

## 2023-07-08 DIAGNOSIS — F413 Other mixed anxiety disorders: Secondary | ICD-10-CM

## 2023-07-08 DIAGNOSIS — F5102 Adjustment insomnia: Secondary | ICD-10-CM

## 2023-07-09 ENCOUNTER — Other Ambulatory Visit (HOSPITAL_BASED_OUTPATIENT_CLINIC_OR_DEPARTMENT_OTHER): Payer: Self-pay

## 2023-07-09 ENCOUNTER — Ambulatory Visit: Admitting: Licensed Clinical Social Worker

## 2023-07-09 DIAGNOSIS — F339 Major depressive disorder, recurrent, unspecified: Secondary | ICD-10-CM

## 2023-07-09 DIAGNOSIS — F411 Generalized anxiety disorder: Secondary | ICD-10-CM | POA: Diagnosis not present

## 2023-07-09 MED ORDER — VENLAFAXINE HCL 75 MG PO TABS
75.0000 mg | ORAL_TABLET | Freq: Every day | ORAL | 1 refills | Status: DC
  Filled 2023-07-09: qty 90, 90d supply, fill #0
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
## 2023-07-14 ENCOUNTER — Ambulatory Visit: Payer: 59 | Admitting: Licensed Clinical Social Worker

## 2023-07-15 ENCOUNTER — Other Ambulatory Visit (HOSPITAL_BASED_OUTPATIENT_CLINIC_OR_DEPARTMENT_OTHER): Payer: Self-pay

## 2023-07-15 ENCOUNTER — Ambulatory Visit: Admitting: Licensed Clinical Social Worker

## 2023-07-17 ENCOUNTER — Ambulatory Visit: Admitting: Licensed Clinical Social Worker

## 2023-07-27 ENCOUNTER — Encounter (HOSPITAL_BASED_OUTPATIENT_CLINIC_OR_DEPARTMENT_OTHER): Payer: Self-pay | Admitting: *Deleted

## 2023-08-01 ENCOUNTER — Other Ambulatory Visit (HOSPITAL_BASED_OUTPATIENT_CLINIC_OR_DEPARTMENT_OTHER): Payer: Self-pay

## 2023-08-01 ENCOUNTER — Other Ambulatory Visit (HOSPITAL_BASED_OUTPATIENT_CLINIC_OR_DEPARTMENT_OTHER): Payer: Self-pay | Admitting: Family Medicine

## 2023-08-01 DIAGNOSIS — E559 Vitamin D deficiency, unspecified: Secondary | ICD-10-CM

## 2023-08-03 ENCOUNTER — Other Ambulatory Visit (HOSPITAL_BASED_OUTPATIENT_CLINIC_OR_DEPARTMENT_OTHER): Payer: Self-pay

## 2023-08-03 MED ORDER — VITAMIN D3 25 MCG (1000 UNIT) PO TABS
1000.0000 [IU] | ORAL_TABLET | Freq: Every day | ORAL | 1 refills | Status: AC
  Filled 2023-08-03: qty 100, 100d supply, fill #0

## 2023-08-12 ENCOUNTER — Other Ambulatory Visit (HOSPITAL_BASED_OUTPATIENT_CLINIC_OR_DEPARTMENT_OTHER): Payer: Self-pay

## 2023-08-19 ENCOUNTER — Other Ambulatory Visit (HOSPITAL_BASED_OUTPATIENT_CLINIC_OR_DEPARTMENT_OTHER): Payer: Self-pay

## 2023-08-24 ENCOUNTER — Ambulatory Visit (HOSPITAL_COMMUNITY): Admitting: Family

## 2023-09-03 DIAGNOSIS — M79641 Pain in right hand: Secondary | ICD-10-CM | POA: Diagnosis not present

## 2023-09-07 ENCOUNTER — Other Ambulatory Visit (HOSPITAL_BASED_OUTPATIENT_CLINIC_OR_DEPARTMENT_OTHER): Payer: Self-pay

## 2023-09-07 ENCOUNTER — Ambulatory Visit (HOSPITAL_BASED_OUTPATIENT_CLINIC_OR_DEPARTMENT_OTHER): Admitting: Family

## 2023-09-07 ENCOUNTER — Encounter (HOSPITAL_COMMUNITY): Payer: Self-pay | Admitting: Family

## 2023-09-07 ENCOUNTER — Other Ambulatory Visit: Payer: Self-pay

## 2023-09-07 DIAGNOSIS — F339 Major depressive disorder, recurrent, unspecified: Secondary | ICD-10-CM

## 2023-09-07 MED ORDER — HYDROXYZINE PAMOATE 25 MG PO CAPS
25.0000 mg | ORAL_CAPSULE | Freq: Every evening | ORAL | 0 refills | Status: AC | PRN
  Filled 2023-09-07: qty 60, 30d supply, fill #0

## 2023-09-07 MED ORDER — VENLAFAXINE HCL 37.5 MG PO TABS: 37.5000 mg | ORAL_TABLET | Freq: Every day | ORAL | Status: DC
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
## 2023-09-08 ENCOUNTER — Encounter (HOSPITAL_BASED_OUTPATIENT_CLINIC_OR_DEPARTMENT_OTHER): Payer: Self-pay | Admitting: *Deleted

## 2023-09-09 ENCOUNTER — Encounter (HOSPITAL_BASED_OUTPATIENT_CLINIC_OR_DEPARTMENT_OTHER): Payer: Self-pay | Admitting: Family Medicine

## 2023-09-09 ENCOUNTER — Ambulatory Visit (INDEPENDENT_AMBULATORY_CARE_PROVIDER_SITE_OTHER): Admitting: Family Medicine

## 2023-09-09 VITALS — BP 124/81 | HR 70 | Temp 98.7°F | Resp 16 | Ht 66.5 in | Wt 199.0 lb

## 2023-09-09 DIAGNOSIS — Z01818 Encounter for other preprocedural examination: Secondary | ICD-10-CM | POA: Diagnosis not present

## 2023-09-09 NOTE — Progress Notes (Signed)
    Procedures performed today:    None.  Independent interpretation of notes and tests performed by another provider:   None.  Brief History, Exam, Impression, and Recommendations:    Caitlyn Richardson is a 43 y.o. presenting for preoperative evaluation/clearance. The planned procedure is right wrist arthroscopy and joint debridement and repair as indicated, right hand carpal tunnel release with the treatment goal of reduced pain and improved function. Cardiac risk for planned procedure is Intermediate (1 to 5%) - intraperitoneal or intrathoracic surgery, carotid endarterectomy, head and neck surgery, orthopedic surgery, prostate surgery  Signs or symptoms of cardiovascular disease? No New or unstable cardiopulmonary signs or symptoms? No Urinary symptoms or invasive urologic procedure? No  BP 124/81   Pulse 70   Temp 98.7 F (37.1 C) (Oral)   Resp 16   Ht 5' 6.5" (1.689 m)   Wt 199 lb (90.3 kg)   LMP 09/22/2021 (Approximate)   SpO2 99%   BMI 31.64 kg/m   Exam: 43 year old female in no acute distress, vital signs stable Cardiovascular exam with regular rate and rhythm, no murmur appreciated Lungs clear to auscultation bilaterally  Preoperative clearance Based on history and exam will order the following preoperative tests: NONE - did review most recent labs which were completed earlier this year and were reassuring.  Patient without any current cardiopulmonary symptoms and exam normal in office today. Patient is medically cleared to proceed with planned surgery. Preoperative form for orthopedic surgeon completed in office today and faxed.   ___________________________________________ Desarai Barrack de [place], MD, ABFM, CAQSM Primary Care and Sports Medicine [facility]

## 2023-09-09 NOTE — Assessment & Plan Note (Addendum)
 Based on history and exam will order the following preoperative tests: NONE - did review most recent labs which were completed earlier this year and were reassuring.  Patient without any current cardiopulmonary symptoms and exam normal in office today. Patient is medically cleared to proceed with planned surgery. Preoperative form for orthopedic surgeon completed in office today and faxed.

## 2023-09-16 ENCOUNTER — Encounter: Payer: Self-pay | Admitting: Dermatology

## 2023-09-16 NOTE — Telephone Encounter (Signed)
 I can't recommend a product wihtout knowing the ingredients.  She could try it at her own risk or bring it with her to her next appointment.  I will build on her current regimen in June when it's time for the 4 month follow up.  She should definitely avoid SA before her next visit.

## 2023-09-22 ENCOUNTER — Other Ambulatory Visit: Payer: Self-pay | Admitting: General Surgery

## 2023-09-22 DIAGNOSIS — D241 Benign neoplasm of right breast: Secondary | ICD-10-CM | POA: Diagnosis not present

## 2023-09-24 ENCOUNTER — Other Ambulatory Visit: Payer: Self-pay | Admitting: General Surgery

## 2023-09-24 ENCOUNTER — Encounter

## 2023-09-24 DIAGNOSIS — D241 Benign neoplasm of right breast: Secondary | ICD-10-CM

## 2023-09-30 ENCOUNTER — Other Ambulatory Visit: Payer: Self-pay

## 2023-09-30 ENCOUNTER — Encounter (HOSPITAL_COMMUNITY): Payer: Self-pay | Admitting: Family

## 2023-09-30 ENCOUNTER — Ambulatory Visit (HOSPITAL_BASED_OUTPATIENT_CLINIC_OR_DEPARTMENT_OTHER): Admitting: Family

## 2023-09-30 VITALS — BP 134/81 | HR 69 | Ht 66.5 in | Wt 200.0 lb

## 2023-09-30 DIAGNOSIS — F411 Generalized anxiety disorder: Secondary | ICD-10-CM | POA: Diagnosis not present
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
## 2023-10-05 ENCOUNTER — Other Ambulatory Visit (HOSPITAL_COMMUNITY): Payer: Self-pay

## 2023-10-05 ENCOUNTER — Other Ambulatory Visit (HOSPITAL_BASED_OUTPATIENT_CLINIC_OR_DEPARTMENT_OTHER): Payer: Self-pay

## 2023-10-05 DIAGNOSIS — S63591A Other specified sprain of right wrist, initial encounter: Secondary | ICD-10-CM | POA: Diagnosis not present

## 2023-10-05 DIAGNOSIS — G5601 Carpal tunnel syndrome, right upper limb: Secondary | ICD-10-CM | POA: Diagnosis not present

## 2023-10-05 DIAGNOSIS — M24131 Other articular cartilage disorders, right wrist: Secondary | ICD-10-CM | POA: Diagnosis not present

## 2023-10-05 MED ORDER — HYDROCODONE-ACETAMINOPHEN 5-325 MG PO TABS
1.0000 | ORAL_TABLET | ORAL | 0 refills | Status: DC
  Filled 2023-10-05 (×2): qty 30, 5d supply, fill #0

## 2023-10-07 ENCOUNTER — Other Ambulatory Visit (HOSPITAL_COMMUNITY): Payer: Self-pay

## 2023-10-07 ENCOUNTER — Ambulatory Visit (INDEPENDENT_AMBULATORY_CARE_PROVIDER_SITE_OTHER): Admitting: Dermatology

## 2023-10-07 ENCOUNTER — Encounter: Payer: Self-pay | Admitting: Dermatology

## 2023-10-07 ENCOUNTER — Other Ambulatory Visit (HOSPITAL_BASED_OUTPATIENT_CLINIC_OR_DEPARTMENT_OTHER): Payer: Self-pay

## 2023-10-07 DIAGNOSIS — D2339 Other benign neoplasm of skin of other parts of face: Secondary | ICD-10-CM

## 2023-10-07 DIAGNOSIS — D2310 Other benign neoplasm of skin of unspecified eyelid, including canthus: Secondary | ICD-10-CM

## 2023-10-07 MED ORDER — OXYCODONE-ACETAMINOPHEN 5-325 MG PO TABS
1.0000 | ORAL_TABLET | ORAL | 0 refills | Status: DC | PRN
  Filled 2023-10-07 (×2): qty 25, 5d supply, fill #0

## 2023-10-07 NOTE — Patient Instructions (Signed)
 Patient Handout: Syringoma Removal   Proper care of the site is essential for promoting healing and minimizing scarring. This handout provides instructions on how to care for your site(s) to ensure optimal recovery.  1. Cleaning the Area: Clean the removal sites daily with gentle soap and water. Gently pat the area dry with a clean, soft towel. Avoid harsh scrubbing or rubbing the area, as this can irritate the skin and delay healing.  2. Applying Aquaphor: After cleaning the area, apply a thin layer of Aquaphor ointment to the sites.   3. Continued Care for Two Week: Repeat the cleaning, Aquaphor application Keeping the area clean and moist during this initial healing period will help prevent infection and promote optimal healing.    Important Information  Due to recent changes in healthcare laws, you may see results of your pathology and/or laboratory studies on MyChart before the doctors have had a chance to review them. We understand that in some cases there may be results that are confusing or concerning to you. Please understand that not all results are received at the same time and often the doctors may need to interpret multiple results in order to provide you with the best plan of care or course of treatment. Therefore, we ask that you please give us  2 business days to thoroughly review all your results before contacting the office for clarification. Should we see a critical lab result, you will be contacted sooner.   If You Need Anything After Your Visit  If you have any questions or concerns for your doctor, please call our main line at [phone] If no one answers, please leave a voicemail as directed and we will return your call as soon as possible. Messages left after 4 pm will be answered the following business day.   You may also send us  a message via MyChart. We typically respond to MyChart messages within 1-2 business days.  For prescription refills, please ask your  pharmacy to contact our office. Our fax number is [phone].  If you have an urgent issue when the clinic is closed that cannot wait until the next business day, you can page your doctor at the number below.    Please note that while we do our best to be available for urgent issues outside of office hours, we are not available 24/7.   If you have an urgent issue and are unable to reach us , you may choose to seek medical care at your doctor's office, retail clinic, urgent care center, or emergency room.  If you have a medical emergency, please immediately call 911 or go to the emergency department. In the event of inclement weather, please call our main line at [phone] for an update on the status of any delays or closures.  Dermatology Medication Tips: Please keep the boxes that topical medications come in in order to help keep track of the instructions about where and how to use these. Pharmacies typically print the medication instructions only on the boxes and not directly on the medication tubes.   If your medication is too expensive, please contact our office at [phone] or send us  a message through MyChart.   We are unable to tell what your co-pay for medications will be in advance as this is different depending on your insurance coverage. However, we may be able to find a substitute medication at lower cost or fill out paperwork to get insurance to cover a needed medication.   If a prior authorization is  required to get your medication covered by your insurance company, please allow us  1-2 business days to complete this process.  Drug prices often vary depending on where the prescription is filled and some pharmacies may offer cheaper prices.  The website www.goodrx.com contains coupons for medications through different pharmacies. The prices here do not account for what the cost may be with help from insurance (it may be cheaper with your insurance), but the website can give  you the price if you did not use any insurance.  - You can print the associated coupon and take it with your prescription to the pharmacy.  - You may also stop by our office during regular business hours and pick up a GoodRx coupon card.  - If you need your prescription sent electronically to a different pharmacy, notify our office through [facility] or by phone at [phone]

## 2023-10-07 NOTE — Progress Notes (Unsigned)
   Follow-Up Visit   Subjective  Caitlyn Richardson is a 43 y.o. female who presents for the following: Syringoma Removal  Patient present today for follow up visit. Patient was last evaluated on 06/11/23. At this visit patient was quoted $300 to treat R lower eyelid with electrodesiccation. Patient has acknowledged and signed non-covered procedure form. Patient denies medication changes.  The following portions of the chart were reviewed this encounter and updated as appropriate: medications, allergies, medical history  Review of Systems:  No other skin or systemic complaints except as noted in HPI or Assessment and Plan.  Objective  Well appearing patient in no apparent distress; mood and affect are within normal limits.   A focused examination was performed of the following areas: face   Relevant exam findings are noted in the Assessment and Plan.          Assessment & Plan   Periocular syringomas - Assessment: Caitlyn Richardson presents with syringomas around the eye area requiring cosmetic treatment. Syringomas are known to have a high recurrence rate, potentially necessitating multiple treatments.  - Plan:    Perform electrodesiccation procedure after applying topical anesthesia    Post-procedure care:     - Apply cold compress and Vaseline daily for 2-3 weeks     - After 3 weeks, restart Rock Retinol Eye Cream nightly    If fully healed after 2 weeks, start Roc Retinol Eye Cream    If still tender after 2 weeks, continue Aquaphor for an additional week    Start retinol with small amount every other night once healed    Use mineral sunscreen daily    Patient instructed to send message on MyChart for any questions  Destruction Procedure Note Destruction method: electrodesiccation  Informed consent: discussed and consent obtained   Lesion destroyed using hyfrecator: Yes   Outcome: patient tolerated procedure well with no complications   Post-procedure details: wound care instructions  given   Locations: right periocular skin # of Lesions Treated: 30+  Prior to procedure, discussed risks of scab formation, small wound, skin dyspigmentation, or rare scar following electrodesciccation. Recommend Vaseline ointment or LaRochePosay Cicaplast Balm to treated areas while healing.     No follow-ups on file.    Documentation: I have reviewed the above documentation for accuracy and completeness, and I agree with the above.   I, Shirron Louanne Roussel, CMA, am acting as scribe for [employer], DO.   Louana Roup, DO

## 2023-10-09 ENCOUNTER — Telehealth (HOSPITAL_COMMUNITY): Admitting: Family

## 2023-10-09 DIAGNOSIS — F411 Generalized anxiety disorder: Secondary | ICD-10-CM

## 2023-10-09 DIAGNOSIS — F339 Major depressive disorder, recurrent, unspecified: Secondary | ICD-10-CM

## 2023-10-09 MED ORDER — DESVENLAFAXINE SUCCINATE ER 25 MG PO TB24: ORAL_TABLET | ORAL | 0 refills | Status: DC
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
## 2023-10-13 ENCOUNTER — Other Ambulatory Visit (HOSPITAL_BASED_OUTPATIENT_CLINIC_OR_DEPARTMENT_OTHER): Payer: Self-pay

## 2023-10-13 ENCOUNTER — Other Ambulatory Visit (HOSPITAL_COMMUNITY): Payer: Self-pay | Admitting: *Deleted

## 2023-10-13 MED ORDER — DESVENLAFAXINE SUCCINATE ER 25 MG PO TB24
ORAL_TABLET | ORAL | 0 refills | Status: DC
  Filled 2023-10-13: qty 60, 33d supply, fill #0

## 2023-10-13 MED ORDER — DESVENLAFAXINE SUCCINATE ER 25 MG PO TB24: ORAL_TABLET | ORAL | 0 refills | Status: DC

## 2023-10-14 ENCOUNTER — Other Ambulatory Visit (HOSPITAL_BASED_OUTPATIENT_CLINIC_OR_DEPARTMENT_OTHER): Payer: Self-pay

## 2023-10-20 ENCOUNTER — Encounter (HOSPITAL_BASED_OUTPATIENT_CLINIC_OR_DEPARTMENT_OTHER): Payer: Self-pay | Admitting: General Surgery

## 2023-10-20 ENCOUNTER — Other Ambulatory Visit: Payer: Self-pay

## 2023-10-20 ENCOUNTER — Encounter (HOSPITAL_BASED_OUTPATIENT_CLINIC_OR_DEPARTMENT_OTHER)
Admission: RE | Admit: 2023-10-20 | Discharge: 2023-10-20 | Disposition: A | Discharge status: Home / Self Care | Source: Ambulatory Visit | Attending: General Surgery | Admitting: General Surgery

## 2023-10-20 DIAGNOSIS — Z01812 Encounter for preprocedural laboratory examination: Secondary | ICD-10-CM | POA: Diagnosis not present

## 2023-10-20 DIAGNOSIS — S62354D Nondisplaced fracture of shaft of fourth metacarpal bone, right hand, subsequent encounter for fracture with routine healing: Secondary | ICD-10-CM | POA: Diagnosis not present

## 2023-10-20 DIAGNOSIS — Z79899 Other long term (current) drug therapy: Secondary | ICD-10-CM | POA: Diagnosis not present

## 2023-10-20 DIAGNOSIS — Z01818 Encounter for other preprocedural examination: Secondary | ICD-10-CM | POA: Diagnosis present

## 2023-10-20 LAB — BASIC METABOLIC PANEL WITH GFR
Anion gap: 11 (ref 5–15)
BUN: 12 mg/dL (ref 6–20)
CO2: 24 mmol/L (ref 22–32)
Calcium: 9.2 mg/dL (ref 8.9–10.3)
Chloride: 103 mmol/L (ref 98–111)
Creatinine, Ser: 0.74 mg/dL (ref 0.44–1.00)
GFR, Estimated: >60 mL/min (ref >60)
Glucose, Bld: 173 mg/dL — ABNORMAL HIGH (ref 70–99)
Potassium: 4.1 mmol/L (ref 3.5–5.1)
Sodium: 138 mmol/L (ref 135–145)

## 2023-10-20 MED ORDER — CHLORHEXIDINE GLUCONATE CLOTH 2 % EX PADS: 6.0000 | MEDICATED_PAD | Freq: Once | CUTANEOUS | Status: DC

## 2023-10-20 NOTE — Progress Notes (Signed)
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
## 2023-10-23 ENCOUNTER — Ambulatory Visit
Admission: RE | Admit: 2023-10-23 | Discharge: 2023-10-23 | Disposition: A | Discharge status: Home / Self Care | Source: Ambulatory Visit | Attending: General Surgery | Admitting: General Surgery

## 2023-10-23 ENCOUNTER — Other Ambulatory Visit: Payer: Self-pay | Admitting: General Surgery

## 2023-10-23 DIAGNOSIS — D241 Benign neoplasm of right breast: Secondary | ICD-10-CM

## 2023-10-26 NOTE — Anesthesia Preprocedure Evaluation (Signed)
 Anesthesia Evaluation  Patient identified by MRN, date of birth, ID band Patient awake    Reviewed: Allergy & Precautions, NPO status , Patient's Chart, lab work & pertinent test results  History of Anesthesia Complications Negative for: history of anesthetic complications  Airway Mallampati: III  TM Distance: >3 FB Neck ROM: Full   Comment: Previous grade I view with MAC 3, easy mask Dental  (+) Dental Advisory Given   Pulmonary neg shortness of breath, sleep apnea (does not use CPAP) , neg COPD, neg recent URI   Pulmonary exam normal breath sounds clear to auscultation       Cardiovascular hypertension (amlodipine -olmesartan , spironolactone ), Pt. on medications (-) angina (-) Past MI, (-) Cardiac Stents and (-) CABG (-) dysrhythmias  Rhythm:Regular Rate:Normal  HLD   Neuro/Psych  Headaches, neg Seizures PSYCHIATRIC DISORDERS Anxiety Depression     Neuromuscular disease (chronic left-sided low back pain with sciatica, lumbar radiculopathy)    GI/Hepatic negative GI ROS, Neg liver ROS,,,  Endo/Other  negative endocrine ROS    Renal/GU negative Renal ROS     Musculoskeletal  (+) Arthritis ,    Abdominal  (+) + obese  Peds  Hematology negative hematology ROS (+) Lab Results      Component                Value               Date                      WBC                      8.6                 06/05/2023                HGB                      13.0                06/05/2023                HCT                      38.4                06/05/2023                MCV                      90                  06/05/2023                PLT                      320                 06/05/2023              Anesthesia Other Findings   Reproductive/Obstetrics Right breast papilloma, H/o PID                             Anesthesia Physical Anesthesia Plan  ASA: 2  Anesthesia Plan: General   Post-op  Pain Management: Tylenol  PO (pre-op)*  Induction: Intravenous  PONV Risk Score and Plan: 3 and Ondansetron , Dexamethasone , Propofol  infusion, TIVA, Midazolam  and Treatment may vary due to age or medical condition  Airway Management Planned: LMA  Additional Equipment:   Intra-op Plan:   Post-operative Plan: Extubation in OR  Informed Consent: I have reviewed the patients History and Physical, chart, labs and discussed the procedure including the risks, benefits and alternatives for the proposed anesthesia with the patient or authorized representative who has indicated his/her understanding and acceptance.     Dental advisory given  Plan Discussed with: CRNA and Anesthesiologist  Anesthesia Plan Comments: (Risks of general anesthesia discussed including, but not limited to, sore throat, hoarse voice, chipped/damaged teeth, injury to vocal cords, nausea and vomiting, allergic reactions, lung infection, heart attack, stroke, and death. All questions answered. )        Anesthesia Quick Evaluation

## 2023-10-27 ENCOUNTER — Ambulatory Visit (HOSPITAL_BASED_OUTPATIENT_CLINIC_OR_DEPARTMENT_OTHER): Payer: Self-pay | Admitting: Certified Registered"

## 2023-10-27 ENCOUNTER — Encounter (HOSPITAL_BASED_OUTPATIENT_CLINIC_OR_DEPARTMENT_OTHER): Payer: Self-pay | Admitting: General Surgery

## 2023-10-27 ENCOUNTER — Ambulatory Visit
Admission: RE | Admit: 2023-10-27 | Discharge: 2023-10-27 | Disposition: A | Discharge status: Home / Self Care | Source: Ambulatory Visit | Attending: General Surgery | Admitting: General Surgery

## 2023-10-27 ENCOUNTER — Other Ambulatory Visit: Payer: Self-pay

## 2023-10-27 ENCOUNTER — Ambulatory Visit (HOSPITAL_BASED_OUTPATIENT_CLINIC_OR_DEPARTMENT_OTHER)
Admission: RE | Admit: 2023-10-27 | Discharge: 2023-10-27 | Disposition: A | Discharge status: Home / Self Care | Attending: General Surgery | Admitting: General Surgery

## 2023-10-27 ENCOUNTER — Encounter (HOSPITAL_BASED_OUTPATIENT_CLINIC_OR_DEPARTMENT_OTHER)
Admission: RE | Disposition: A | Discharge status: Home / Self Care | Payer: Self-pay | Source: Home / Self Care | Attending: General Surgery

## 2023-10-27 ENCOUNTER — Other Ambulatory Visit (HOSPITAL_BASED_OUTPATIENT_CLINIC_OR_DEPARTMENT_OTHER): Payer: Self-pay

## 2023-10-27 DIAGNOSIS — N6031 Fibrosclerosis of right breast: Secondary | ICD-10-CM | POA: Diagnosis not present

## 2023-10-27 DIAGNOSIS — I10 Essential (primary) hypertension: Secondary | ICD-10-CM | POA: Insufficient documentation

## 2023-10-27 DIAGNOSIS — N6041 Mammary duct ectasia of right breast: Secondary | ICD-10-CM | POA: Diagnosis not present

## 2023-10-27 DIAGNOSIS — E785 Hyperlipidemia, unspecified: Secondary | ICD-10-CM | POA: Insufficient documentation

## 2023-10-27 DIAGNOSIS — Z59811 Housing instability, housed, with risk of homelessness: Secondary | ICD-10-CM | POA: Insufficient documentation

## 2023-10-27 DIAGNOSIS — N6489 Other specified disorders of breast: Secondary | ICD-10-CM | POA: Diagnosis not present

## 2023-10-27 DIAGNOSIS — F32A Depression, unspecified: Secondary | ICD-10-CM | POA: Diagnosis not present

## 2023-10-27 DIAGNOSIS — D241 Benign neoplasm of right breast: Secondary | ICD-10-CM | POA: Diagnosis not present

## 2023-10-27 DIAGNOSIS — F419 Anxiety disorder, unspecified: Secondary | ICD-10-CM | POA: Insufficient documentation

## 2023-10-27 DIAGNOSIS — M5442 Lumbago with sciatica, left side: Secondary | ICD-10-CM | POA: Diagnosis not present

## 2023-10-27 DIAGNOSIS — Z79899 Other long term (current) drug therapy: Secondary | ICD-10-CM

## 2023-10-27 DIAGNOSIS — M5416 Radiculopathy, lumbar region: Secondary | ICD-10-CM | POA: Insufficient documentation

## 2023-10-27 DIAGNOSIS — N62 Hypertrophy of breast: Secondary | ICD-10-CM | POA: Diagnosis not present

## 2023-10-27 HISTORY — PX: BREAST LUMPECTOMY WITH RADIOACTIVE SEED LOCALIZATION: SHX6424

## 2023-10-27 HISTORY — DX: Sleep apnea, unspecified: G47.30

## 2023-10-27 SURGERY — RADIOACTIVE SEED GUIDED BREAST BIOPSY
Anesthesia: General | Site: Breast | Laterality: Right

## 2023-10-27 MED ORDER — LIDOCAINE HCL (PF) 1 % IJ SOLN
INTRAMUSCULAR | Status: AC
  Filled 2023-10-27: qty 60

## 2023-10-27 MED ORDER — FENTANYL CITRATE (PF) 100 MCG/2ML IJ SOLN
INTRAMUSCULAR | Status: DC | PRN
  Administered 2023-10-27: 25 ug via INTRAVENOUS
  Administered 2023-10-27: 50 ug via INTRAVENOUS
  Administered 2023-10-27: 25 ug via INTRAVENOUS

## 2023-10-27 MED ORDER — AMISULPRIDE (ANTIEMETIC) 5 MG/2ML IV SOLN: 10.0000 mg | Freq: Once | INTRAVENOUS | Status: DC | PRN

## 2023-10-27 MED ORDER — OXYCODONE HCL 5 MG PO TABS
ORAL_TABLET | ORAL | Status: AC
  Filled 2023-10-27: qty 1

## 2023-10-27 MED ORDER — OXYCODONE HCL 5 MG/5ML PO SOLN: 5.0000 mg | Freq: Once | ORAL | Status: AC | PRN

## 2023-10-27 MED ORDER — OXYCODONE HCL 5 MG PO TABS
5.0000 mg | ORAL_TABLET | Freq: Four times a day (QID) | ORAL | 0 refills | Status: DC | PRN
  Filled 2023-10-27: qty 15, 4d supply, fill #0

## 2023-10-27 MED ORDER — MIDAZOLAM HCL 2 MG/2ML IJ SOLN
INTRAMUSCULAR | Status: AC
  Filled 2023-10-27: qty 2

## 2023-10-27 MED ORDER — LIDOCAINE 2% (20 MG/ML) 5 ML SYRINGE
INTRAMUSCULAR | Status: DC | PRN
  Administered 2023-10-27: 100 mg via INTRAVENOUS

## 2023-10-27 MED ORDER — PROPOFOL 500 MG/50ML IV EMUL
INTRAVENOUS | Status: DC | PRN
  Administered 2023-10-27: 150 ug/kg/min via INTRAVENOUS

## 2023-10-27 MED ORDER — BUPIVACAINE-EPINEPHRINE (PF) 0.5% -1:200000 IJ SOLN
INTRAMUSCULAR | Status: AC
Start: 2023-10-27 — End: 2023-10-27
  Filled 2023-10-27: qty 60

## 2023-10-27 MED ORDER — MIDAZOLAM HCL 5 MG/5ML IJ SOLN
INTRAMUSCULAR | Status: DC | PRN
  Administered 2023-10-27: 2 mg via INTRAVENOUS

## 2023-10-27 MED ORDER — PROPOFOL 10 MG/ML IV BOLUS
INTRAVENOUS | Status: AC
Start: 2023-10-27 — End: 2023-10-27
  Filled 2023-10-27: qty 20

## 2023-10-27 MED ORDER — FENTANYL CITRATE (PF) 100 MCG/2ML IJ SOLN
INTRAMUSCULAR | Status: AC
  Filled 2023-10-27: qty 2

## 2023-10-27 MED ORDER — CEFAZOLIN SODIUM-DEXTROSE 2-4 GM/100ML-% IV SOLN
2.0000 g | INTRAVENOUS | Status: AC
  Administered 2023-10-27: 2 g via INTRAVENOUS

## 2023-10-27 MED ORDER — LACTATED RINGERS IV SOLN
INTRAVENOUS | Status: DC
Start: 2023-10-27 — End: 2023-10-27

## 2023-10-27 MED ORDER — OXYCODONE HCL 5 MG PO TABS
5.0000 mg | ORAL_TABLET | Freq: Once | ORAL | Status: AC | PRN
  Administered 2023-10-27: 5 mg via ORAL

## 2023-10-27 MED ORDER — ACETAMINOPHEN 500 MG PO TABS
1000.0000 mg | ORAL_TABLET | ORAL | Status: AC
  Administered 2023-10-27: 1000 mg via ORAL

## 2023-10-27 MED ORDER — LIDOCAINE 2% (20 MG/ML) 5 ML SYRINGE
INTRAMUSCULAR | Status: AC
  Filled 2023-10-27: qty 5

## 2023-10-27 MED ORDER — PROPOFOL 500 MG/50ML IV EMUL
INTRAVENOUS | Status: AC
Start: 2023-10-27 — End: 2023-10-27
  Filled 2023-10-27: qty 50

## 2023-10-27 MED ORDER — CEFAZOLIN SODIUM-DEXTROSE 2-4 GM/100ML-% IV SOLN
INTRAVENOUS | Status: AC
  Filled 2023-10-27: qty 100

## 2023-10-27 MED ORDER — BUPIVACAINE-EPINEPHRINE (PF) 0.5% -1:200000 IJ SOLN
INTRAMUSCULAR | Status: DC | PRN
  Administered 2023-10-27: 30 mL

## 2023-10-27 MED ORDER — FENTANYL CITRATE (PF) 100 MCG/2ML IJ SOLN
25.0000 ug | INTRAMUSCULAR | Status: DC | PRN
  Administered 2023-10-27 (×2): 50 ug via INTRAVENOUS

## 2023-10-27 MED ORDER — PROPOFOL 10 MG/ML IV BOLUS
INTRAVENOUS | Status: DC | PRN
  Administered 2023-10-27: 200 mg via INTRAVENOUS

## 2023-10-27 MED ORDER — DEXAMETHASONE SODIUM PHOSPHATE 10 MG/ML IJ SOLN
INTRAMUSCULAR | Status: AC
Start: 2023-10-27 — End: 2023-10-27
  Filled 2023-10-27: qty 1

## 2023-10-27 MED ORDER — FENTANYL CITRATE (PF) 100 MCG/2ML IJ SOLN
INTRAMUSCULAR | Status: AC
Start: 2023-10-27 — End: 2023-10-27
  Filled 2023-10-27: qty 2

## 2023-10-27 MED ORDER — ACETAMINOPHEN 500 MG PO TABS
ORAL_TABLET | ORAL | Status: AC
  Filled 2023-10-27: qty 2

## 2023-10-27 MED ORDER — DEXAMETHASONE SODIUM PHOSPHATE 4 MG/ML IJ SOLN
INTRAMUSCULAR | Status: DC | PRN
Start: 2023-10-27 — End: 2023-10-27
  Administered 2023-10-27: 5 mg via INTRAVENOUS

## 2023-10-27 MED ORDER — ONDANSETRON HCL 4 MG/2ML IJ SOLN
INTRAMUSCULAR | Status: DC | PRN
  Administered 2023-10-27: 4 mg via INTRAVENOUS

## 2023-10-27 MED ORDER — 0.9 % SODIUM CHLORIDE (POUR BTL) OPTIME
TOPICAL | Status: DC | PRN
  Administered 2023-10-27: 30 mL

## 2023-10-27 MED ORDER — ONDANSETRON HCL 4 MG/2ML IJ SOLN
INTRAMUSCULAR | Status: AC
  Filled 2023-10-27: qty 2

## 2023-10-27 SURGICAL SUPPLY — 47 items
BINDER BREAST XLRG (GAUZE/BANDAGES/DRESSINGS) IMPLANT
BLADE SURG 10 STRL SS (BLADE) ×1 IMPLANT
BLADE SURG 15 STRL LF DISP TIS (BLADE) IMPLANT
CANISTER SUC SOCK COL 7IN (MISCELLANEOUS) IMPLANT
CANISTER SUCT 1200ML W/VALVE (MISCELLANEOUS) ×1 IMPLANT
CHLORAPREP W/TINT 26 (MISCELLANEOUS) ×1 IMPLANT
CLIP TI LARGE 6 (CLIP) ×1 IMPLANT
COVER BACK TABLE 60X90IN (DRAPES) ×1 IMPLANT
COVER MAYO STAND STRL (DRAPES) ×1 IMPLANT
COVER PROBE CYLINDRICAL 5X96 (MISCELLANEOUS) ×1 IMPLANT
DERMABOND ADVANCED .7 DNX12 (GAUZE/BANDAGES/DRESSINGS) ×1 IMPLANT
DRAPE LAPAROSCOPIC ABDOMINAL (DRAPES) ×1 IMPLANT
DRAPE UTILITY XL STRL (DRAPES) ×1 IMPLANT
ELECT COATED BLADE 2.86 ST (ELECTRODE) ×1 IMPLANT
ELECTRODE REM PT RTRN 9FT ADLT (ELECTROSURGICAL) ×1 IMPLANT
GAUZE PAD ABD 8X10 STRL (GAUZE/BANDAGES/DRESSINGS) IMPLANT
GAUZE SPONGE 4X4 12PLY STRL LF (GAUZE/BANDAGES/DRESSINGS) ×1 IMPLANT
GLOVE BIO SURGEON STRL SZ 6 (GLOVE) ×1 IMPLANT
GLOVE BIOGEL PI IND STRL 6.5 (GLOVE) ×1 IMPLANT
GLOVE BIOGEL PI IND STRL 7.0 (GLOVE) IMPLANT
GLOVE BIOGEL PI IND STRL 7.5 (GLOVE) IMPLANT
GLOVE SURG SS PI 7.0 STRL IVOR (GLOVE) IMPLANT
GOWN STRL REUS W/ TWL LRG LVL3 (GOWN DISPOSABLE) ×1 IMPLANT
GOWN STRL REUS W/ TWL XL LVL3 (GOWN DISPOSABLE) ×1 IMPLANT
KIT MARKER MARGIN INK (KITS) ×1 IMPLANT
LIGHT WAVEGUIDE WIDE FLAT (MISCELLANEOUS) IMPLANT
NDL HYPO 25X1 1.5 SAFETY (NEEDLE) ×1 IMPLANT
NEEDLE HYPO 25X1 1.5 SAFETY (NEEDLE) ×1 IMPLANT
NS IRRIG 1000ML POUR BTL (IV SOLUTION) ×1 IMPLANT
PACK BASIN DAY SURGERY FS (CUSTOM PROCEDURE TRAY) ×1 IMPLANT
PENCIL SMOKE EVACUATOR (MISCELLANEOUS) ×1 IMPLANT
SLEEVE SCD COMPRESS KNEE MED (STOCKING) ×1 IMPLANT
SPIKE FLUID TRANSFER (MISCELLANEOUS) IMPLANT
SPONGE T-LAP 18X18 ~~LOC~~+RFID (SPONGE) ×1 IMPLANT
STAPLER SKIN PROX WIDE 3.9 (STAPLE) IMPLANT
STRIP CLOSURE SKIN 1/2X4 (GAUZE/BANDAGES/DRESSINGS) ×1 IMPLANT
SUT MON AB 4-0 PC3 18 (SUTURE) ×1 IMPLANT
SUT SILK 2 0 SH (SUTURE) IMPLANT
SUT VIC AB 2-0 SH 18 (SUTURE) IMPLANT
SUT VIC AB 3-0 SH 27X BRD (SUTURE) ×1 IMPLANT
SUT VICRYL 3-0 CR8 SH (SUTURE) IMPLANT
SYR BULB EAR ULCER 3OZ GRN STR (SYRINGE) ×1 IMPLANT
SYR CONTROL 10ML LL (SYRINGE) ×1 IMPLANT
TOWEL GREEN STERILE FF (TOWEL DISPOSABLE) ×1 IMPLANT
TRAY FAXITRON CT DISP (TRAY / TRAY PROCEDURE) ×1 IMPLANT
TUBE CONNECTING 20X1/4 (TUBING) ×1 IMPLANT
YANKAUER SUCT BULB TIP NO VENT (SUCTIONS) ×1 IMPLANT

## 2023-10-27 NOTE — Interval H&P Note (Signed)
 History and Physical Interval Note:  10/27/2023 8:58 AM  [name]  has presented today for surgery, with the diagnosis of RIGHT BREAST PAPILLOMA.  The various methods of treatment have been discussed with the patient and family. After consideration of risks, benefits and other options for treatment, the patient has consented to  Procedure(s) with comments: RIGHT BREAST RADIOACTIVE SEED LOCALIZED EXCISIONAL BIOPSY as a surgical intervention.  The patient's history has been reviewed, patient examined, no change in status, stable for surgery.  I have reviewed the patient's chart and labs.  Questions were answered to the patient's satisfaction.     Jina Nephew

## 2023-10-27 NOTE — Op Note (Signed)
 Right Breast Radioactive seed localized excisional biopsy  Indications: This patient presents with history of right nipple discharge and papilloma on needle biopsy.    Pre-operative Diagnosis: right breast papilloma  Post-operative Diagnosis: Same  Surgeon: ARON SHOULDERS   Anesthesia: General endotracheal anesthesia  ASA Class: 2  Procedure Details  The patient was seen in the Holding Room. The risks, benefits, complications, treatment options, and expected outcomes were discussed with the patient. The possibilities of bleeding, infection, the need for additional procedures, failure to diagnose a condition, and creating a complication requiring transfusion or operation were discussed with the patient. The patient concurred with the proposed plan, giving informed consent.  The site of surgery properly noted/marked. The patient was taken to Operating Room # 1, identified, and the procedure verified as right Breast seed localized excisional biopsy. A Time Out was held and the above information confirmed.  The right breast and chest were prepped and draped in standard fashion. A lateral circumareolar incision was made near the previously placed radioactive seed.  Dissection was carried down around the point of maximum signal intensity. The cautery was used to perform the dissection.   The specimen was inked with the margin marker paint kit.    Specimen radiography confirmed inclusion of the mammographic lesion, the clip, and the seed.  The background signal in the breast was zero.  One clip was placed in the breast cavity.  Hemostasis was achieved with cautery.  The wound was irrigated and closed with 3-0 vicryl interrupted deep dermal sutures and 4-0 monocryl running subcuticular suture.      Sterile dressings were applied. At the end of the operation, all sponge, instrument, and needle counts were correct.  Findings: Seed, clip in specimen.  anterior margin is lateral areola  Estimated Blood  Loss:  min         Specimens: right breast tissue with seed         Complications:  None; patient tolerated the procedure well.         Disposition: PACU - hemodynamically stable.         Condition: stable

## 2023-10-27 NOTE — Progress Notes (Signed)
 Last time received oxycodone  written on dc paperwork.

## 2023-10-27 NOTE — Discharge Instructions (Addendum)
 [facility] Office Phone Number [phone]  BREAST BIOPSY/ PARTIAL MASTECTOMY: POST OP INSTRUCTIONS  Always review your discharge instruction sheet given to you by the facility where your surgery was performed.  IF YOU HAVE DISABILITY OR FAMILY LEAVE FORMS, YOU MUST BRING THEM TO THE OFFICE FOR PROCESSING.  DO NOT GIVE THEM TO YOUR DOCTOR.  Take 2 tylenol  (acetominophen) three times a day for 3 days.  If you still have pain, add ibuprofen  with food in between if able to take this (if you have kidney issues or stomach issues, do not take ibuprofen ).  If both of those are not enough, add the narcotic pain pill.  If you find you are needing a lot of this overnight after surgery, call the next morning for a refill.    Prescriptions will not be filled after 5pm or on week-ends. Take your usually prescribed medications unless otherwise directed You should eat very light the first 24 hours after surgery, such as soup, crackers, pudding, etc.  Resume your normal diet the day after surgery. Most patients will experience some swelling and bruising in the breast.  Ice packs and a good support bra will help.  Swelling and bruising can take several days to resolve.  It is common to experience some constipation if taking pain medication after surgery.  Increasing fluid intake and taking a stool softener will usually help or prevent this problem from occurring.  A mild laxative (Milk of Magnesia or Miralax) should be taken according to package directions if there are no bowel movements after 48 hours. Unless discharge instructions indicate otherwise, you may remove your bandages 48 hours after surgery, and you may shower at that time.  You may have steri-strips (small skin tapes) in place directly over the incision.  These strips should be left on the skin at least for for 7-10 days.    ACTIVITIES:  You may resume regular daily activities (gradually increasing) beginning the next day.  Wearing a  good support bra or sports bra (or the breast binder) minimizes pain and swelling.  You may have sexual intercourse when it is comfortable. No heavy lifting for 1-2 weeks (not over around 10 pounds).  You may drive when you no longer are taking prescription pain medication, you can comfortably wear a seatbelt, and you can safely maneuver your car and apply brakes. RETURN TO WORK:  __________3-14 days depending on job. _______________ Rosine should see your doctor in the office for a follow-up appointment approximately two weeks after your surgery.  Your doctor's nurse will typically make your follow-up appointment when she calls you with your pathology report.  Expect your pathology report 3-4 business days after your surgery.  You may call to check if you do not hear from us  after three days.   WHEN TO CALL YOUR DOCTOR: Fever over 101.0 Nausea and/or vomiting. Extreme swelling or bruising. Continued bleeding from incision. Increased pain, redness, or drainage from the incision.  The clinic staff is available to answer your questions during regular business hours.  Please don't hesitate to call and ask to speak to one of the nurses for clinical concerns.  If you have a medical emergency, go to the nearest emergency room or call 911.  A surgeon from [facility] is always on call at the hospital.  For further questions, please visit [email or web address]     Post Anesthesia Home Care Instructions  Activity: Get plenty of rest for the remainder of the day. A responsible individual must  stay with you for 24 hours following the procedure.  For the next 24 hours, DO NOT: -Drive a car -Advertising copywriter -Drink alcoholic beverages -Take any medication unless instructed by your physician -Make any legal decisions or sign important papers.  Meals: Start with liquid foods such as gelatin or soup. Progress to regular foods as tolerated. Avoid greasy, spicy, heavy foods. If nausea  and/or vomiting occur, drink only clear liquids until the nausea and/or vomiting subsides. Call your physician if vomiting continues.  Special Instructions/Symptoms: Your throat may feel dry or sore from the anesthesia or the breathing tube placed in your throat during surgery. If this causes discomfort, gargle with warm salt water. The discomfort should disappear within 24 hours.  If you had a scopolamine  patch placed behind your ear for the management of post- operative nausea and/or vomiting:  1. The medication in the patch is effective for 72 hours, after which it should be removed.  Wrap patch in a tissue and discard in the trash. Wash hands thoroughly with soap and water. 2. You may remove the patch earlier than 72 hours if you experience unpleasant side effects which may include dry mouth, dizziness or visual disturbances. 3. Avoid touching the patch. Wash your hands with soap and water after contact with the patch.     No tylenol  until after 2pm

## 2023-10-27 NOTE — Anesthesia Postprocedure Evaluation (Signed)
 Anesthesia Post Note  Patient: Caitlyn Richardson  Procedure(s) Performed: BREAST LUMPECTOMY WITH RADIOACTIVE SEED LOCALIZATION (Right: Breast)     Patient location during evaluation: PACU Anesthesia Type: General Level of consciousness: awake Pain management: pain level controlled Vital Signs Assessment: post-procedure vital signs reviewed and stable Respiratory status: spontaneous breathing, nonlabored ventilation and respiratory function stable Cardiovascular status: blood pressure returned to baseline and stable Postop Assessment: no apparent nausea or vomiting Anesthetic complications: no   No notable events documented.  Last Vitals:  Vitals:   10/27/23 1109 10/27/23 1119  BP: 124/86 137/82  Pulse: 66 62  Resp: 13 16  Temp:  (!) 36.2 C  SpO2: 97% 98%    Last Pain:  Vitals:   10/27/23 1119  PainSc: 2                  Delon Aisha Arch

## 2023-10-27 NOTE — Transfer of Care (Signed)
 Stable to PACU [place] 02 Sao2 99 % Awake responsive

## 2023-10-27 NOTE — Anesthesia Procedure Notes (Signed)
 Procedure Name: LMA Insertion Date/Time: 10/27/2023 9:37 AM  Performed by: Pam Macario BROCKS, CRNAPre-anesthesia Checklist: Patient identified, Emergency Drugs available, Suction available, Patient being monitored and Timeout performed Patient Re-evaluated:Patient Re-evaluated prior to induction Oxygen Delivery Method: Circle system utilized Preoxygenation: Pre-oxygenation with 100% oxygen Induction Type: IV induction Ventilation: Mask ventilation without difficulty LMA: LMA inserted LMA Size: 4.0 Number of attempts: 1 Airway Equipment and Method: Bite block Placement Confirmation: positive ETCO2, breath sounds checked- equal and bilateral and CO2 detector Tube secured with: Tape Dental Injury: Teeth and Oropharynx as per pre-operative assessment

## 2023-10-28 ENCOUNTER — Other Ambulatory Visit (HOSPITAL_BASED_OUTPATIENT_CLINIC_OR_DEPARTMENT_OTHER): Payer: Self-pay

## 2023-10-28 ENCOUNTER — Encounter (HOSPITAL_BASED_OUTPATIENT_CLINIC_OR_DEPARTMENT_OTHER): Payer: Self-pay | Admitting: General Surgery

## 2023-10-28 ENCOUNTER — Other Ambulatory Visit: Payer: Self-pay

## 2023-10-29 ENCOUNTER — Encounter (HOSPITAL_BASED_OUTPATIENT_CLINIC_OR_DEPARTMENT_OTHER): Payer: Self-pay | Admitting: General Surgery

## 2023-10-29 ENCOUNTER — Ambulatory Visit: Payer: Self-pay | Admitting: General Surgery

## 2023-10-29 LAB — SURGICAL PATHOLOGY

## 2023-10-29 NOTE — OR Nursing (Signed)
 Late entry:  Due to an error with procedure names related to the radioactive seeds, this procedure was documented incorrectly the day of surgery. I reviewed the Operative note dictated by the surgeon, and changed the procedure to match.  Berwyn Eagles, RN.

## 2023-11-03 DIAGNOSIS — M25531 Pain in right wrist: Secondary | ICD-10-CM | POA: Diagnosis not present

## 2023-11-05 DIAGNOSIS — M25631 Stiffness of right wrist, not elsewhere classified: Secondary | ICD-10-CM | POA: Diagnosis not present

## 2023-11-11 ENCOUNTER — Other Ambulatory Visit (HOSPITAL_BASED_OUTPATIENT_CLINIC_OR_DEPARTMENT_OTHER): Payer: Self-pay

## 2023-11-11 ENCOUNTER — Telehealth (HOSPITAL_COMMUNITY): Admitting: Family

## 2023-11-11 DIAGNOSIS — F339 Major depressive disorder, recurrent, unspecified: Secondary | ICD-10-CM

## 2023-11-11 DIAGNOSIS — F32A Depression, unspecified: Secondary | ICD-10-CM

## 2023-11-11 DIAGNOSIS — F413 Other mixed anxiety disorders: Secondary | ICD-10-CM

## 2023-11-11 MED ORDER — DESVENLAFAXINE SUCCINATE ER 100 MG PO TB24
100.0000 mg | ORAL_TABLET | Freq: Every day | ORAL | 2 refills | Status: DC
  Filled 2023-11-11: qty 30, 30d supply, fill #0
  Filled 2023-12-07: qty 30, 30d supply, fill #1
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
## 2023-11-12 DIAGNOSIS — M25631 Stiffness of right wrist, not elsewhere classified: Secondary | ICD-10-CM | POA: Diagnosis not present

## 2023-11-17 DIAGNOSIS — M25631 Stiffness of right wrist, not elsewhere classified: Secondary | ICD-10-CM | POA: Diagnosis not present

## 2023-11-26 DIAGNOSIS — M25631 Stiffness of right wrist, not elsewhere classified: Secondary | ICD-10-CM | POA: Diagnosis not present

## 2023-12-01 ENCOUNTER — Encounter (HOSPITAL_BASED_OUTPATIENT_CLINIC_OR_DEPARTMENT_OTHER): Payer: Self-pay

## 2023-12-01 ENCOUNTER — Other Ambulatory Visit (HOSPITAL_BASED_OUTPATIENT_CLINIC_OR_DEPARTMENT_OTHER): Payer: Self-pay

## 2023-12-01 ENCOUNTER — Emergency Department (HOSPITAL_BASED_OUTPATIENT_CLINIC_OR_DEPARTMENT_OTHER): Admitting: Radiology

## 2023-12-01 ENCOUNTER — Ambulatory Visit: Payer: 59 | Admitting: Dermatology

## 2023-12-01 ENCOUNTER — Other Ambulatory Visit: Payer: Self-pay

## 2023-12-01 ENCOUNTER — Emergency Department (HOSPITAL_BASED_OUTPATIENT_CLINIC_OR_DEPARTMENT_OTHER)
Admission: EM | Admit: 2023-12-01 | Discharge: 2023-12-01 | Disposition: A | Discharge status: Home / Self Care | Attending: Emergency Medicine | Admitting: Emergency Medicine

## 2023-12-01 DIAGNOSIS — M25551 Pain in right hip: Secondary | ICD-10-CM | POA: Diagnosis not present

## 2023-12-01 DIAGNOSIS — A419 Sepsis, unspecified organism: Secondary | ICD-10-CM | POA: Diagnosis not present

## 2023-12-01 DIAGNOSIS — M5441 Lumbago with sciatica, right side: Secondary | ICD-10-CM | POA: Diagnosis not present

## 2023-12-01 DIAGNOSIS — Z79899 Other long term (current) drug therapy: Secondary | ICD-10-CM | POA: Insufficient documentation

## 2023-12-01 DIAGNOSIS — M5431 Sciatica, right side: Secondary | ICD-10-CM

## 2023-12-01 DIAGNOSIS — I10 Essential (primary) hypertension: Secondary | ICD-10-CM | POA: Diagnosis not present

## 2023-12-01 DIAGNOSIS — M160 Bilateral primary osteoarthritis of hip: Secondary | ICD-10-CM | POA: Diagnosis not present

## 2023-12-01 DIAGNOSIS — M545 Low back pain, unspecified: Secondary | ICD-10-CM | POA: Diagnosis present

## 2023-12-01 MED ORDER — METHOCARBAMOL 500 MG PO TABS
500.0000 mg | ORAL_TABLET | Freq: Two times a day (BID) | ORAL | 0 refills | Status: DC
  Filled 2023-12-01: qty 20, 10d supply, fill #0

## 2023-12-01 MED ORDER — PREDNISONE 20 MG PO TABS
40.0000 mg | ORAL_TABLET | Freq: Every day | ORAL | 0 refills | Status: DC
Start: 2023-12-01 — End: 2023-12-09
  Filled 2023-12-01: qty 10, 5d supply, fill #0

## 2023-12-01 NOTE — ED Provider Notes (Signed)
 [place] EMERGENCY DEPARTMENT AT [facility] Provider Note   CSN: [id number] Arrival date & time: 12/01/23  1153     Patient presents with: Back Pain   Caitlyn Richardson is a 43 y.o. female with past medical history significant for anxiety, degenerative disease, hypertension, lumbosacral spondylosis with myelopathy, hyperlipidemia, who presents with concern for acute onset right sided low back pain radiating down her right leg.  She reports it feels somewhat similar to when she had sciatica on the left.  She reports she ended up needing discectomy by Dr. Gillie.  She has been trying ibuprofen , and some Percocet that she had at home.  She denies any injury.  She denies any loss control of bladder, bowels, she denies any groin numbness.  She reports she cannot walk but that it is difficult.    Back Pain      Prior to Admission medications   Medication Sig Start Date End Date Taking? Authorizing Provider  methocarbamol  (ROBAXIN ) 500 MG tablet Take 1 tablet (500 mg total) by mouth 2 (two) times daily. 12/01/23  Yes Aimi Essner H, PA-C  predniSONE  (DELTASONE ) 20 MG tablet Take 2 tablets (40 mg total) by mouth daily. 12/01/23  Yes Cristofer Yaffe H, PA-C  ALPRAZolam  (XANAX ) 0.5 MG tablet Take 1/2 - 1 tablet by mouth at bedtime as needed for anxiety or sleep. 06/11/23   de [place], Raymond J, MD  amLODipine -olmesartan  (AZOR ) 10-40 MG tablet Take 1 tablet by mouth daily. 04/14/23   Vannie Reche RAMAN, NP  cholecalciferol  (VITAMIN D3) 25 MCG (1000 UNIT) tablet Take 1 tablet (1,000 Units total) by mouth daily. 08/03/23   [place], Raymond J, MD  desvenlafaxine  (PRISTIQ ) 100 MG 24 hr tablet Take 1 tablet (100 mg total) by mouth daily. 11/11/23   Ezzard Staci SAILOR, NP  HYDROcodone -acetaminophen  (NORCO/VICODIN) 5-325 MG tablet Take 1 tablet every 4-6 hours by mouth as needed for pain for 5 days. 10/05/23     hydrOXYzine  (VISTARIL ) 25 MG capsule Take 1 capsule (25 mg total) by mouth at bedtime as  needed and may repeat dose one time if needed. 09/07/23   Ezzard Staci SAILOR, NP  ketoconazole  (NIZORAL ) 2 % cream Apply 1 Application topically 2 (two) times daily. 06/11/23 12/08/23  Alm Delon SAILOR, DO  lidocaine  (XYLOCAINE ) 5 % ointment Apply 1 Application topically 3 (three) times daily as needed. Use as needed for hemorrhoid pain 12/30/22   Cleotilde Ronal RAMAN, MD  oxyCODONE  (OXY IR/ROXICODONE ) 5 MG immediate release tablet Take 1 tablet (5 mg total) by mouth every 6 (six) hours as needed for severe pain (pain score 7-10). 10/27/23   Aron Shoulders, MD  Safety Seal Miscellaneous MISC Medication Name: Melaxemic Cream made with Tranexamic Acid 5%, Kojic Acid USP 2%, Vit C USP 2.5%, Tretinoin USP 0.025% Patient taking differently: Apply 1 Application topically every other day. Medication Name: Melaxemic Cream made with Tranexamic Acid 5%, Kojic Acid USP 2%, Vit C USP 2.5%, Tretinoin USP 0.025% 03/11/23   Alm Delon SAILOR, DO  spironolactone  (ALDACTONE ) 25 MG tablet Take 0.5 tablets (12.5 mg total) by mouth daily. 04/20/23 02/01/24  Vannie Reche RAMAN, NP  tacrolimus  (PROTOPIC ) 0.1 % ointment Apply topically 2 (two) times daily. Apply to affected area under right eye Patient taking differently: Apply 1 Application topically every other day. 01/28/23   Alm Delon SAILOR, DO    Allergies: Patient has no known allergies.    Review of Systems  Musculoskeletal:  Positive for back pain.  All other systems  reviewed and are negative.   Updated Vital Signs BP 137/78 (BP Location: Right Arm)   Pulse 77   Temp 98.5 F (36.9 C) (Oral)   Resp 16   LMP 09/22/2021 (Approximate)   SpO2 100%   Physical Exam Vitals and nursing note reviewed.  Constitutional:      General: She is not in acute distress.    Appearance: Normal appearance.  HENT:     Head: Normocephalic and atraumatic.  Eyes:     General:        Right eye: No discharge.        Left eye: No discharge.  Cardiovascular:     Rate and Rhythm: Normal rate  and regular rhythm.  Pulmonary:     Effort: Pulmonary effort is normal.     Breath sounds: Normal breath sounds.  Abdominal:     General: Bowel sounds are normal.     Palpations: Abdomen is soft.  Musculoskeletal:     Comments: Intact strength 5/5 of bilateral lower extremities.  She has some lumbar paraspinous muscle tenderness on the right, no significant midline spinal tenderness.  She is able to stand and ambulate but with some difficulty secondary to pain.  Skin:    General: Skin is warm and dry.     Capillary Refill: Capillary refill takes less than 2 seconds.  Neurological:     Mental Status: She is alert and oriented to person, place, and time.  Psychiatric:        Mood and Affect: Mood normal.        Behavior: Behavior normal.     (all labs ordered are listed, but only abnormal results are displayed) Labs Reviewed - No data to display  EKG: None  Radiology: No results found.   Procedures   Medications Ordered in the ED - No data to display                                  Medical Decision Making Amount and/or Complexity of Data Reviewed Radiology: ordered.   Patient with back pain.  My emergent differential diagnosis includes slipped disc, compression fracture, spondylolisthesis, less clinical concern for epidural abscess or osteomyelitis based on patient history.  Overall with high clinical suspicion for lumbar sprain, sciatica based on clinical presentation, risk factors.  No neurological deficits. Patient is ambulatory. No warning symptoms of back pain including: fecal incontinence, urinary retention or overflow incontinence, night sweats, waking from sleep with back pain, unexplained fevers or weight loss, h/o cancer, IVDU, recent trauma. Overall low clinical concern for cauda equina, epidural abscess, or other serious cause of back pain.  I independently interpreted plain film radiograph of the right hip which shows no evidence of acute fracture, dislocation or  other significant abnormality.  Conservative measures such as rest, ice/heat, ibuprofen , Tylenol , and  prescription for Robaxin  indicated with orthopedic follow-up if no improvement with conservative management.  Extensive return precautions given, patient discharged in stable condition at this time.   Final diagnoses:  Sciatica of right side    ED Discharge Orders          Ordered    predniSONE  (DELTASONE ) 20 MG tablet  Daily        12/01/23 1258    methocarbamol  (ROBAXIN ) 500 MG tablet  2 times daily        12/01/23 1258  Rosan Sherlean DEL, PA-C 12/01/23 1303    Mannie Pac T, DO 12/02/23 2312

## 2023-12-01 NOTE — Discharge Instructions (Signed)

## 2023-12-01 NOTE — ED Triage Notes (Signed)
 Pt c/o R lower back pain/ stiffness onset waking Friday, went to the beach, the long drive likely agitated it. Advises intermittent radiation into R leg/ wraps around into the front, but that feels more musculoskeletal than the back

## 2023-12-02 DIAGNOSIS — M25631 Stiffness of right wrist, not elsewhere classified: Secondary | ICD-10-CM | POA: Diagnosis not present

## 2023-12-07 ENCOUNTER — Other Ambulatory Visit (HOSPITAL_BASED_OUTPATIENT_CLINIC_OR_DEPARTMENT_OTHER): Payer: Self-pay

## 2023-12-09 ENCOUNTER — Ambulatory Visit (INDEPENDENT_AMBULATORY_CARE_PROVIDER_SITE_OTHER): Admitting: Family Medicine

## 2023-12-09 ENCOUNTER — Other Ambulatory Visit (HOSPITAL_BASED_OUTPATIENT_CLINIC_OR_DEPARTMENT_OTHER): Payer: Self-pay

## 2023-12-09 ENCOUNTER — Ambulatory Visit (HOSPITAL_BASED_OUTPATIENT_CLINIC_OR_DEPARTMENT_OTHER): Payer: 59 | Admitting: Family Medicine

## 2023-12-09 VITALS — BP 117/79 | HR 67 | Ht 66.0 in | Wt 204.4 lb

## 2023-12-09 DIAGNOSIS — R221 Localized swelling, mass and lump, neck: Secondary | ICD-10-CM | POA: Diagnosis not present

## 2023-12-09 DIAGNOSIS — R220 Localized swelling, mass and lump, head: Secondary | ICD-10-CM

## 2023-12-09 DIAGNOSIS — R42 Dizziness and giddiness: Secondary | ICD-10-CM | POA: Diagnosis not present

## 2023-12-09 DIAGNOSIS — M545 Low back pain, unspecified: Secondary | ICD-10-CM | POA: Insufficient documentation

## 2023-12-09 DIAGNOSIS — Z0000 Encounter for general adult medical examination without abnormal findings: Secondary | ICD-10-CM

## 2023-12-09 MED ORDER — CYCLOBENZAPRINE HCL 5 MG PO TABS
5.0000 mg | ORAL_TABLET | Freq: Three times a day (TID) | ORAL | 1 refills | Status: DC | PRN
  Filled 2023-12-09: qty 30, 10d supply, fill #0

## 2023-12-09 MED ORDER — MELOXICAM 7.5 MG PO TABS
7.5000 mg | ORAL_TABLET | Freq: Every day | ORAL | 0 refills | Status: AC
  Filled 2023-12-09: qty 30, 30d supply, fill #0

## 2023-12-09 NOTE — Progress Notes (Signed)
 Procedures performed today:    None.  Independent interpretation of notes and tests performed by another provider:   None.  Brief History, Exam, Impression, and Recommendations:    BP 117/79 (BP Location: Right Arm, Patient Position: Sitting, Cuff Size: Large)   Pulse 67   Ht 5' 6 (1.676 m)   Wt 204 lb 6.4 oz (92.7 kg)   LMP 09/22/2021 (Approximate)   SpO2 99%   BMI 32.99 kg/m   Submandibular swelling Assessment & Plan: Repeat ultrasound did show persistent swelling/likely lymph nodes in the area of concern.  Appearance of lymph nodes were not necessarily concerning, however they have been present for longer duration.  Ultimately, consideration was for potential evaluation with ENT given history, we discussed further today and the patient would like to proceed with referral to ENT for further evaluation.  I feel that this is reasonable, referral placed today  Orders: -     Ambulatory referral to ENT  Right-sided low back pain without sciatica, unspecified chronicity Assessment & Plan: History of left-sided low back pain with sciatica, however has been having more recent issue with right low back pain.  She denies any radiation of symptoms.  She was seen in the emergency department recently and was prescribed muscle relaxer as well as prednisone .  She still has symptoms presently, did finish up course of steroids.  She does continue with muscle laxer, however does not find it to be very beneficial.  Denies any associated numbness or tingling.  At time of ED evaluation, she was referred to neurosurgeon, she has not heard from office yet to be able to arrange appointment We discussed considerations today.  Given location of pain and symptoms, recommend continuing with medication management.  Would hold off on further steroid use at this time, however can utilize NSAID, we discussed options, prescription sent for meloxicam .  We also discussed use of alternative muscle relaxer if  preferred, prescription sent for Flexeril , discussed potential risks and side effects with medication.  I do feel that she would also benefit from physical therapy, patient amenable, referral placed today.  Can also continue with referral to neurosurgeon, advised if she has difficulty arranging for appointment, to let us  know and we can try to assist with referral process.  Orders: -     Ambulatory referral to Physical Therapy  Dizziness Assessment & Plan: She reports having short-lived, infrequent dizziness episodes, also described as feeling swimmy headed.  These will last for few seconds and then resolved.  She denies any interval symptoms.  Denies any associated chest pain, shortness of breath, palpitations, feeling as though she will pass out or episodes of passing out.  Has not found it to be associated with any positional changes or activities. On exam, patient is in no acute distress, vital signs stable, cardiovascular Sam with regular rate and rhythm, lungs clear to auscultation bilaterally.  Extraocular movements intact.  Normal gait in office. We discussed considerations, given history and exam, I do feel it is less likely related to cardiovascular etiology.  Potentially related to vestibular dysfunction, middle ear issue.  Given that we will be proceeding with referral to ENT, advised that this would also be something that we could obtain input from ENT as well.  Patient amenable to this  Orders: -     Ambulatory referral to ENT  Wellness examination -     CBC with Differential/Platelet; Future -     Comprehensive metabolic panel with GFR; Future -  Hemoglobin A1c; Future -     Lipid panel; Future -     TSH Rfx on Abnormal to Free T4; Future  Other orders -     Meloxicam ; Take 1 tablet (7.5 mg total) by mouth daily.  Dispense: 30 tablet; Refill: 0 -     Cyclobenzaprine  HCl; Take 1 tablet (5 mg total) by mouth 3 (three) times daily as needed for muscle spasms.  Dispense: 30  tablet; Refill: 1  Return in about 6 months (around 06/10/2024) for CPE with fasting labs 1 week prior.   ___________________________________________ Gerianne Simonet de Peru, MD, ABFM, CAQSM Primary Care and Sports Medicine [facility]

## 2023-12-09 NOTE — Assessment & Plan Note (Signed)
 Repeat ultrasound did show persistent swelling/likely lymph nodes in the area of concern.  Appearance of lymph nodes were not necessarily concerning, however they have been present for longer duration.  Ultimately, consideration was for potential evaluation with ENT given history, we discussed further today and the patient would like to proceed with referral to ENT for further evaluation.  I feel that this is reasonable, referral placed today

## 2023-12-09 NOTE — Assessment & Plan Note (Signed)
 She reports having short-lived, infrequent dizziness episodes, also described as feeling swimmy headed.  These will last for few seconds and then resolved.  She denies any interval symptoms.  Denies any associated chest pain, shortness of breath, palpitations, feeling as though she will pass out or episodes of passing out.  Has not found it to be associated with any positional changes or activities. On exam, patient is in no acute distress, vital signs stable, cardiovascular Sam with regular rate and rhythm, lungs clear to auscultation bilaterally.  Extraocular movements intact.  Normal gait in office. We discussed considerations, given history and exam, I do feel it is less likely related to cardiovascular etiology.  Potentially related to vestibular dysfunction, middle ear issue.  Given that we will be proceeding with referral to ENT, advised that this would also be something that we could obtain input from ENT as well.  Patient amenable to this

## 2023-12-09 NOTE — Assessment & Plan Note (Signed)
 History of left-sided low back pain with sciatica, however has been having more recent issue with right low back pain.  She denies any radiation of symptoms.  She was seen in the emergency department recently and was prescribed muscle relaxer as well as prednisone .  She still has symptoms presently, did finish up course of steroids.  She does continue with muscle laxer, however does not find it to be very beneficial.  Denies any associated numbness or tingling.  At time of ED evaluation, she was referred to neurosurgeon, she has not heard from office yet to be able to arrange appointment We discussed considerations today.  Given location of pain and symptoms, recommend continuing with medication management.  Would hold off on further steroid use at this time, however can utilize NSAID, we discussed options, prescription sent for meloxicam .  We also discussed use of alternative muscle relaxer if preferred, prescription sent for Flexeril , discussed potential risks and side effects with medication.  I do feel that she would also benefit from physical therapy, patient amenable, referral placed today.  Can also continue with referral to neurosurgeon, advised if she has difficulty arranging for appointment, to let us  know and we can try to assist with referral process.

## 2023-12-09 NOTE — Patient Instructions (Signed)
  Medication Instructions:  Your physician recommends that you continue on your current medications as directed. Please refer to the Current Medication list given to you today. --If you need a refill on any your medications before your next appointment, please call your pharmacy first. If no refills are authorized on file call the office.-- Lab Work: Your physician has recommended that you have lab work today: 1 week before next visit  If you have labs (blood work) drawn today and your tests are completely normal, you will receive your results via MyChart message OR a phone call from our staff.  Please ensure you check your voicemail in the event that you authorized detailed messages to be left on a delegated number. If you have any lab test that is abnormal or we need to change your treatment, we will call you to review the results.  Referrals/Procedures/Imaging: Referrals placed   Follow-Up: Your next appointment:   Your physician recommends that you schedule a follow-up appointment in: 6 months physical with Dr. de Peru  You will receive a text message or e-mail with a link to a survey about your care and experience with [place] today! We would greatly appreciate your feedback!   Thanks for letting us be apart of your health journey!!  Primary Care and Sports Medicine   Dr. Ceasar Mons Peru   We encourage you to activate your patient portal called "MyChart".  Sign up information is provided on this After Visit Summary.  MyChart is used to connect with patients for Virtual Visits (Telemedicine).  Patients are able to view lab/test results, encounter notes, upcoming appointments, etc.  Non-urgent messages can be sent to your provider as well. To learn more about what you can do with MyChart, please visit --  [email or web address].

## 2023-12-10 ENCOUNTER — Ambulatory Visit: Admitting: Physical Therapy
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
## 2023-12-14 DIAGNOSIS — H40023 Open angle with borderline findings, high risk, bilateral: Secondary | ICD-10-CM | POA: Diagnosis not present

## 2023-12-14 DIAGNOSIS — H35412 Lattice degeneration of retina, left eye: Secondary | ICD-10-CM | POA: Diagnosis not present

## 2023-12-14 DIAGNOSIS — H5213 Myopia, bilateral: Secondary | ICD-10-CM | POA: Diagnosis not present

## 2023-12-14 DIAGNOSIS — H04123 Dry eye syndrome of bilateral lacrimal glands: Secondary | ICD-10-CM | POA: Diagnosis not present

## 2023-12-17 ENCOUNTER — Ambulatory Visit (INDEPENDENT_AMBULATORY_CARE_PROVIDER_SITE_OTHER)

## 2023-12-17 ENCOUNTER — Encounter (HOSPITAL_BASED_OUTPATIENT_CLINIC_OR_DEPARTMENT_OTHER): Payer: Self-pay

## 2023-12-17 ENCOUNTER — Other Ambulatory Visit (HOSPITAL_BASED_OUTPATIENT_CLINIC_OR_DEPARTMENT_OTHER): Payer: Self-pay

## 2023-12-17 VITALS — BP 131/71 | HR 72 | Ht 66.5 in | Wt 205.4 lb

## 2023-12-17 DIAGNOSIS — N390 Urinary tract infection, site not specified: Secondary | ICD-10-CM

## 2023-12-17 DIAGNOSIS — R300 Dysuria: Secondary | ICD-10-CM | POA: Diagnosis not present

## 2023-12-17 DIAGNOSIS — R350 Frequency of micturition: Secondary | ICD-10-CM | POA: Diagnosis not present

## 2023-12-17 LAB — POCT URINALYSIS DIPSTICK
Bilirubin, UA: NEGATIVE
Glucose, UA: NEGATIVE
Ketones, UA: NEGATIVE
Nitrite, UA: POSITIVE
Protein, UA: POSITIVE — AB
Spec Grav, UA: 1.02 (ref 1.010–1.025)
Urobilinogen, UA: 2 U/dL — AB
pH, UA: 7 (ref 5.0–8.0)

## 2023-12-17 MED ORDER — PHENAZOPYRIDINE HCL 200 MG PO TABS
200.0000 mg | ORAL_TABLET | Freq: Three times a day (TID) | ORAL | 0 refills | Status: DC | PRN
  Filled 2023-12-17: qty 6, 2d supply, fill #0

## 2023-12-17 MED ORDER — NITROFURANTOIN MONOHYD MACRO 100 MG PO CAPS
100.0000 mg | ORAL_CAPSULE | Freq: Two times a day (BID) | ORAL | 0 refills | Status: DC
  Filled 2023-12-17: qty 10, 5d supply, fill #0

## 2023-12-17 NOTE — Progress Notes (Deleted)
 NURSE VISIT- BLOOD PRESSURE CHECK  SUBJECTIVE:  Caitlyn Richardson is a 43 y.o. [phone] female here for BP check. She {Blank single:19197::is Unknown pregnant,is postpartum, delivery date ***,is a GYN patient}    HYPERTENSION ROS:  Pregnant/postpartum:  Severe headaches that don't go away with tylenol /other medicines: {YES/NO:21197} Visual changes (seeing spots/double/blurred vision) {YES/NO:21197} Severe pain under right breast breast or in center of upper chest {YES/NO:21197} Severe nausea/vomiting {YES/NO:21197} Taking medicines as instructed {Response; yes/no/na:63}  GYN patient: Taking medicines as instructed {Response; yes/no/na:63} Headaches  {yes/no:20286} Chest pain {yes/no:20286} Shortness of breath {yes/no:20286} Swelling in legs/ankles {yes/no:20286}  OBJECTIVE:  BP 131/71   Pulse 72   Ht 5' 6.5 (1.689 m)   Wt 205 lb 6.4 oz (93.2 kg)   LMP 09/22/2021 (Approximate)   BMI 32.66 kg/m   Appearance {appearance:315021::alert, well appearing, and in no distress}.  ASSESSMENT: {Blank single:19197::Pregnancy Unknown,Postpartum,GYN}  blood pressure check  PLAN: Discussed with Recommendations: {Blank single:19197::no changes needed,new prescription will be sent,stop medicine 2 days before next visit,check pre-e labs today}   Follow-up: {Blank single:19197::as scheduled,in 2 days,in 2 weeks,in 4 weeks,in 3 months}

## 2023-12-17 NOTE — Progress Notes (Signed)
 NURSE VISIT- UTI SYMPTOMS   SUBJECTIVE:  Caitlyn Richardson is a 43 y.o. [phone] female here for UTI symptoms. She is a GYN patient. She reports dysuria, hematuria, and urinary frequency.  OBJECTIVE:  BP 131/71   Pulse 72   Ht 5' 6.5 (1.689 m)   Wt 205 lb 6.4 oz (93.2 kg)   LMP 09/22/2021 (Approximate)   BMI 32.66 kg/m   Appears well, in no apparent distress  Results for orders placed or performed in visit on 12/17/23 (from the past 24 hours)  POCT Urinalysis Dipstick   Collection Time: 12/17/23  3:15 PM  Result Value Ref Range   Color, UA Yellow    Clarity, UA Clear    Glucose, UA Negative Negative   Bilirubin, UA Negative    Ketones, UA Negative    Spec Grav, UA 1.020 1.010 - 1.025   Blood, UA Moderate    pH, UA 7.0 5.0 - 8.0   Protein, UA Positive (A) Negative   Urobilinogen, UA 2.0 (A) 0.2 or 1.0 E.U./dL   Nitrite, UA Positive    Leukocytes, UA Moderate (2+) (A) Negative   Appearance Yellow    Odor Foul     ASSESSMENT: GYN patient with UTI symptoms and positive nitrites  PLAN: Visit routed to or discussed with:  Rx sent today: Yes Urine culture sent Call or return to clinic prn if these symptoms worsen or fail to improve as anticipated. Follow-up: as needed

## 2023-12-20 LAB — URINE CULTURE

## 2023-12-21 ENCOUNTER — Ambulatory Visit (HOSPITAL_BASED_OUTPATIENT_CLINIC_OR_DEPARTMENT_OTHER): Payer: Self-pay | Admitting: Certified Nurse Midwife

## 2023-12-22 ENCOUNTER — Ambulatory Visit

## 2023-12-28 ENCOUNTER — Telehealth (HOSPITAL_BASED_OUTPATIENT_CLINIC_OR_DEPARTMENT_OTHER): Admitting: Family

## 2023-12-28 ENCOUNTER — Other Ambulatory Visit (HOSPITAL_BASED_OUTPATIENT_CLINIC_OR_DEPARTMENT_OTHER): Payer: Self-pay

## 2023-12-28 DIAGNOSIS — F411 Generalized anxiety disorder: Secondary | ICD-10-CM

## 2023-12-28 DIAGNOSIS — F339 Major depressive disorder, recurrent, unspecified: Secondary | ICD-10-CM

## 2023-12-28 MED ORDER — DESVENLAFAXINE SUCCINATE ER 100 MG PO TB24
100.0000 mg | ORAL_TABLET | Freq: Every day | ORAL | 1 refills | Status: AC
  Filled 2023-12-28 – 2023-12-31 (×2): qty 90, 90d supply, fill #0
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
## 2023-12-31 ENCOUNTER — Other Ambulatory Visit: Payer: Self-pay

## 2023-12-31 ENCOUNTER — Other Ambulatory Visit (HOSPITAL_BASED_OUTPATIENT_CLINIC_OR_DEPARTMENT_OTHER): Payer: Self-pay

## 2024-01-01 ENCOUNTER — Other Ambulatory Visit (HOSPITAL_BASED_OUTPATIENT_CLINIC_OR_DEPARTMENT_OTHER): Payer: Self-pay

## 2024-01-01 ENCOUNTER — Ambulatory Visit (HOSPITAL_BASED_OUTPATIENT_CLINIC_OR_DEPARTMENT_OTHER): Payer: 59 | Admitting: Obstetrics & Gynecology

## 2024-01-01 ENCOUNTER — Other Ambulatory Visit (HOSPITAL_COMMUNITY)
Admission: RE | Admit: 2024-01-01 | Discharge: 2024-01-01 | Disposition: A | Discharge status: Home / Self Care | Source: Ambulatory Visit | Attending: Certified Nurse Midwife | Admitting: Certified Nurse Midwife

## 2024-01-01 ENCOUNTER — Encounter (HOSPITAL_BASED_OUTPATIENT_CLINIC_OR_DEPARTMENT_OTHER): Payer: Self-pay | Admitting: Obstetrics & Gynecology

## 2024-01-01 VITALS — BP 143/73 | HR 70 | Ht 66.5 in | Wt 204.8 lb

## 2024-01-01 DIAGNOSIS — Z90710 Acquired absence of both cervix and uterus: Secondary | ICD-10-CM | POA: Diagnosis not present

## 2024-01-01 DIAGNOSIS — R8290 Unspecified abnormal findings in urine: Secondary | ICD-10-CM | POA: Diagnosis not present

## 2024-01-01 DIAGNOSIS — Z1151 Encounter for screening for human papillomavirus (HPV): Secondary | ICD-10-CM | POA: Diagnosis not present

## 2024-01-01 DIAGNOSIS — Z01419 Encounter for gynecological examination (general) (routine) without abnormal findings: Secondary | ICD-10-CM

## 2024-01-01 DIAGNOSIS — Z124 Encounter for screening for malignant neoplasm of cervix: Secondary | ICD-10-CM | POA: Insufficient documentation

## 2024-01-01 DIAGNOSIS — D241 Benign neoplasm of right breast: Secondary | ICD-10-CM | POA: Diagnosis not present

## 2024-01-01 MED ORDER — FOSFOMYCIN TROMETHAMINE 3 G PO PACK
3.0000 g | PACK | Freq: Once | ORAL | 0 refills | Status: AC
  Filled 2024-01-01: qty 3, 1d supply, fill #0

## 2024-01-01 NOTE — Progress Notes (Signed)
 ANNUAL EXAM Patient name: Ceonna Frazzini MRN [id number]  Date of birth: 1981-02-15 Chief Complaint:   Annual Exam  History of Present Illness:   Zayneb Baucum is a 43 y.o. [phone] African-American female being seen today for a routine annual exam.  Had papilloma removed earlier this year.    Had UTI with culture positive results.  Klebsiella was in the culture.  Treated with macrobid  bid x 5 days.  Symptoms are better but not fully resolved.  She does have urine odor and had a bright yellow color which is not common for her.    Denies vaginal bleeding.    Patient's last menstrual period was 09/22/2021 (approximate).   Last pap 02/14/2021. Results were: NILM w/ HRHPV negative. H/O abnormal pap: yes AGUS 2008 Last mammogram: 03/19/2023.Results were: abnormal birads 4: suspicious. Family h/o breast cancer: no Last colonoscopy: N/A. Family h/o colorectal cancer: no     12/09/2023    8:08 AM 09/07/2023    9:45 AM 06/11/2023    2:28 PM 03/12/2023   10:09 AM 03/06/2023    9:37 AM  Depression screen PHQ 2/9  Decreased Interest 0  0 0 0  Down, Depressed, Hopeless 1  2 3  0  PHQ - 2 Score 1  2 3  0  Altered sleeping 1  2 1    Tired, decreased energy 1  3 1    Change in appetite 0  0 0   Feeling bad or failure about yourself  0  1 1   Trouble concentrating 3  3 3    Moving slowly or fidgety/restless 0  0 0   Suicidal thoughts 1  0 0   PHQ-9 Score 7  11 9    Difficult doing work/chores Somewhat difficult  Somewhat difficult Somewhat difficult      Information is confidential and restricted. Go to Review Flowsheets to unlock data.        12/09/2023    8:09 AM 06/11/2023    2:28 PM 03/12/2023   10:10 AM 12/04/2022    1:25 PM  GAD 7 : Generalized Anxiety Score  Nervous, Anxious, on Edge 1 2 3 2   Control/stop worrying 3 3 3 2   Worry too much - different things 3 3 3 3   Trouble relaxing 0 2 0 1  Restless 0 0 0 0  Easily annoyed or irritable 1 2 2 2   Afraid - awful might happen 0 1 1 1   Total GAD  7 Score 8 13 12 11   Anxiety Difficulty Somewhat difficult Somewhat difficult Somewhat difficult Very difficult    Review of Systems:   Pertinent items are noted in HPI Denies any pelvic issues.  H/o chronic constipation. Urinary issues as per above.   Pertinent History Reviewed:  Reviewed past medical,surgical, social and family history.  Reviewed problem list, medications and allergies. Physical Assessment:   Vitals:   01/01/24 0842  BP: (!) 143/73  Pulse: 70  SpO2: 100%  Weight: 204 lb 12.8 oz (92.9 kg)  Height: 5' 6.5 (1.689 m)  Body mass index is 32.56 kg/m.        Physical Examination:   General appearance - well appearing, and in no distress  Mental status - alert, oriented to person, place, and time  Psych:  She has a normal mood and affect  Skin - warm and dry, normal color, no suspicious lesions noted  Chest - effort normal, all lung fields clear to auscultation bilaterally  Heart - normal rate and regular rhythm  Neck:  midline trachea, no thyromegaly or nodules  Breasts - breasts appear normal, no suspicious masses, no skin or nipple changes or  axillary nodes  Abdomen - soft, nontender, nondistended, no masses or organomegaly  Pelvic - VULVA: normal appearing vulva with no masses, tenderness or lesions   VAGINA: normal appearing vagina with normal color and discharge, no lesions   CERVIX: surgically absent  Thin prep pap is not indicated  UTERUS: surgically absent   ADNEXA: No adnexal masses or tenderness noted.  Rectal - normal rectal, good sphincter tone, no masses felt.   Extremities:  No swelling or varicosities noted  Chaperone present for exam  No results found for this or any previous visit (from the past 24 hours).  Assessment & Plan:  1. Well woman exam with routine gynecological exam (Primary) - Pap smear obtained  - Mammogram 03/2023.  Had papilloma removed in July.  - Colonoscopy discussed - lab work done with PCP, Dr. Penne - vaccines  reviewed/updated  2. Abnormal urine odor - Urine Culture  3. H/O: hysterectomy  4. Cervical cancer screening - Cytology - PAP( [place])  5. Intraductal papilloma of right breast   Orders Placed This Encounter  Procedures   Urine Culture    Meds:  Meds ordered this encounter  Medications   fosfomycin (MONUROL ) 3 g PACK    Sig: Take 3 g by mouth once for 1 dose.    Dispense:  3 g    Refill:  0    Follow-up: Return in about 1 year (around 12/31/2024).  Ronal GORMAN Pinal, MD 01/01/2024 9:33 AM

## 2024-01-03 LAB — URINE CULTURE: Organism ID, Bacteria: NO GROWTH

## 2024-01-05 ENCOUNTER — Ambulatory Visit (HOSPITAL_BASED_OUTPATIENT_CLINIC_OR_DEPARTMENT_OTHER): Payer: Self-pay | Admitting: Obstetrics & Gynecology

## 2024-01-05 ENCOUNTER — Other Ambulatory Visit (HOSPITAL_BASED_OUTPATIENT_CLINIC_OR_DEPARTMENT_OTHER): Payer: Self-pay

## 2024-01-05 DIAGNOSIS — B9689 Other specified bacterial agents as the cause of diseases classified elsewhere: Secondary | ICD-10-CM

## 2024-01-05 LAB — CYTOLOGY - PAP
Comment: NEGATIVE
Diagnosis: NEGATIVE
High risk HPV: NEGATIVE

## 2024-01-08 ENCOUNTER — Other Ambulatory Visit (HOSPITAL_BASED_OUTPATIENT_CLINIC_OR_DEPARTMENT_OTHER): Payer: Self-pay

## 2024-01-11 ENCOUNTER — Institutional Professional Consult (permissible substitution) (INDEPENDENT_AMBULATORY_CARE_PROVIDER_SITE_OTHER)

## 2024-01-14 ENCOUNTER — Other Ambulatory Visit (HOSPITAL_BASED_OUTPATIENT_CLINIC_OR_DEPARTMENT_OTHER): Payer: Self-pay

## 2024-01-14 MED ORDER — METRONIDAZOLE 500 MG PO TABS
500.0000 mg | ORAL_TABLET | Freq: Two times a day (BID) | ORAL | 0 refills | Status: AC
  Filled 2024-01-14: qty 14, 7d supply, fill #0

## 2024-02-01 ENCOUNTER — Encounter (INDEPENDENT_AMBULATORY_CARE_PROVIDER_SITE_OTHER): Payer: Self-pay

## 2024-02-11 ENCOUNTER — Emergency Department (HOSPITAL_BASED_OUTPATIENT_CLINIC_OR_DEPARTMENT_OTHER): Payer: Self-pay

## 2024-02-11 ENCOUNTER — Emergency Department (HOSPITAL_BASED_OUTPATIENT_CLINIC_OR_DEPARTMENT_OTHER)
Admission: EM | Admit: 2024-02-11 | Discharge: 2024-02-11 | Disposition: A | Discharge status: Home / Self Care | Payer: Self-pay | Attending: Emergency Medicine | Admitting: Emergency Medicine

## 2024-02-11 ENCOUNTER — Other Ambulatory Visit (HOSPITAL_BASED_OUTPATIENT_CLINIC_OR_DEPARTMENT_OTHER): Payer: Self-pay

## 2024-02-11 ENCOUNTER — Encounter (HOSPITAL_BASED_OUTPATIENT_CLINIC_OR_DEPARTMENT_OTHER): Payer: Self-pay

## 2024-02-11 ENCOUNTER — Other Ambulatory Visit: Payer: Self-pay

## 2024-02-11 DIAGNOSIS — M62838 Other muscle spasm: Secondary | ICD-10-CM | POA: Insufficient documentation

## 2024-02-11 DIAGNOSIS — Z79899 Other long term (current) drug therapy: Secondary | ICD-10-CM | POA: Insufficient documentation

## 2024-02-11 DIAGNOSIS — I10 Essential (primary) hypertension: Secondary | ICD-10-CM | POA: Insufficient documentation

## 2024-02-11 LAB — CBC WITH DIFFERENTIAL/PLATELET
Abs Immature Granulocytes: 0.02 K/uL (ref 0.00–0.07)
Basophils Absolute: 0 K/uL (ref 0.0–0.1)
Basophils Relative: 1 %
Eosinophils Absolute: 0.1 K/uL (ref 0.0–0.5)
Eosinophils Relative: 2 %
HCT: 37.5 % (ref 36.0–46.0)
Hemoglobin: 12.7 g/dL (ref 12.0–15.0)
Immature Granulocytes: 0 %
Lymphocytes Relative: 22 %
Lymphs Abs: 1.6 K/uL (ref 0.7–4.0)
MCH: 29.7 pg (ref 26.0–34.0)
MCHC: 33.9 g/dL (ref 30.0–36.0)
MCV: 87.8 fL (ref 80.0–100.0)
Monocytes Absolute: 0.6 K/uL (ref 0.1–1.0)
Monocytes Relative: 8 %
Neutro Abs: 4.9 K/uL (ref 1.7–7.7)
Neutrophils Relative %: 67 %
Platelets: 368 K/uL (ref 150–400)
RBC: 4.27 MIL/uL (ref 3.87–5.11)
RDW: 12.3 % (ref 11.5–15.5)
WBC: 7.2 K/uL (ref 4.0–10.5)
nRBC: 0 % (ref 0.0–0.2)

## 2024-02-11 LAB — BASIC METABOLIC PANEL WITH GFR
Anion gap: 14 (ref 5–15)
BUN: 11 mg/dL (ref 6–20)
CO2: 23 mmol/L (ref 22–32)
Calcium: 10.7 mg/dL — ABNORMAL HIGH (ref 8.9–10.3)
Chloride: 103 mmol/L (ref 98–111)
Creatinine, Ser: 0.63 mg/dL (ref 0.44–1.00)
GFR, Estimated: >60 mL/min (ref >60)
Glucose, Bld: 111 mg/dL — ABNORMAL HIGH (ref 70–99)
Potassium: 3.9 mmol/L (ref 3.5–5.1)
Sodium: 139 mmol/L (ref 135–145)

## 2024-02-11 LAB — MAGNESIUM: Magnesium: 2.2 mg/dL (ref 1.7–2.4)

## 2024-02-11 MED ORDER — METHYLPREDNISOLONE 4 MG PO TBPK
ORAL_TABLET | ORAL | 0 refills | Status: AC
  Filled 2024-02-11: qty 21, 6d supply, fill #0

## 2024-02-11 MED ORDER — KETOROLAC TROMETHAMINE 15 MG/ML IJ SOLN
15.0000 mg | Freq: Once | INTRAMUSCULAR | Status: AC
Start: 2024-02-11 — End: 2024-02-11
  Administered 2024-02-11: 15 mg via INTRAVENOUS
  Filled 2024-02-11: qty 1

## 2024-02-11 MED ORDER — CYCLOBENZAPRINE HCL 10 MG PO TABS
10.0000 mg | ORAL_TABLET | Freq: Once | ORAL | Status: AC
  Administered 2024-02-11: 10 mg via ORAL
  Filled 2024-02-11: qty 1

## 2024-02-11 MED ORDER — DEXAMETHASONE SODIUM PHOSPHATE 10 MG/ML IJ SOLN
10.0000 mg | Freq: Once | INTRAMUSCULAR | Status: AC
Start: 2024-02-11 — End: 2024-02-11
  Administered 2024-02-11: 10 mg via INTRAVENOUS
  Filled 2024-02-11: qty 1

## 2024-02-11 MED ORDER — CYCLOBENZAPRINE HCL 10 MG PO TABS
10.0000 mg | ORAL_TABLET | Freq: Two times a day (BID) | ORAL | 0 refills | Status: AC | PRN
  Filled 2024-02-11: qty 20, 10d supply, fill #0

## 2024-02-11 NOTE — ED Notes (Signed)
 DC paperwork given and verbally understood.

## 2024-02-11 NOTE — ED Triage Notes (Signed)
 Right hip pain that radiates down right leg with tingling in right ankle. Pain onset 1 week ago and spasms Sunday. Has not taken tylenol , aleve, or ibuprofen . Took meloxicam  2 ddays without relief.

## 2024-02-11 NOTE — Discharge Instructions (Signed)
 Take next dose of steroid tomorrow.  Follow-up with primary care doctor.  I prescribed you a muscle relaxant Flexeril  to take as needed.  This medication is sedating so please be careful with its use.  Return if symptoms worsen.

## 2024-02-11 NOTE — ED Provider Notes (Signed)
 Caitlyn Richardson   CSN: [id number] Arrival date & time: 02/11/24  9182     Patient presents with: Leg Pain   Caitlyn Richardson is a 43 y.o. female.   Patient here with pain in the right back of the leg hip area for the last week or so.  Feels like she is having muscle spasms as well.  Took meloxicam  with not much relief.  History of sciatica.  Feels little bit different is not really having any back pain.  She feels some tingling in her feet at times.  Denies any loss of bowel or bladder.  History of anemia depression arthritis hypertension and lumbar radiculopathy.  She denies any difficulty walking but does have discomfort and spasms at times.  Denies any chest pain shortness of breath headache.  No fever or chills.  The history is provided by the patient.       Prior to Admission medications   Medication Sig Start Date End Date Taking? Authorizing Provider  cyclobenzaprine  (FLEXERIL ) 10 MG tablet Take 1 tablet (10 mg total) by mouth 2 (two) times daily as needed for muscle spasms. 02/11/24  Yes Ayona Yniguez, DO  methylPREDNISolone  (MEDROL  DOSEPAK) 4 MG TBPK tablet Follow package insert 02/11/24  Yes Florena Kozma, DO  ALPRAZolam  (XANAX ) 0.5 MG tablet Take 1/2 - 1 tablet by mouth at bedtime as needed for anxiety or sleep. 06/11/23   de [place], Raymond J, MD  amLODipine -olmesartan  (AZOR ) 10-40 MG tablet Take 1 tablet by mouth daily. 04/14/23   Vannie Reche RAMAN, NP  cholecalciferol  (VITAMIN D3) 25 MCG (1000 UNIT) tablet Take 1 tablet (1,000 Units total) by mouth daily. 08/03/23   [place], Quintin PARAS, MD  desvenlafaxine  (PRISTIQ ) 100 MG 24 hr tablet Take 1 tablet (100 mg total) by mouth daily. 12/28/23   Ezzard Staci SAILOR, NP  hydrOXYzine  (VISTARIL ) 25 MG capsule Take 1 capsule (25 mg total) by mouth at bedtime as needed and may repeat dose one time if needed. 09/07/23   Ezzard Staci SAILOR, NP  meloxicam  (MOBIC ) 7.5 MG tablet Take 1 tablet (7.5 mg total)  by mouth daily. 12/09/23   [place], Quintin PARAS, MD  metroNIDAZOLE  (FLAGYL ) 500 MG tablet Take 1 tablet (500 mg total) by mouth 2 (two) times daily. 01/14/24   Cleotilde Ronal RAMAN, MD  spironolactone  (ALDACTONE ) 25 MG tablet Take 0.5 tablets (12.5 mg total) by mouth daily. 04/20/23 05/01/24  Vannie Reche RAMAN, NP  tacrolimus  (PROTOPIC ) 0.1 % ointment Apply topically 2 (two) times daily. Apply to affected area under right eye Patient not taking: Reported on 01/01/2024 01/28/23   Alm Delon SAILOR, DO    Allergies: Patient has no known allergies.    Review of Systems  Updated Vital Signs BP (!) 134/90 (BP Location: Right Arm)   Pulse 71   Temp 98.3 F (36.8 C) (Oral)   Resp 18   LMP 09/22/2021 (Approximate)   SpO2 100%   Physical Exam Vitals and nursing Richardson reviewed.  Constitutional:      General: She is not in acute distress.    Appearance: She is well-developed. She is not ill-appearing.  HENT:     Head: Normocephalic and atraumatic.     Nose: Nose normal.     Mouth/Throat:     Mouth: Mucous membranes are moist.  Eyes:     Extraocular Movements: Extraocular movements intact.     Conjunctiva/sclera: Conjunctivae normal.     Pupils: Pupils are equal, round,  and reactive to light.  Cardiovascular:     Rate and Rhythm: Normal rate and regular rhythm.     Pulses: Normal pulses.     Heart sounds: Normal heart sounds. No murmur heard. Pulmonary:     Effort: Pulmonary effort is normal. No respiratory distress.     Breath sounds: Normal breath sounds.  Abdominal:     General: Abdomen is flat.     Palpations: Abdomen is soft.     Tenderness: There is no abdominal tenderness.  Musculoskeletal:        General: Tenderness present. No swelling.     Cervical back: Normal range of motion and neck supple.     Comments: Tenderness to the right posterior leg right hip right gluteal muscles  Skin:    General: Skin is warm and dry.     Capillary Refill: Capillary refill takes less than 2 seconds.   Neurological:     General: No focal deficit present.     Mental Status: She is alert and oriented to person, place, and time.     Cranial Nerves: No cranial nerve deficit.     Motor: No weakness.     Coordination: Coordination normal.     Comments: Decrease sensation over the left side of the right foot over the toe and left lower medial leg with otherwise sensations intact  Psychiatric:        Mood and Affect: Mood normal.     (all labs ordered are listed, but only abnormal results are displayed) Labs Reviewed  BASIC METABOLIC PANEL WITH GFR - Abnormal; Notable for the following components:      Result Value   Glucose, Bld 111 (*)    Calcium 10.7 (*)    All other components within normal limits  CBC WITH DIFFERENTIAL/PLATELET  MAGNESIUM    EKG: None  Radiology: DG Hip Unilat With Pelvis 2-3 Views Right Result Date: 02/11/2024 CLINICAL DATA:  Right hip pain EXAM: DG HIP (WITH OR WITHOUT PELVIS) 2-3V RIGHT COMPARISON:  December 01, 2023 FINDINGS: There is no evidence of hip fracture or dislocation. Mild degenerative osteophytosis about the bilateral hip joints. Pelvic phleboliths. IMPRESSION: No acute osseous findings. Electronically Signed   By: Michaeline Blanch M.D.   On: 02/11/2024 10:02   US  Venous Img Lower Right (DVT Study) Result Date: 02/11/2024 CLINICAL DATA:  Right lower extremity pain EXAM: Right LOWER EXTREMITY VENOUS DOPPLER ULTRASOUND TECHNIQUE: Gray-scale sonography with compression, as well as color and duplex ultrasound, were performed to evaluate the deep venous system(s) from the level of the common femoral vein through the popliteal and proximal calf veins. COMPARISON:  None Available. FINDINGS: VENOUS Normal compressibility of the common femoral, superficial femoral, and popliteal veins, as well as the visualized calf veins. Visualized portions of profunda femoral vein and great saphenous vein unremarkable. No filling defects to suggest DVT on grayscale or color Doppler  imaging. Doppler waveforms show normal direction of venous flow, normal respiratory plasticity and response to augmentation. Limited views of the contralateral common femoral vein are unremarkable. OTHER None. Limitations: none IMPRESSION: Negative. Electronically Signed   By: Michaeline Blanch M.D.   On: 02/11/2024 10:01     Procedures   Medications Ordered in the ED  ketorolac  (TORADOL ) 15 MG/ML injection 15 mg (has no administration in time range)  dexamethasone  (DECADRON ) injection 10 mg (has no administration in time range)  cyclobenzaprine  (FLEXERIL ) tablet 10 mg (10 mg Oral Given 02/11/24 0851)  Medical Decision Making Amount and/or Complexity of Data Reviewed Labs: ordered. Radiology: ordered.  Risk Prescription drug management.   Jillane Po is here with right hip pain tingling pain.  History of sciatica lumbar radiculopathy anxiety disorder hypertension high cholesterol.  Differential diagnosis likely peripheral neuropathy process/muscle spasm versus less likely blood clot.  She got good pulses doubt arterial process.  I have no concern for cauda equina stroke other major neurologic issue or spinal cord issue.  Will check basic labs and electrolytes and get a DVT study and an x-ray of the right hip.  Will give Flexeril  reevaluate.  Per my review interpretation of labs and images is no acute findings.  No significant leukocytosis anemia or electrolyte abnormality.  No DVT.  No fracture.  Will give Decadron  shot here and Toradol .  Will prescribe Medrol  Dosepak and Flexeril .  I do suspect sciatic type process or peripheral nerve type process.  Have no concern for cauda equina or other acute process.  She understands return precautions.  Discharged in good condition.  This chart was dictated using voice recognition software.  Despite best efforts to proofread,  errors can occur which can change the documentation meaning.      Final diagnoses:   Muscle spasm    ED Discharge Orders          Ordered    methylPREDNISolone  (MEDROL  DOSEPAK) 4 MG TBPK tablet        02/11/24 1021    cyclobenzaprine  (FLEXERIL ) 10 MG tablet  2 times daily PRN        02/11/24 1021               Antwon Rochin, DO 02/11/24 1022

## 2024-04-05 ENCOUNTER — Other Ambulatory Visit (HOSPITAL_BASED_OUTPATIENT_CLINIC_OR_DEPARTMENT_OTHER): Payer: Self-pay

## 2024-04-13 ENCOUNTER — Encounter (HOSPITAL_BASED_OUTPATIENT_CLINIC_OR_DEPARTMENT_OTHER): Payer: Self-pay

## 2024-04-13 MED ORDER — AMLODIPINE-OLMESARTAN 10-40 MG PO TABS: 1.0000 | ORAL_TABLET | Freq: Every day | ORAL | 1 refills | Status: AC

## 2024-05-11 ENCOUNTER — Ambulatory Visit: Payer: Self-pay | Admitting: Dermatology

## 2024-05-18 ENCOUNTER — Encounter (HOSPITAL_BASED_OUTPATIENT_CLINIC_OR_DEPARTMENT_OTHER): Payer: Self-pay | Admitting: Obstetrics & Gynecology

## 2024-05-18 ENCOUNTER — Other Ambulatory Visit (HOSPITAL_BASED_OUTPATIENT_CLINIC_OR_DEPARTMENT_OTHER): Payer: Self-pay | Admitting: Obstetrics & Gynecology

## 2024-05-18 DIAGNOSIS — Z1211 Encounter for screening for malignant neoplasm of colon: Secondary | ICD-10-CM

## 2024-06-14 ENCOUNTER — Ambulatory Visit (HOSPITAL_BASED_OUTPATIENT_CLINIC_OR_DEPARTMENT_OTHER): Admitting: Family Medicine

## 2024-06-14 ENCOUNTER — Encounter (HOSPITAL_BASED_OUTPATIENT_CLINIC_OR_DEPARTMENT_OTHER): Admitting: Family Medicine

## 2024-06-28 ENCOUNTER — Ambulatory Visit: Payer: Self-pay | Admitting: Dermatology

## 2025-01-11 ENCOUNTER — Ambulatory Visit (HOSPITAL_BASED_OUTPATIENT_CLINIC_OR_DEPARTMENT_OTHER): Admitting: Obstetrics & Gynecology
